# Patient Record
Sex: Female | Born: 1940 | Race: White | Hispanic: No | Smoking: Former smoker
Health system: Southern US, Community
[De-identification: ages and names within clinical notes are randomized; demographics above are authoritative.]

## PROBLEM LIST (undated history)

## (undated) DIAGNOSIS — Z923 Personal history of irradiation: Secondary | ICD-10-CM

## (undated) DIAGNOSIS — I10 Essential (primary) hypertension: Secondary | ICD-10-CM

## (undated) DIAGNOSIS — Z803 Family history of malignant neoplasm of breast: Secondary | ICD-10-CM

## (undated) DIAGNOSIS — C50919 Malignant neoplasm of unspecified site of unspecified female breast: Principal | ICD-10-CM

## (undated) DIAGNOSIS — H3530 Unspecified macular degeneration: Secondary | ICD-10-CM

## (undated) DIAGNOSIS — E7800 Pure hypercholesterolemia, unspecified: Secondary | ICD-10-CM

## (undated) DIAGNOSIS — Z780 Asymptomatic menopausal state: Secondary | ICD-10-CM

## (undated) HISTORY — DX: Essential (primary) hypertension: I10

## (undated) HISTORY — DX: Asymptomatic menopausal state: Z78.0

## (undated) HISTORY — DX: Family history of malignant neoplasm of breast: Z80.3

## (undated) HISTORY — DX: Pure hypercholesterolemia, unspecified: E78.00

## (undated) HISTORY — DX: Unspecified macular degeneration: H35.30

## (undated) HISTORY — DX: Malignant neoplasm of unspecified site of unspecified female breast: C50.919

## (undated) HISTORY — PX: WISDOM TOOTH EXTRACTION: SHX21

---

## 1998-11-11 DIAGNOSIS — E7800 Pure hypercholesterolemia, unspecified: Secondary | ICD-10-CM

## 1998-11-11 HISTORY — DX: Pure hypercholesterolemia, unspecified: E78.00

## 1998-12-15 ENCOUNTER — Encounter: Admission: RE | Admit: 1998-12-15 | Discharge: 1999-03-15 | Payer: Self-pay | Admitting: Geriatric Medicine

## 1999-10-22 ENCOUNTER — Other Ambulatory Visit: Admission: RE | Admit: 1999-10-22 | Discharge: 1999-10-22 | Payer: Self-pay | Admitting: *Deleted

## 2000-01-07 ENCOUNTER — Encounter: Admission: RE | Admit: 2000-01-07 | Discharge: 2000-04-06 | Payer: Self-pay | Admitting: Geriatric Medicine

## 2000-04-15 ENCOUNTER — Other Ambulatory Visit: Admission: RE | Admit: 2000-04-15 | Discharge: 2000-04-15 | Payer: Self-pay | Admitting: *Deleted

## 2000-06-26 ENCOUNTER — Ambulatory Visit (HOSPITAL_COMMUNITY): Admission: RE | Admit: 2000-06-26 | Discharge: 2000-06-26 | Payer: Self-pay | Admitting: Gastroenterology

## 2001-03-25 ENCOUNTER — Other Ambulatory Visit: Admission: RE | Admit: 2001-03-25 | Discharge: 2001-03-25 | Payer: Self-pay | Admitting: *Deleted

## 2002-06-21 ENCOUNTER — Other Ambulatory Visit: Admission: RE | Admit: 2002-06-21 | Discharge: 2002-06-21 | Payer: Self-pay | Admitting: *Deleted

## 2003-10-13 ENCOUNTER — Other Ambulatory Visit: Admission: RE | Admit: 2003-10-13 | Discharge: 2003-10-13 | Payer: Self-pay | Admitting: *Deleted

## 2005-07-16 ENCOUNTER — Other Ambulatory Visit: Admission: RE | Admit: 2005-07-16 | Discharge: 2005-07-16 | Payer: Self-pay | Admitting: *Deleted

## 2008-03-17 ENCOUNTER — Encounter: Admission: RE | Admit: 2008-03-17 | Discharge: 2008-03-17 | Payer: Self-pay | Admitting: Geriatric Medicine

## 2008-03-21 ENCOUNTER — Encounter: Admission: RE | Admit: 2008-03-21 | Discharge: 2008-03-21 | Payer: Self-pay | Admitting: Geriatric Medicine

## 2008-04-05 ENCOUNTER — Encounter: Admission: RE | Admit: 2008-04-05 | Discharge: 2008-04-05 | Payer: Self-pay | Admitting: Geriatric Medicine

## 2008-04-07 ENCOUNTER — Other Ambulatory Visit: Admission: RE | Admit: 2008-04-07 | Discharge: 2008-04-07 | Payer: Self-pay | Admitting: Obstetrics and Gynecology

## 2009-04-11 DIAGNOSIS — C50919 Malignant neoplasm of unspecified site of unspecified female breast: Secondary | ICD-10-CM

## 2009-04-11 HISTORY — PX: BREAST LUMPECTOMY: SHX2

## 2009-04-11 HISTORY — DX: Malignant neoplasm of unspecified site of unspecified female breast: C50.919

## 2009-04-14 ENCOUNTER — Encounter: Admission: RE | Admit: 2009-04-14 | Discharge: 2009-04-14 | Payer: Self-pay | Admitting: Geriatric Medicine

## 2009-04-14 ENCOUNTER — Encounter (INDEPENDENT_AMBULATORY_CARE_PROVIDER_SITE_OTHER): Payer: Self-pay | Admitting: Diagnostic Radiology

## 2009-04-23 ENCOUNTER — Encounter: Admission: RE | Admit: 2009-04-23 | Discharge: 2009-04-23 | Payer: Self-pay | Admitting: Obstetrics & Gynecology

## 2009-05-11 ENCOUNTER — Ambulatory Visit (HOSPITAL_BASED_OUTPATIENT_CLINIC_OR_DEPARTMENT_OTHER): Admission: RE | Admit: 2009-05-11 | Discharge: 2009-05-11 | Payer: Self-pay | Admitting: General Surgery

## 2009-05-11 ENCOUNTER — Encounter: Admission: RE | Admit: 2009-05-11 | Discharge: 2009-05-11 | Payer: Self-pay | Admitting: General Surgery

## 2009-05-11 ENCOUNTER — Encounter (INDEPENDENT_AMBULATORY_CARE_PROVIDER_SITE_OTHER): Payer: Self-pay | Admitting: General Surgery

## 2009-05-17 ENCOUNTER — Ambulatory Visit: Payer: Self-pay | Admitting: Hematology & Oncology

## 2009-06-01 ENCOUNTER — Ambulatory Visit: Payer: Self-pay | Admitting: Hematology & Oncology

## 2009-06-01 LAB — CBC WITH DIFFERENTIAL (CANCER CENTER ONLY)
BASO%: 0.5 % (ref 0.0–2.0)
Eosinophils Absolute: 0.4 10*3/uL (ref 0.0–0.5)
LYMPH#: 1.7 10*3/uL (ref 0.9–3.3)
MCV: 93 fL (ref 81–101)
MONO#: 0.5 10*3/uL (ref 0.1–0.9)
NEUT#: 4.6 10*3/uL (ref 1.5–6.5)
Platelets: 287 10*3/uL (ref 145–400)
RBC: 4.15 10*6/uL (ref 3.70–5.32)
RDW: 11.1 % (ref 10.5–14.6)
WBC: 7.3 10*3/uL (ref 3.9–10.0)

## 2009-06-02 LAB — COMPREHENSIVE METABOLIC PANEL
ALT: 21 U/L (ref 0–35)
AST: 27 U/L (ref 0–37)
Alkaline Phosphatase: 51 U/L (ref 39–117)
Chloride: 102 mEq/L (ref 96–112)
Creatinine, Ser: 0.76 mg/dL (ref 0.40–1.20)
Total Bilirubin: 0.5 mg/dL (ref 0.3–1.2)

## 2009-06-29 LAB — CBC WITH DIFFERENTIAL (CANCER CENTER ONLY)
BASO%: 0.5 % (ref 0.0–2.0)
EOS%: 5.6 % (ref 0.0–7.0)
Eosinophils Absolute: 0.4 10*3/uL (ref 0.0–0.5)
LYMPH%: 21.2 % (ref 14.0–48.0)
MCH: 31.8 pg (ref 26.0–34.0)
MONO%: 7.4 % (ref 0.0–13.0)
NEUT#: 4.4 10*3/uL (ref 1.5–6.5)
Platelets: 256 10*3/uL (ref 145–400)
RBC: 4.18 10*6/uL (ref 3.70–5.32)
RDW: 11.8 % (ref 10.5–14.6)
WBC: 6.8 10*3/uL (ref 3.9–10.0)

## 2009-06-29 LAB — CMP (CANCER CENTER ONLY)
ALT(SGPT): 24 U/L (ref 10–47)
AST: 34 U/L (ref 11–38)
Albumin: 3.3 g/dL (ref 3.3–5.5)
BUN, Bld: 13 mg/dL (ref 7–22)
Calcium: 10.3 mg/dL (ref 8.0–10.3)
Chloride: 106 mEq/L (ref 98–108)
Potassium: 4.2 mEq/L (ref 3.3–4.7)
Total Protein: 6.1 g/dL — ABNORMAL LOW (ref 6.4–8.1)

## 2009-08-09 ENCOUNTER — Ambulatory Visit: Payer: Self-pay | Admitting: Hematology & Oncology

## 2009-08-10 LAB — CBC WITH DIFFERENTIAL (CANCER CENTER ONLY)
BASO#: 0 10*3/uL (ref 0.0–0.2)
EOS%: 4.4 % (ref 0.0–7.0)
LYMPH%: 16.5 % (ref 14.0–48.0)
MCH: 32.5 pg (ref 26.0–34.0)
MCHC: 34.6 g/dL (ref 32.0–36.0)
MCV: 94 fL (ref 81–101)
MONO%: 6.1 % (ref 0.0–13.0)
NEUT#: 4.4 10*3/uL (ref 1.5–6.5)
Platelets: 223 10*3/uL (ref 145–400)

## 2009-08-11 LAB — COMPREHENSIVE METABOLIC PANEL
AST: 29 U/L (ref 0–37)
Alkaline Phosphatase: 59 U/L (ref 39–117)
BUN: 14 mg/dL (ref 6–23)
Calcium: 10.3 mg/dL (ref 8.4–10.5)
Chloride: 102 mEq/L (ref 96–112)
Creatinine, Ser: 0.69 mg/dL (ref 0.40–1.20)

## 2009-09-19 ENCOUNTER — Ambulatory Visit: Payer: Self-pay | Admitting: Hematology & Oncology

## 2009-09-20 LAB — COMPREHENSIVE METABOLIC PANEL
AST: 27 U/L (ref 0–37)
Albumin: 4.5 g/dL (ref 3.5–5.2)
Alkaline Phosphatase: 57 U/L (ref 39–117)
Potassium: 4.4 mEq/L (ref 3.5–5.3)
Sodium: 139 mEq/L (ref 135–145)
Total Protein: 6.7 g/dL (ref 6.0–8.3)

## 2009-09-20 LAB — CBC WITH DIFFERENTIAL (CANCER CENTER ONLY)
BASO#: 0.1 10*3/uL (ref 0.0–0.2)
EOS%: 5.8 % (ref 0.0–7.0)
Eosinophils Absolute: 0.4 10*3/uL (ref 0.0–0.5)
HCT: 39.5 % (ref 34.8–46.6)
HGB: 13.6 g/dL (ref 11.6–15.9)
MCH: 31.7 pg (ref 26.0–34.0)
MCHC: 34.5 g/dL (ref 32.0–36.0)
MCV: 92 fL (ref 81–101)
MONO%: 6.9 % (ref 0.0–13.0)
NEUT%: 69.2 % (ref 39.6–80.0)

## 2009-12-28 ENCOUNTER — Ambulatory Visit: Payer: Self-pay | Admitting: Hematology & Oncology

## 2010-01-08 LAB — CBC WITH DIFFERENTIAL (CANCER CENTER ONLY)
BASO%: 0.9 % (ref 0.0–2.0)
Eosinophils Absolute: 0.6 10*3/uL — ABNORMAL HIGH (ref 0.0–0.5)
LYMPH%: 19.8 % (ref 14.0–48.0)
MCH: 31.5 pg (ref 26.0–34.0)
MCV: 94 fL (ref 81–101)
MONO%: 8 % (ref 0.0–13.0)
Platelets: 278 10*3/uL (ref 145–400)
RDW: 11.4 % (ref 10.5–14.6)

## 2010-03-27 ENCOUNTER — Ambulatory Visit: Payer: Self-pay | Admitting: Hematology & Oncology

## 2010-04-25 IMAGING — MG MM DIAGNOSTIC BILATERAL
7 series · 7 of 7 positions shown · non-contrast
Comparison: 03/17/2008

CLINICAL DATA: Questioned palpable finding right upper outer
quadrant.

DIGITAL DIAGNOSTIC  BILATERAL  MAMMOGRAM  WITH CAD AND RIGHT BREAST
ULTRASOUND:

[R CC]
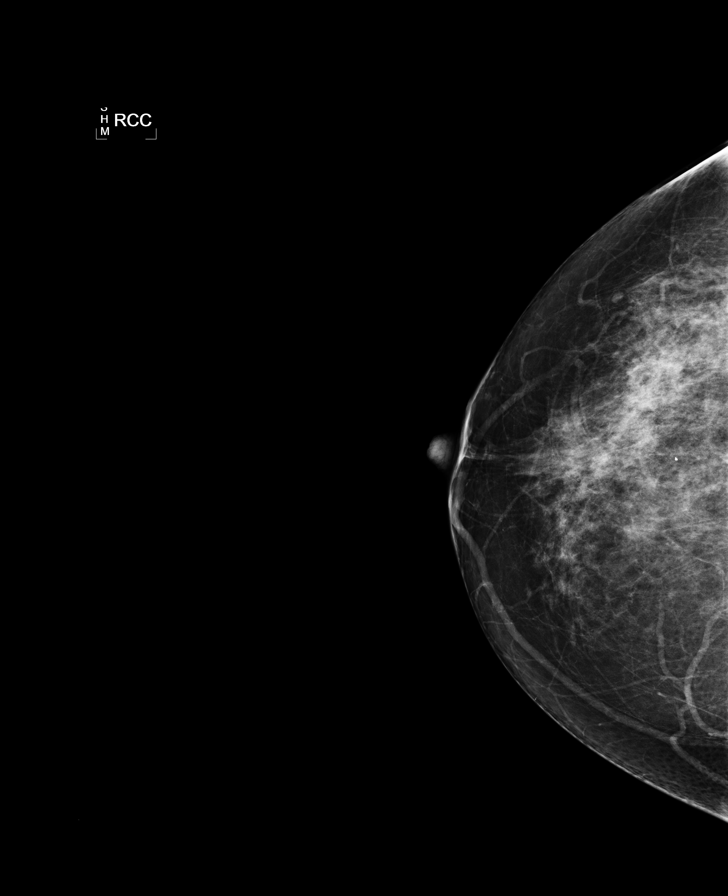

[L CC (1 of 2)]
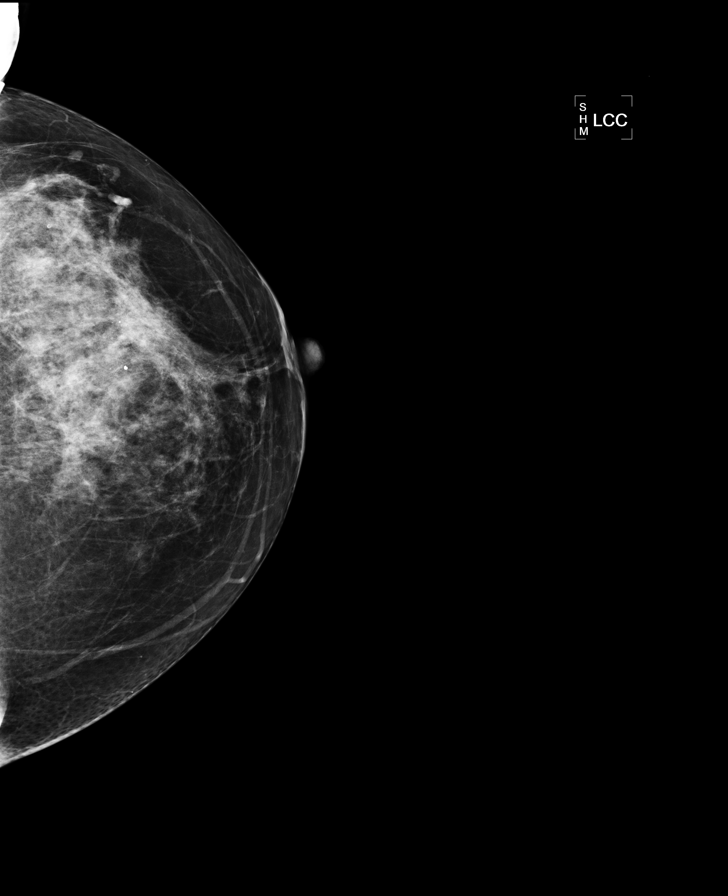

[L MLO]
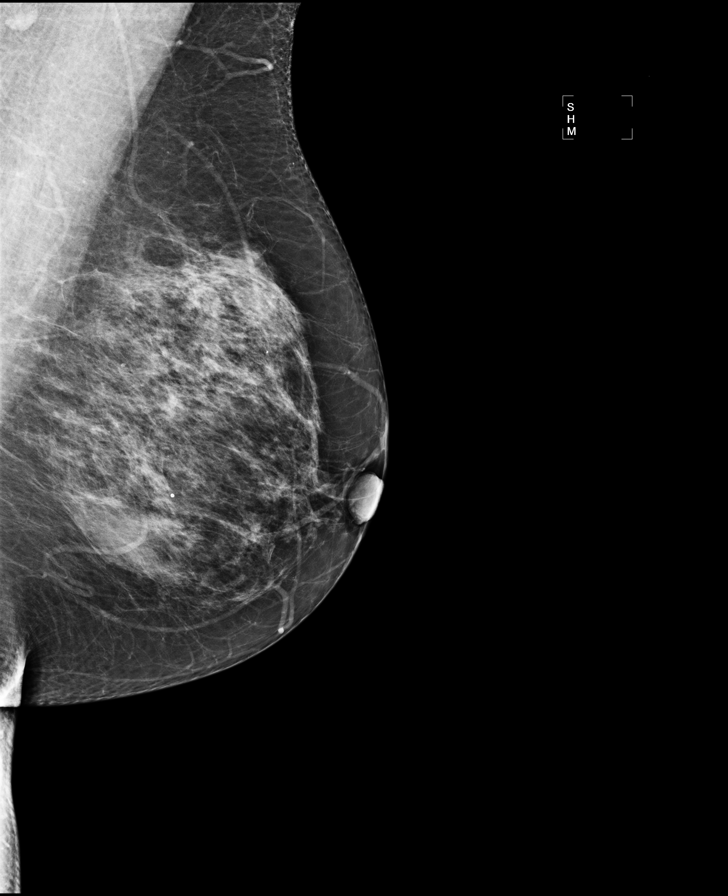

[R MLO]
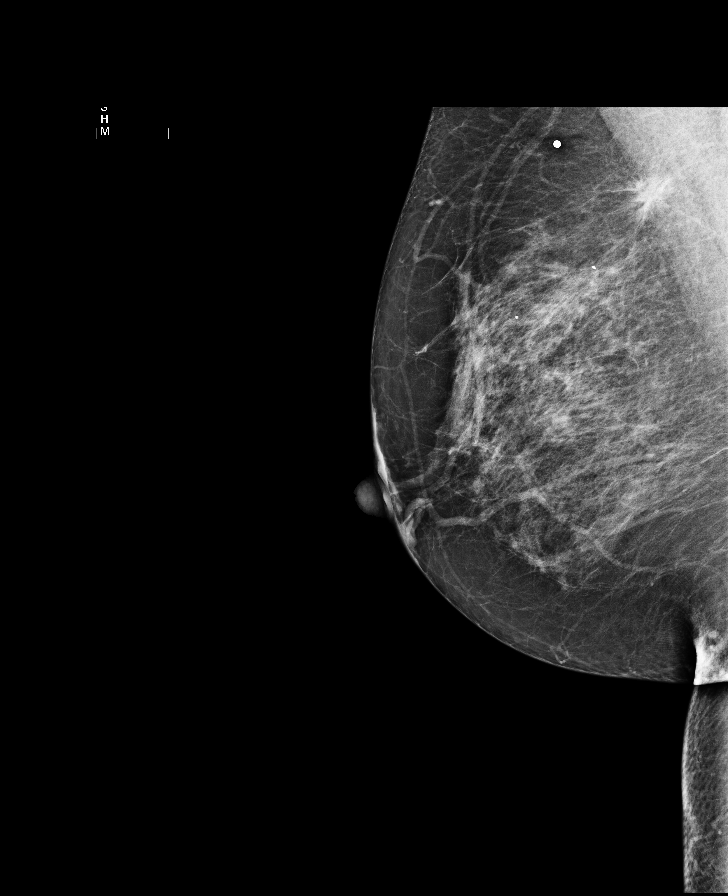

[R TAN]
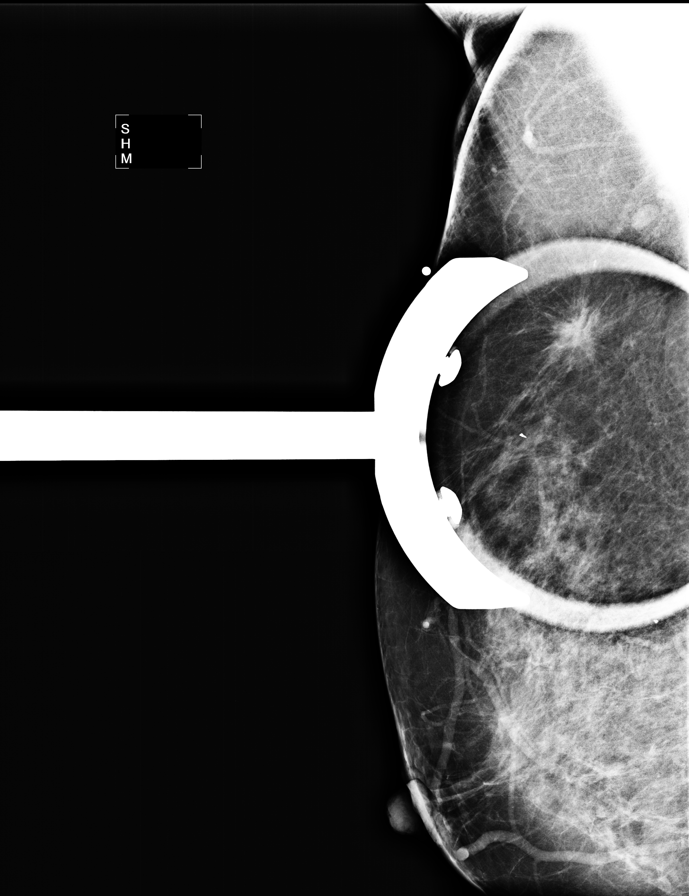

[R XCCL]
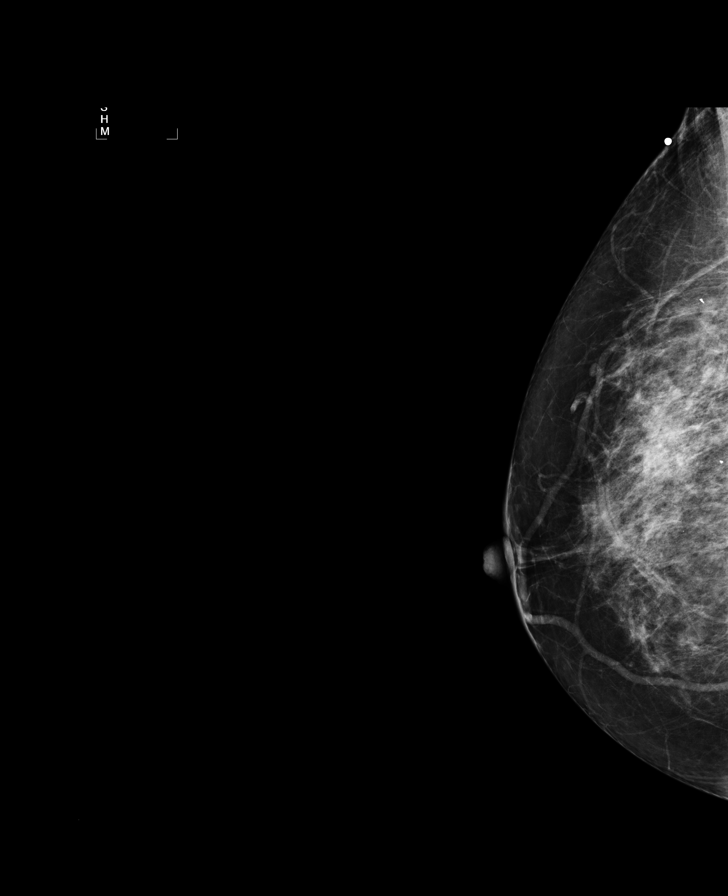

[L CC (2 of 2)]
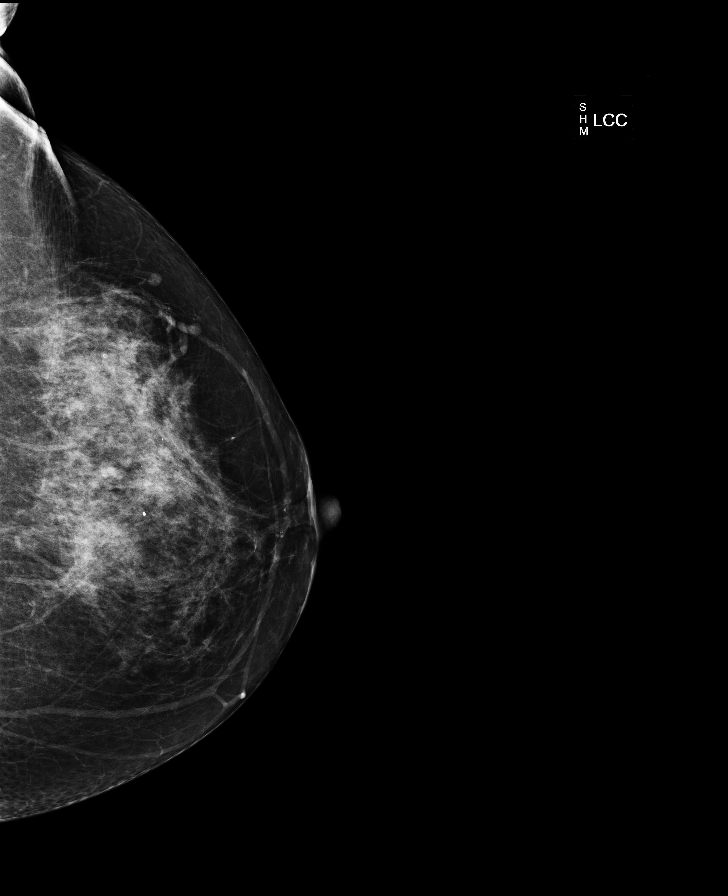

[7 of 7 positions shown; findings below may reference images not displayed]

FINDINGS: The breast parenchyma is heterogeneously dense.  There is
a spiculated mass in the right upper outer quadrant, corresponding
to the questioned palpable finding as indicated by a BB.  No
suspicious mass, calcification, or architectural distortion is seen
in the left breast.

On physical exam, I palpate a mobile mass in the right breast 10
o'clock location 7 cm from the nipple at the site of the patient's
questioned palpable finding.

Ultrasound is performed, showing a spiculated hypoechoic shadowing
mass in this location measuring 1.3 x 1.0 x 1.0 cm.  Ultrasound of
the right axilla demonstrates no lymphadenopathy.
IMPRESSION: Suspicious right breast mass.  Ultrasound-guided core biopsy will
be performed and dictated separately. Findings and recommendations
discussed with the patient and provided in written form at the time
of the exam. A message regarding these findings was left for Dr.
Grimaldi by Dr. Kasumu on 04/14/2009 at [DATE] a.m.

No mammographic evidence for malignancy in the left breast.

BI-RADS CATEGORY 5:  Highly suggestive of malignancy - appropriate
action should be taken.

Recommendation:  Ultrasound guided core biopsy right breast

## 2010-04-27 ENCOUNTER — Encounter: Admission: RE | Admit: 2010-04-27 | Discharge: 2010-04-27 | Payer: Self-pay | Admitting: General Surgery

## 2010-06-15 ENCOUNTER — Ambulatory Visit: Payer: Self-pay | Admitting: Hematology & Oncology

## 2010-06-18 LAB — CBC WITH DIFFERENTIAL (CANCER CENTER ONLY)
EOS%: 7.8 % — ABNORMAL HIGH (ref 0.0–7.0)
MCH: 31.1 pg (ref 26.0–34.0)
MCHC: 33.9 g/dL (ref 32.0–36.0)
MONO%: 6.8 % (ref 0.0–13.0)
NEUT#: 5 10*3/uL (ref 1.5–6.5)
Platelets: 221 10*3/uL (ref 145–400)
RBC: 4.3 10*6/uL (ref 3.70–5.32)

## 2010-06-18 LAB — CMP (CANCER CENTER ONLY)
AST: 29 U/L (ref 11–38)
Albumin: 4.1 g/dL (ref 3.3–5.5)
Alkaline Phosphatase: 51 U/L (ref 26–84)
Potassium: 4.7 mEq/L (ref 3.3–4.7)
Sodium: 136 mEq/L (ref 128–145)
Total Protein: 6.7 g/dL (ref 6.4–8.1)

## 2010-08-06 ENCOUNTER — Ambulatory Visit: Payer: Self-pay | Admitting: Hematology & Oncology

## 2010-12-02 ENCOUNTER — Encounter: Payer: Self-pay | Admitting: Geriatric Medicine

## 2010-12-19 ENCOUNTER — Other Ambulatory Visit: Payer: Self-pay | Admitting: Family

## 2010-12-19 ENCOUNTER — Encounter (HOSPITAL_BASED_OUTPATIENT_CLINIC_OR_DEPARTMENT_OTHER): Payer: 59 | Admitting: Hematology & Oncology

## 2010-12-19 ENCOUNTER — Other Ambulatory Visit: Payer: Self-pay | Admitting: Hematology & Oncology

## 2010-12-19 DIAGNOSIS — Z9889 Other specified postprocedural states: Secondary | ICD-10-CM

## 2010-12-19 DIAGNOSIS — C50419 Malignant neoplasm of upper-outer quadrant of unspecified female breast: Secondary | ICD-10-CM

## 2010-12-19 LAB — BASIC METABOLIC PANEL - CANCER CENTER ONLY
CO2: 28 mEq/L (ref 18–33)
Calcium: 9.6 mg/dL (ref 8.0–10.3)
Creat: 0.7 mg/dl (ref 0.6–1.2)

## 2011-02-17 LAB — GLUCOSE, CAPILLARY
Glucose-Capillary: 319 mg/dL — ABNORMAL HIGH (ref 70–99)
Glucose-Capillary: 437 mg/dL — ABNORMAL HIGH (ref 70–99)

## 2011-02-17 LAB — POCT I-STAT, CHEM 8
BUN: 22 mg/dL (ref 6–23)
Calcium, Ion: 1.17 mmol/L (ref 1.12–1.32)
TCO2: 19 mmol/L (ref 0–100)

## 2011-02-18 LAB — COMPREHENSIVE METABOLIC PANEL
Albumin: 4.2 g/dL (ref 3.5–5.2)
BUN: 6 mg/dL (ref 6–23)
Calcium: 9.7 mg/dL (ref 8.4–10.5)
Chloride: 103 mEq/L (ref 96–112)
Creatinine, Ser: 0.58 mg/dL (ref 0.4–1.2)
Potassium: 4.6 mEq/L (ref 3.5–5.1)
Sodium: 138 mEq/L (ref 135–145)
Total Bilirubin: 0.8 mg/dL (ref 0.3–1.2)
Total Protein: 7 g/dL (ref 6.0–8.3)

## 2011-02-18 LAB — URINALYSIS, ROUTINE W REFLEX MICROSCOPIC
Glucose, UA: NEGATIVE mg/dL
Ketones, ur: NEGATIVE mg/dL
Specific Gravity, Urine: 1.007 (ref 1.005–1.030)
pH: 6 (ref 5.0–8.0)

## 2011-02-18 LAB — CBC
Hemoglobin: 14.3 g/dL (ref 12.0–15.0)
MCHC: 33.8 g/dL (ref 30.0–36.0)
RBC: 4.38 MIL/uL (ref 3.87–5.11)
WBC: 8.4 10*3/uL (ref 4.0–10.5)

## 2011-02-18 LAB — DIFFERENTIAL
Basophils Absolute: 0 10*3/uL (ref 0.0–0.1)
Lymphocytes Relative: 22 % (ref 12–46)
Lymphs Abs: 1.8 10*3/uL (ref 0.7–4.0)
Monocytes Absolute: 0.7 10*3/uL (ref 0.1–1.0)
Monocytes Relative: 9 % (ref 3–12)
Neutro Abs: 5.5 10*3/uL (ref 1.7–7.7)

## 2011-02-18 LAB — CANCER ANTIGEN 27.29: CA 27.29: 25 U/mL (ref 0–39)

## 2011-03-26 NOTE — Op Note (Signed)
NAMEKAMBREY, HAGGER               ACCOUNT NO.:  [id number]   MEDICAL RECORD NO.:  [id number]          PATIENT TYPE:  AMB   LOCATION:  [facility]SC                          FACILITY:  [facility]   PHYSICIAN:  Angelia Mould. Derrell Lolling, M.D.DATE OF BIRTH:  02-Jun-1941   DATE OF PROCEDURE:  05/11/2009  DATE OF DISCHARGE:                               OPERATIVE REPORT   PREOPERATIVE DIAGNOSIS:  Invasive ductal carcinoma, right breast,  clinical stage T1c N0.   POSTOPERATIVE DIAGNOSIS:  Invasive ductal carcinoma, right breast,  clinical stage T1c N0.   OPERATION PERFORMED:  1. Injected blue dye, right breast.  2. Right partial mastectomy with needle localization.  3. Right axillary sentinel node mapping and biopsy.   SURGEON:  Angelia Mould. Derrell Lolling, MD   OPERATIVE INDICATIONS AND TECHNIQUE:  This is a 70 year old Caucasian  female who recently felt a lump in the lateral aspect of her right  breast.  Imaging studies were performed and it had a focal mass in the  right breast laterally.  The axilla looked normal.  Image-guided biopsy  showed an intermediate to low-grade invasive ductal carcinoma with  strongly positive hormone receptors, Her-2 negative.  MRI showed a 1.9-  cm enhancing mass in the axillary tail of right breast corresponding to  the biopsy-proven invasive cancer, but no other abnormalities were  found.  The patient was strongly motivated towards breast conservation  surgery.  She underwent needle localization this morning at the [facility] [place] and the wire position was good just anterior to the  cancer.  She was brought to [facility] where she underwent  injection of radionuclide into the right breast by the nuclear medicine  technician.  He was then brought to the operating room and a general  anesthetic induced.  The patient was identified as the correct patient  and correct procedure and correct site.  Following an alcohol prep, I  injected 5 mL of blue dye in the  right breast subareolar area.  This was  2 mL of methylene blue mixed with 3 mL of saline.  The breast was  massaged for 5 minutes.  We then prepped and draped the right breast,  right axilla, right chest wall, and shoulder.  Marcaine 0.5% with  epinephrine was used as a local infiltration anesthetic.   I reviewed the wire localization films.  I observed the localizing wire  entering the right breast in the far lateral position and directed  medially.  I felt that I can just palpate the cancer at about the 9  o'clock position of the right breast laterally.  I felt that a radial  ellipse incision would be the best incision cosmetically.  I marked a  radial ellipse at about the 9 o'clock position, which encompassed the  wire and the skin overlying the cancer.  The incision was made.  I  dissected widely around the tumor all the way down to the chest wall,  lateral pectoralis fascia, and the serratus anterior muscle.  The  specimen was removed.  It was marked with the six-color margin marker  kit.  Specimen mammogram was done and discussed with Dr. Christiana Pellant.  Dr. Chilton Si stated that the cancer appeared to be in the center of the  specimen with good margins all around.  This was sent for routine  histology.  The right breast incision was irrigated with saline.  Hemostasis was excellent.  I undermined the breast tissue superiorly and  inferiorly and I then reconstructed the breast tissue with 2 layers of  interrupted sutures of 3-0 Vicryl and closed the skin with running  subcuticular suture of 4-0 Monocryl and Steri-Strips.   I used the NeoProbe.  The radioactivity in the right axilla was fairly  feeble.  I made a transverse incision just above the hairline.  Dissection was carried down through the subcutaneous tissue and I  incised the clavipectoral fascia.  I was able to trace out using the  blue lymphatics and found one very blue lymph node.  Once I removed  this, it actually had a  fair amount of radioactivity in it.  There was  no other sentinel node found.  Dr. Dierdre Searles performed imprint cytology on this  and called back and told me that there were no cancer cells.  The right  axillary incision was closed in layers with 3-0 Vicryl and 4-0 Monocryl  on the skin and Steri-Strips.  Clean bandages were placed.  The patient  was taken to recovery room in stable condition.  Estimated blood loss  was about 20 mL or less.  Sponge, needle, and instrument counts were  correct.      Angelia Mould. Derrell Lolling, M.D.  Electronically Signed     HMI/MEDQ  D:  05/11/2009  T:  05/12/2009  Job:  [id number]   cc:   Harrel Lemon, MD  Hal T. Stoneking, M.D.  Normajean Glasgow, MD

## 2011-03-29 NOTE — Procedures (Signed)
[facility]  Patient:    Vickie Burns, Vickie Burns                        MRN: [id number] Proc. Date: 06/26/00 Attending:  Verlin Grills, M.D. CC:         Hal T. Stoneking, M.D.                           Procedure Report  PROCEDURE:  Colonoscopy and hot polyp biopsy.  ENDOSCOPIST:  Verlin Grills, M.D.  REFERRING PHYSICIAN:  Hal T. Pete Glatter, M.D., [facility].  INDICATIONS:  Ms. Vickie Burns is a 70 year old female.  She underwent her health maintenance flexible proctosigmoidoscopy a few weeks ago and a small adenomatous polyp was present in the proximal rectum.  I discussed with Vickie Burns the complications associated with colonoscopy and polypectomy including intestinal bleeding and intestinal perforation.  Vickie Burns has signed the operative permit.  PREMEDICATION:  Fentanyl 75 mcg, Versed 10 mg.  ENDOSCOPE:  Olympus pediatric colonoscope.  DESCRIPTION OF PROCEDURE:  After obtaining informed consent, the patient was placed in the left lateral decubitus position, administered intravenous Demerol and intravenous Versed to achieve conscious sedation for the procedure.  The patients blood pressure, oxygen saturation and cardiac rhythm were monitored throughout the procedure and documented in the medical record.  Anal inspection was normal.  Digital rectal exam was normal.  The Olympus pediatric colonoscope was introduced into the rectum and under direct vision advanced to the cecum as identified by normal-appearing ileocecal valve. Colonic preparation for the exam today was excellent.  Rectum:  From the proximal rectum at approximately 15 cm from the anal verge, three 1 mm sessile polyps were removed with the hot biopsy forceps and cold biopsy forceps.  All specimens were submitted in one bottle for pathological evaluation.  Sigmoid colon and descending colon:  Extensive left colonic diverticulosis.  Splenic flexure:   Normal.  Transverse colon:  Normal.  Hepatic flexure:  Normal.  Ascending colon: Normal.  Cecum and ileocecal valve: Normal.  ASSESSMENT: 1. Left colonic diverticulosis. 2. From the proximal rectum, three 1 mm sessile polyps were removed with the    biopsy forceps and submitted for pathologic interpretation.  RECOMMENDATIONS:  Repeat colonoscopy in three to five years. DD:  06/26/00 TD:  06/27/00 Job: [id number]

## 2011-04-05 ENCOUNTER — Encounter (INDEPENDENT_AMBULATORY_CARE_PROVIDER_SITE_OTHER): Payer: Self-pay | Admitting: General Surgery

## 2011-04-15 ENCOUNTER — Other Ambulatory Visit: Payer: Self-pay | Admitting: Gastroenterology

## 2011-04-29 ENCOUNTER — Ambulatory Visit
Admission: RE | Admit: 2011-04-29 | Discharge: 2011-04-29 | Disposition: A | Discharge status: Home / Self Care | Payer: 59 | Source: Ambulatory Visit | Attending: Hematology & Oncology | Admitting: Hematology & Oncology

## 2011-04-29 DIAGNOSIS — Z9889 Other specified postprocedural states: Secondary | ICD-10-CM

## 2011-06-19 ENCOUNTER — Encounter (HOSPITAL_BASED_OUTPATIENT_CLINIC_OR_DEPARTMENT_OTHER): Payer: 59 | Admitting: Hematology & Oncology

## 2011-06-19 ENCOUNTER — Other Ambulatory Visit: Payer: Self-pay | Admitting: Family

## 2011-06-19 DIAGNOSIS — C50419 Malignant neoplasm of upper-outer quadrant of unspecified female breast: Secondary | ICD-10-CM

## 2011-06-19 DIAGNOSIS — Z23 Encounter for immunization: Secondary | ICD-10-CM

## 2011-06-19 LAB — BASIC METABOLIC PANEL - CANCER CENTER ONLY
BUN, Bld: 12 mg/dL (ref 7–22)
Calcium: 10.1 mg/dL (ref 8.0–10.3)
Creat: 0.8 mg/dl (ref 0.6–1.2)

## 2011-06-27 ENCOUNTER — Encounter (INDEPENDENT_AMBULATORY_CARE_PROVIDER_SITE_OTHER): Payer: Self-pay | Admitting: General Surgery

## 2011-06-27 ENCOUNTER — Ambulatory Visit (INDEPENDENT_AMBULATORY_CARE_PROVIDER_SITE_OTHER): Payer: 59 | Admitting: General Surgery

## 2011-06-27 VITALS — BP 128/80 | Temp 97.5°F | Wt 234.4 lb

## 2011-06-27 DIAGNOSIS — Z853 Personal history of malignant neoplasm of breast: Secondary | ICD-10-CM

## 2011-06-27 NOTE — Patient Instructions (Signed)
Your physical exam today is normal. your mammograms on April 29, 2011 are normal. There is no evidence of any breast cancer,, and I believe that your cancer free. Continue the medications that Dr. Myna Hidalgo has you on. See him regularly. I will see you back in one year. Please schedule bilateral mammograms in June of 2013.

## 2011-06-27 NOTE — Progress Notes (Signed)
Chief Complaint  Patient presents with  . Other    Breast cancer - 6 month follow up    HPI Vickie Burns is a 70 y.o. female.  She was diagnosed with receptor positive, HER-2-negative cancer of the right breast, upper outer quadrant in June of 2010. She underwent right partial mastectomy, right SLN biopsy. Her final pathology showed a T1 C., N0 tumor. She has no known recurrence to date.  She sees Dr. Myna Hidalgo very 6 months. She is taking Femara and gets a zometa injection every 6 months.  Last mammograms were April 29, 2011. These showed benign lumpectomy changes, BI-RADS category 2.  The patient is asymptomatic. She has no complaints about her breast. Her general health has been stable.HPI  Past Medical History  Diagnosis Date  . Family history of breast cancer     aunt    History reviewed. No pertinent past surgical history.  Family History  Problem Relation Age of Onset  . Heart disease Mother     Confestive Heart Failure  . Diabetes Father   . Heart disease Father     Congestive Heart Failure    Social History History  Substance Use Topics  . Smoking status: Never Smoker   . Smokeless tobacco: Not on file  . Alcohol Use: No    No Known Allergies  Current Outpatient Prescriptions  Medication Sig Dispense Refill  . aspirin 81 MG tablet Take 81 mg by mouth daily.        . Cholecalciferol (VITAMIN D-3 PO) Take 2,000 Units by mouth. 1 am daily       . Cyanocobalamin (B-12 PO) Take 1,000 mg by mouth. 1 am daily       . ezetimibe (ZETIA) 10 MG tablet Take 10 mg by mouth daily.        Marland Kitchen gabapentin (NEURONTIN) 300 MG capsule Take 300 mg by mouth 2 (two) times daily. Patient takes 600 total in morning and again in afternoon.       . Insulin Aspart Prot & Aspart (NOVOLOG MIX 70/30 FLEXPEN [place]) Inject 35 Units into the skin 2 (two) times daily. Takes in morning and again in afternoon       . lisinopril (PRINIVIL,ZESTRIL) 2.5 MG tablet Take 2.5 mg by mouth daily. Am daily        . METFORMIN HCL PO Take 500 mg by mouth. 2 am , 2 pm daily       . Multiple Vitamins-Minerals (CENTRUM SILVER PO) Take by mouth 2 (two) times daily. 1/2 in am , 1/2 in pm       . Omega-3 Fatty Acids (FISH OIL PO) Take 1,200 mg by mouth. 3 am  3 pm daily       . rosuvastatin (CRESTOR) 40 MG tablet Take 40 mg by mouth daily.        Marland Kitchen letrozole (FEMARA) 2.5 MG tablet Take 2.5 mg by mouth daily.          Review of Systems ROS  Blood pressure 128/80, temperature 97.5 F (36.4 C), temperature source Temporal, weight 234 lb 6.4 oz (106.323 kg).  Physical Exam Physical Exam  Patient looks well. She is in no distress.  Neck no adenopathy. No mass. No jugular venous distention.  Lungs are clear auscultation. No chest wall tenderness or chest nodules.  Breast - right breast reveals well healed radially oriented scar laterally and well-healed axillary scar. She also has an old breast biopsy scar superiorly. There are no skin changes. There  is no palpable mass. No axillary adenopathy. Left breast reveals no mass, no skin changes, no axillary adenopathy. Data Reviewed I reviewed Dr. Gustavo Lah office notes and her recent mammogram. Assessment    Invasive ductal carcinoma right breast, upper outer quadrant, pathologic stage TI C., N0, receptor positive, HER-2-negative. No evidence of recurrence 2 years following right partial mastectomy and right axillary sentinel lymph biopsy.  Status post adjuvant radiation therapy.  Undergoing adjuvant hormonal therapy.    Plan    She will followup with Dr. Myna Hidalgo every 6 months.  She will get bilateral mammograms in June of 2013.  Return to see me in one year.       Meeghan Skipper M 06/27/2011, 10:30 AM

## 2011-12-19 ENCOUNTER — Other Ambulatory Visit: Payer: Self-pay | Admitting: Obstetrics & Gynecology

## 2011-12-19 DIAGNOSIS — N63 Unspecified lump in unspecified breast: Secondary | ICD-10-CM

## 2011-12-20 ENCOUNTER — Other Ambulatory Visit (HOSPITAL_BASED_OUTPATIENT_CLINIC_OR_DEPARTMENT_OTHER): Payer: 59 | Admitting: Lab

## 2011-12-20 ENCOUNTER — Ambulatory Visit: Payer: 59 | Admitting: Family

## 2011-12-20 ENCOUNTER — Encounter: Payer: Self-pay | Admitting: Hematology & Oncology

## 2011-12-20 ENCOUNTER — Other Ambulatory Visit: Payer: 59 | Admitting: Lab

## 2011-12-20 ENCOUNTER — Ambulatory Visit (HOSPITAL_BASED_OUTPATIENT_CLINIC_OR_DEPARTMENT_OTHER): Payer: 59 | Admitting: Hematology & Oncology

## 2011-12-20 VITALS — BP 147/75 | HR 72 | Temp 97.7°F | Ht 69.75 in | Wt 241.0 lb

## 2011-12-20 DIAGNOSIS — C50419 Malignant neoplasm of upper-outer quadrant of unspecified female breast: Secondary | ICD-10-CM

## 2011-12-20 DIAGNOSIS — C50919 Malignant neoplasm of unspecified site of unspecified female breast: Secondary | ICD-10-CM | POA: Insufficient documentation

## 2011-12-20 DIAGNOSIS — Z170 Estrogen receptor positive status [ER+]: Secondary | ICD-10-CM

## 2011-12-20 NOTE — Progress Notes (Signed)
This office note has been dictated.

## 2011-12-20 NOTE — Progress Notes (Signed)
CC:   Angelia Mould. Derrell Lolling, M.D. Hal T. Stoneking, M.D. Lance Bosch, MD  DIAGNOSIS:  Stage I (T1c N0 M0) ductal carcinoma of the right breast.  CURRENT THERAPY: 1. Femara 2.5 mg p.o. daily. 2. Zometa 4 mg IV q. year.  INTERVAL HISTORY:  Ms. Suares comes in for followup.  She is going to go undergo mammogram and ultrasound in a week or so.  She says she found a lump in the superior aspect of the right breast.  She went to go see her gynecologist.  She says her gynecologist noted a nodule in the lateral aspect of the right breast.  Her last mammogram was done back in June.  Everything looked okay with this, with no suspicious findings.  Ms. Caraway feels well.  She is still working.  She does have some hot flashes.  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  Her son and daughter-in-law are going to have their 1st child next week. She is looking forward to going down to [place] to be with them.  She has had no fever.  There has been no bony pain.  She has had no cough.  There has been no change in bowel or bladder habits.  There has been no leg swelling.  She has had no rashes.  PHYSICAL EXAM:  General: This is a well-developed and well-nourished white female in no obvious distress.  Vital signs: 97.7, pulse 72, respiratory rate 114, blood pressure 147/75, and weight is 241.  Head and neck exam shows a normocephalic, atraumatic skull.  There are no ocular or oral lesions.  There are no palpable cervical or supraclavicular lymph nodes.  Lungs are clear bilaterally.  Cardiac exam: Regular rate and rhythm with a normal S1, S2.  There are no murmurs, rubs, or bruits. Breast exam: Shows left breast with no masses, edema, or erythema. There is no left axillary adenopathy.  Right breast shows a well-healed lumpectomy at the 10 o'clock position.  There is some slight contraction of the right breast.  I really cannot detect an obvious nodule in the right breast.  There is no nipple  discharge. There is no right axillary adenopathy.  Abdominal exam: Soft with good bowel sounds.  There is no palpable abdominal mass.  There is no fluid wave.  There is no palpable hepatosplenomegaly.  Back exam:  No tenderness over the spine, ribs, or hips.  Extremities: Shows no clubbing, cyanosis, or edema.  LABORATORY STUDIES:  Show electrolytes to be all within normal limits.  IMPRESSION:  Ms. Gick is a 71 year old white female with stage I infiltrating ductal carcinoma of the right breast.  Her tumor is ER positive.  She is on letrozole.  She was diagnosed back in June 2010. She was treated with radiation.  She did not need any chemotherapy.  We will see what the mammogram shows.  Hopefully, this will not show anything suspicious.  I could not feel anything myself when I examined her today.  We will plan to see her back for her visit in 6 months.  We will do Zometa on her when we see her back.   ______________________________ Josph Macho, M.D. PRE/MEDQ  D:  12/20/2011  T:  12/20/2011  Job:  [id number]

## 2011-12-21 LAB — VITAMIN D 25 HYDROXY (VIT D DEFICIENCY, FRACTURES): Vit D, 25-Hydroxy: 65 ng/mL (ref 30–89)

## 2011-12-26 ENCOUNTER — Ambulatory Visit
Admission: RE | Admit: 2011-12-26 | Discharge: 2011-12-26 | Disposition: A | Discharge status: Home / Self Care | Payer: 59 | Source: Ambulatory Visit | Attending: Obstetrics & Gynecology | Admitting: Obstetrics & Gynecology

## 2011-12-26 DIAGNOSIS — N63 Unspecified lump in unspecified breast: Secondary | ICD-10-CM

## 2011-12-31 ENCOUNTER — Encounter: Payer: Self-pay | Admitting: *Deleted

## 2011-12-31 NOTE — Progress Notes (Signed)
Per Dr. Myna Hidalgo, pt called and told her Vit D is good, to keep taking what she has been taking.  Pt voiced understanding.

## 2012-01-09 ENCOUNTER — Telehealth: Payer: Self-pay | Admitting: Hematology & Oncology

## 2012-01-09 NOTE — Telephone Encounter (Signed)
Faxed records to [facility] in response to request.

## 2012-01-27 DIAGNOSIS — E119 Type 2 diabetes mellitus without complications: Secondary | ICD-10-CM

## 2012-01-27 HISTORY — DX: Type 2 diabetes mellitus without complications: E11.9

## 2012-03-18 ENCOUNTER — Other Ambulatory Visit: Payer: Self-pay | Admitting: Hematology & Oncology

## 2012-03-18 DIAGNOSIS — Z9889 Other specified postprocedural states: Secondary | ICD-10-CM

## 2012-03-18 DIAGNOSIS — Z853 Personal history of malignant neoplasm of breast: Secondary | ICD-10-CM

## 2012-04-22 ENCOUNTER — Encounter (INDEPENDENT_AMBULATORY_CARE_PROVIDER_SITE_OTHER): Payer: Self-pay | Admitting: General Surgery

## 2012-04-30 ENCOUNTER — Ambulatory Visit
Admission: RE | Admit: 2012-04-30 | Discharge: 2012-04-30 | Disposition: A | Discharge status: Home / Self Care | Payer: 59 | Source: Ambulatory Visit | Attending: Hematology & Oncology | Admitting: Hematology & Oncology

## 2012-04-30 DIAGNOSIS — Z853 Personal history of malignant neoplasm of breast: Secondary | ICD-10-CM

## 2012-04-30 DIAGNOSIS — Z9889 Other specified postprocedural states: Secondary | ICD-10-CM

## 2012-06-29 ENCOUNTER — Ambulatory Visit (HOSPITAL_BASED_OUTPATIENT_CLINIC_OR_DEPARTMENT_OTHER): Payer: 59 | Admitting: Hematology & Oncology

## 2012-06-29 ENCOUNTER — Other Ambulatory Visit (HOSPITAL_BASED_OUTPATIENT_CLINIC_OR_DEPARTMENT_OTHER): Payer: 59 | Admitting: Lab

## 2012-06-29 ENCOUNTER — Ambulatory Visit (HOSPITAL_BASED_OUTPATIENT_CLINIC_OR_DEPARTMENT_OTHER): Payer: Medicare Other

## 2012-06-29 VITALS — BP 116/58 | HR 69 | Temp 97.2°F | Resp 18 | Ht 69.0 in | Wt 225.0 lb

## 2012-06-29 DIAGNOSIS — M2550 Pain in unspecified joint: Secondary | ICD-10-CM

## 2012-06-29 DIAGNOSIS — C50919 Malignant neoplasm of unspecified site of unspecified female breast: Secondary | ICD-10-CM

## 2012-06-29 DIAGNOSIS — Z170 Estrogen receptor positive status [ER+]: Secondary | ICD-10-CM

## 2012-06-29 DIAGNOSIS — C50419 Malignant neoplasm of upper-outer quadrant of unspecified female breast: Secondary | ICD-10-CM

## 2012-06-29 DIAGNOSIS — E119 Type 2 diabetes mellitus without complications: Secondary | ICD-10-CM

## 2012-06-29 LAB — CBC WITH DIFFERENTIAL (CANCER CENTER ONLY)
BASO#: 0 10*3/uL (ref 0.0–0.2)
EOS%: 6.2 % (ref 0.0–7.0)
HCT: 38.1 % (ref 34.8–46.6)
HGB: 13.2 g/dL (ref 11.6–15.9)
LYMPH#: 1.4 10*3/uL (ref 0.9–3.3)
MCHC: 34.6 g/dL (ref 32.0–36.0)
MONO#: 0.6 10*3/uL (ref 0.1–0.9)
NEUT#: 4.3 10*3/uL (ref 1.5–6.5)
NEUT%: 63.5 % (ref 39.6–80.0)
RBC: 4.19 10*6/uL (ref 3.70–5.32)
WBC: 6.8 10*3/uL (ref 3.9–10.0)

## 2012-06-29 LAB — CMP (CANCER CENTER ONLY)
AST: 25 U/L (ref 11–38)
Albumin: 3.5 g/dL (ref 3.3–5.5)
Alkaline Phosphatase: 52 U/L (ref 26–84)
BUN, Bld: 15 mg/dL (ref 7–22)
Calcium: 9.8 mg/dL (ref 8.0–10.3)
Chloride: 102 mEq/L (ref 98–108)
Glucose, Bld: 105 mg/dL (ref 73–118)
Potassium: 4.1 mEq/L (ref 3.3–4.7)
Sodium: 139 mEq/L (ref 128–145)
Total Protein: 6.8 g/dL (ref 6.4–8.1)

## 2012-06-29 MED ORDER — ZOLEDRONIC ACID 4 MG/100ML IV SOLN
4.0000 mg | Freq: Once | INTRAVENOUS | Status: AC
  Administered 2012-06-29: 4 mg via INTRAVENOUS
  Filled 2012-06-29: qty 100

## 2012-06-29 MED ORDER — SODIUM CHLORIDE 0.9 % IV SOLN
Freq: Once | INTRAVENOUS | Status: AC
  Administered 2012-06-29: 12:00:00 via INTRAVENOUS

## 2012-06-29 NOTE — Progress Notes (Signed)
This office note has been dictated.

## 2012-06-29 NOTE — Patient Instructions (Signed)
Zoledronic Acid injection (Hypercalcemia, Oncology) What is this medicine? ZOLEDRONIC ACID (ZOE le dron ik AS id) lowers the amount of calcium loss from bone. It is used to treat too much calcium in your blood from cancer. It is also used to prevent complications of cancer that has spread to the bone. This medicine may be used for other purposes; ask your health care provider or pharmacist if you have questions. What should I tell my health care provider before I take this medicine? They need to know if you have any of these conditions: -aspirin-sensitive asthma -dental disease -kidney disease -an unusual or allergic reaction to zoledronic acid, other medicines, foods, dyes, or preservatives -pregnant or trying to get pregnant -breast-feeding How should I use this medicine? This medicine is for infusion into a vein. It is given by a health care professional in a hospital or clinic setting. Talk to your pediatrician regarding the use of this medicine in children. Special care may be needed. Overdosage: If you think you have taken too much of this medicine contact a poison control center or emergency room at once. NOTE: This medicine is only for you. Do not share this medicine with others. What if I miss a dose? It is important not to miss your dose. Call your doctor or health care professional if you are unable to keep an appointment. What may interact with this medicine? -certain antibiotics given by injection -NSAIDs, medicines for pain and inflammation, like ibuprofen or naproxen -some diuretics like bumetanide, furosemide -teriparatide -thalidomide This list may not describe all possible interactions. Give your health care provider a list of all the medicines, herbs, non-prescription drugs, or dietary supplements you use. Also tell them if you smoke, drink alcohol, or use illegal drugs. Some items may interact with your medicine. What should I watch for while using this medicine? Visit  your doctor or health care professional for regular checkups. It may be some time before you see the benefit from this medicine. Do not stop taking your medicine unless your doctor tells you to. Your doctor may order blood tests or other tests to see how you are doing. Women should inform their doctor if they wish to become pregnant or think they might be pregnant. There is a potential for serious side effects to an unborn child. Talk to your health care professional or pharmacist for more information. You should make sure that you get enough calcium and vitamin D while you are taking this medicine. Discuss the foods you eat and the vitamins you take with your health care professional. Some people who take this medicine have severe bone, joint, and/or muscle pain. This medicine may also increase your risk for a broken thigh bone. Tell your doctor right away if you have pain in your upper leg or groin. Tell your doctor if you have any pain that does not go away or that gets worse. What side effects may I notice from receiving this medicine? Side effects that you should report to your doctor or health care professional as soon as possible: -allergic reactions like skin rash, itching or hives, swelling of the face, lips, or tongue -anxiety, confusion, or depression -breathing problems -changes in vision -feeling faint or lightheaded, falls -jaw burning, cramping, pain -muscle cramps, stiffness, or weakness -trouble passing urine or change in the amount of urine Side effects that usually do not require medical attention (report to your doctor or health care professional if they continue or are bothersome): -bone, joint, or muscle pain -  fever -hair loss -irritation at site where injected -loss of appetite -nausea, vomiting -stomach upset -tired This list may not describe all possible side effects. Call your doctor for medical advice about side effects. You may report side effects to FDA at  1-800-FDA-1088. Where should I keep my medicine? This drug is given in a hospital or clinic and will not be stored at home. NOTE: This sheet is a summary. It may not cover all possible information. If you have questions about this medicine, talk to your doctor, pharmacist, or health care provider.  2012, [employer]vier/Gold Standard. (04/26/2011 9:06:58 AM) 

## 2012-06-30 NOTE — Progress Notes (Signed)
CC:   Angelia Mould. Derrell Lolling, M.D. Hal T. Stoneking, M.D.  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  CURRENT THERAPY: 1. Femara 2.5 mg p.o. daily (patient to stop for 1 month secondary to     arthralgias). 2. Zometa 4 mg IV every year.  INTERIM HISTORY:  Ms. Lienemann comes in for followup.  She is having leg pains.  It is hard to say if this is from Femara.  She has been on Femara for 3 years.  She also is on, I think, Crestor.  I told her that we could probably just have her stop the Femara for 1 month.  If the leg pains get better, then we could certainly switch her over to Aromasin.  She is on vitamin D already.  She takes 2000 units a day.  She does have diabetes.  It is possible that some of this pain may be from diabetes.  Her last mammogram was in June.  The mammogram was unremarkable. Followup mammogram was recommended in 1 year.  She has had a good appetite.  There is no change in bowel or bladder habits.  PHYSICAL EXAMINATION:  General:  This is a well-developed, well- nourished white female in no obvious distress.  Vital Signs:  Show a temperature of 97.2, pulse 69, respiratory rate 18, blood pressure 116/58, weight is 225.  Head and Neck Exam:  Shows a normocephalic, atraumatic skull.  There are no ocular or oral lesions.  There are no palpable cervical or supraclavicular lymph nodes.  Lungs:  Clear to percussion and auscultation bilaterally.  Cardiac Exam:  Regular rate and rhythm with a normal S1 and S2.  There are no murmurs, rubs, or bruits.  Breasts:  Shows left breast with no masses, edema, or erythema. There is no left axillary adenopathy.  Right breast shows a well-healed lumpectomy at the 10 o'clock position.  There may be some slight contraction at the lumpectomy site.  No distinct mass is noted in the right breast.  There is no right axillary adenopathy.  Abdominal Exam: Soft with good bowel sounds.  There is no palpable abdominal  mass. There is no palpable hepatosplenomegaly.  Back Exam:  No tenderness over the spine, ribs, or hips.  Extremities:  Show no clubbing, cyanosis, or edema.  There is no swelling in her knees.  There is some tenderness over her thighs to palpation.  She has good strength in the upper and lower extremities.  LABORATORY STUDIES:  White cell count is 6.8, hemoglobin 13.2, hematocrit 38.1, platelet count 235.  Electrolytes are all within normal limits.  Calcium 9.8 with an albumin of 3.5.  IMPRESSION:  Ms. Vanloan is a 71 year old white female with stage I ductal carcinoma of the right breast.  She underwent lumpectomy back in June 2010.  She got radiation therapy.  Her tumor was ER positive.  I did not see that she needed systemic chemotherapy.  Again, I do not see a problem with her being off Femara for 1 month.  We will see if this makes a difference in her arthralgias.  If not, then she will get back on Femara.  Again, it may be the Crestor.  She will be moving down to [place] permanently.  She has retired.  She worked for the [employer].  She probably will be moving in 1 year.  We will see her back in 1 month so that we can see how she is doing with  this arthralgia and myalgia issue.    ______________________________ Josph Macho, M.D. PRE/MEDQ  D:  06/29/2012  T:  06/30/2012  Job:  [id number]

## 2012-07-07 ENCOUNTER — Encounter (INDEPENDENT_AMBULATORY_CARE_PROVIDER_SITE_OTHER): Payer: Self-pay | Admitting: General Surgery

## 2012-07-07 ENCOUNTER — Ambulatory Visit (INDEPENDENT_AMBULATORY_CARE_PROVIDER_SITE_OTHER): Payer: Medicare Other | Admitting: General Surgery

## 2012-07-07 VITALS — BP 112/60 | HR 72 | Temp 97.6°F | Resp 16 | Ht 71.0 in | Wt 225.2 lb

## 2012-07-07 DIAGNOSIS — C50919 Malignant neoplasm of unspecified site of unspecified female breast: Secondary | ICD-10-CM

## 2012-07-07 NOTE — Progress Notes (Signed)
Patient ID: Vickie Burns, female   DOB: 1941/04/09, 71 y.o.   MRN: [id number]  Chief Complaint  Patient presents with  . Breast Cancer Long Term Follow Up    yrly br ck    HPI Vickie Burns is a 71 y.o. female.  She returns for long-term followup regarding her right breast cancer.  In June of 2010 this patient underwent right partial mastectomy and sentinel lymph node biopsy for invasive breast cancer. Pathologic stage T1c., N0, ER-positive, HER-2 negative. She has done well.  She is followed by Arlan Organ. She was recently taken off of Femara to see if that was causing joint pain. Mammograms performed on 04/30/2012 are normal, no focal abnormality, only postop changes noted.  The patient notes no change in her breast and no new concerns.  She has retired from the [employer] since I last saw her. HPI  Past Medical History  Diagnosis Date  . Family history of breast cancer     aunt  . Breast CA 12/20/2011  . Diabetes mellitus     Past Surgical History  Procedure Date  . Breast lumpectomy 2010    rt breast    Family History  Problem Relation Age of Onset  . Heart disease Mother     Confestive Heart Failure  . Diabetes Father   . Heart disease Father     Congestive Heart Failure    Social History History  Substance Use Topics  . Smoking status: Never Smoker   . Smokeless tobacco: Never Used  . Alcohol Use: No    No Known Allergies  Current Outpatient Prescriptions  Medication Sig Dispense Refill  . aspirin 81 MG tablet Take 81 mg by mouth daily.        . calcium & magnesium carbonates (MYLANTA) 311-232 MG per tablet Take 1 tablet by mouth daily.      . Cholecalciferol (VITAMIN D-3 PO) Take 2,000 Units by mouth. 1 am daily       . Cyanocobalamin (B-12 PO) Take 1,000 mg by mouth. 1 am daily       . ezetimibe (ZETIA) 10 MG tablet Take 10 mg by mouth daily.        Marland Kitchen gabapentin (NEURONTIN) 300 MG capsule Take 300 mg by mouth 3 (three) times  daily. 2 tabs 3 times a day.      . Insulin Aspart Prot & Aspart (NOVOLOG MIX 70/30 FLEXPEN [place]) Inject 35 Units into the skin 2 (two) times daily. Takes in morning and again in afternoon       . letrozole (FEMARA) 2.5 MG tablet Take 2.5 mg by mouth daily.        Marland Kitchen lisinopril (PRINIVIL,ZESTRIL) 2.5 MG tablet Take 2.5 mg by mouth daily. Am daily       . METFORMIN HCL PO Take 1,000 mg by mouth 2 (two) times daily. 2 am , 2 pm daily      . Multiple Vitamins-Minerals (CENTRUM SILVER PO) Take by mouth 2 (two) times daily. 1/2 in am , 1/2 in pm       . NOVOFINE 32G X 6 MM MISC       . Omega-3 Fatty Acids (FISH OIL PO) Take 1,200 mg by mouth. 3 am  3 pm daily = 6      . rosuvastatin (CRESTOR) 40 MG tablet Take 40 mg by mouth daily.          Review of Systems Review of Systems  Constitutional: Negative for  fever, chills and unexpected weight change.  HENT: Negative for hearing loss, congestion, sore throat, trouble swallowing and voice change.   Eyes: Negative for visual disturbance.  Respiratory: Negative for cough and wheezing.   Cardiovascular: Negative for chest pain, palpitations and leg swelling.  Gastrointestinal: Negative for nausea, vomiting, abdominal pain, diarrhea, constipation, blood in stool, abdominal distention and anal bleeding.  Genitourinary: Negative for hematuria, vaginal bleeding and difficulty urinating.  Musculoskeletal: Positive for arthralgias.  Skin: Negative for rash and wound.  Neurological: Negative for seizures, syncope and headaches.  Hematological: Negative for adenopathy. Does not bruise/bleed easily.  Psychiatric/Behavioral: Negative for confusion.    Blood pressure 112/60, pulse 72, temperature 97.6 F (36.4 C), temperature source Temporal, resp. rate 16, height 5\' 11"  (1.803 m), weight 225 lb 3.2 oz (102.15 kg).  Physical Exam Physical Exam  Constitutional: She is oriented to person, place, and time. She appears well-developed and well-nourished. No  distress.  HENT:  Head: Normocephalic and atraumatic.  Nose: Nose normal.  Mouth/Throat: No oropharyngeal exudate.  Neck: Neck supple. No JVD present. No tracheal deviation present. No thyromegaly present.  Cardiovascular: Normal rate, regular rhythm, normal heart sounds and intact distal pulses.   No murmur heard. Pulmonary/Chest: Effort normal and breath sounds normal. No respiratory distress. She has no wheezes. She has no rales. She exhibits no tenderness.       Well healed scar right breast, upper outer quadrant and right axilla. No palpable mass in either breast. No other skin changes. No axillary or cervical adenopathy.  Musculoskeletal: She exhibits no edema and no tenderness.  Lymphadenopathy:    She has no cervical adenopathy.  Neurological: She is alert and oriented to person, place, and time. She exhibits normal muscle tone. Coordination normal.  Skin: Skin is warm. No rash noted. She is not diaphoretic. No erythema. No pallor.  Psychiatric: She has a normal mood and affect. Her behavior is normal. Judgment and thought content normal.    Data Reviewed Recent mammograms. Old office notes.  Assessment    Invasive mammary carcinoma right breast, upper outer quadrant, pathologic stage T1c., N0, ER-positive, HER-2-negative.  No evidence of recurrence 3 years following right partial mastectomy and sentinel node biopsy    Plan    Continue regular follow-up with Dr. Arlan Organ.  Repeat bilateral mammograms one year  Return to see me in one year.       Angelia Mould. Derrell Lolling, M.D., [facility] General and Minimally invasive Surgery Breast and Colorectal Surgery Office:   [phone] Pager:   [phone]  07/07/2012, 2:52 PM

## 2012-07-07 NOTE — Patient Instructions (Signed)
Examination of your breast and lymph node areas is normal. There is no clinical evidence for cancer. Your mammograms that were performed in June are also normal.  Keep her regular appointment with Dr. Myna Hidalgo.  Repeat mammograms next June.  Return to see Dr. Derrell Lolling in one year.

## 2012-08-03 ENCOUNTER — Ambulatory Visit (HOSPITAL_BASED_OUTPATIENT_CLINIC_OR_DEPARTMENT_OTHER): Payer: Medicare Other | Admitting: Hematology & Oncology

## 2012-08-03 VITALS — BP 131/58 | HR 68 | Temp 97.5°F | Resp 18 | Ht 70.0 in | Wt 224.0 lb

## 2012-08-03 DIAGNOSIS — C50419 Malignant neoplasm of upper-outer quadrant of unspecified female breast: Secondary | ICD-10-CM

## 2012-08-03 DIAGNOSIS — C50919 Malignant neoplasm of unspecified site of unspecified female breast: Secondary | ICD-10-CM

## 2012-08-03 DIAGNOSIS — Z170 Estrogen receptor positive status [ER+]: Secondary | ICD-10-CM

## 2012-08-03 NOTE — Progress Notes (Signed)
This office note has been dictated.

## 2012-08-03 NOTE — Progress Notes (Signed)
CC:   Hal T. Stoneking, M.D. Angelia Mould. Derrell Lolling, M.D.  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  CURRENT THERAPY:  Femara 2.5 mg p.o. daily, the patient has held this for 1 month.  INTERIM HISTORY:  Ms. Sons comes in for a quick followup.  The last time I saw her she was having problems with arthralgias and myalgias. She had been on Femara for 3 years.  She said when she switched over to a generic version she began having issues.  We stopped the Femara.  She says that right away the leg pains got better.  She also felt that some of the leg pains were because of her exercising.  She is on Crestor.  I told her the Crestor could certainly also be a problem.  I told her to stop the Crestor for a month and see how she does.  She wants go get back on the Femara.  I told her that it was important that she stay on the Femara for 5 years total.  She has had no other issues.  There is no cough.  There is no problem with bowels or bladder.  She has not yet made it to [place].  This is still in the works.  PHYSICAL EXAMINATION:  General:  This is a well-developed, well- nourished white female in no obvious distress.  Vital signs: Temperature of 97.5, pulse 68, respiratory rate 18, blood pressure 131/58.  Weight is 224.  Head and neck:  Shows a normocephalic, atraumatic skull.  There are no ocular or oral lesions.  There are no palpable cervical or supraclavicular lymph nodes.  Lungs:  Clear bilaterally.  Cardiac:  Regular rate and rhythm with a normal S1, S2. There are no murmurs, rubs or bruits.  Abdomen:  Soft with good bowel sounds.  There is no palpable abdominal mass.  No palpable hepatosplenomegaly.  Extremities:  Show no clubbing, cyanosis or edema. There is still some slight tenderness to palpation over her thighs. Neurologic:  No focal neurological deficits.  IMPRESSION:  Ms. Loa is a 71 year old white female with stage I ductal carcinoma of the  right breast.  She underwent lumpectomy and radiation. She is on Femara.  Again, will get her back on the Femara.  She will be off the Crestor for 1 month.  I do not see how this is going to be a problem for her.  We will get her back in 1 more month.  If she has recurrence of the arthralgias then we can switch her over to Aromasin.    ______________________________ Josph Macho, M.D. PRE/MEDQ  D:  08/03/2012  T:  08/03/2012  Job:  [id number]

## 2012-08-17 DIAGNOSIS — E785 Hyperlipidemia, unspecified: Secondary | ICD-10-CM | POA: Insufficient documentation

## 2012-08-17 HISTORY — DX: Hyperlipidemia, unspecified: E78.5

## 2012-09-01 ENCOUNTER — Telehealth: Payer: Self-pay | Admitting: Hematology & Oncology

## 2012-09-01 NOTE — Telephone Encounter (Signed)
Patient called and cx 09/07/12 appt and resch for 09/22/12.

## 2012-09-07 ENCOUNTER — Ambulatory Visit: Payer: Medicare Other | Admitting: Hematology & Oncology

## 2012-09-21 ENCOUNTER — Telehealth: Payer: Self-pay | Admitting: Hematology & Oncology

## 2012-09-21 NOTE — Telephone Encounter (Signed)
Pt moved 11-12 to 11-18. She is sick

## 2012-09-22 ENCOUNTER — Ambulatory Visit: Payer: Medicare Other | Admitting: Hematology & Oncology

## 2012-09-28 ENCOUNTER — Ambulatory Visit (HOSPITAL_BASED_OUTPATIENT_CLINIC_OR_DEPARTMENT_OTHER): Payer: Medicare Other | Admitting: Hematology & Oncology

## 2012-09-28 VITALS — BP 124/52 | HR 81 | Temp 97.9°F | Resp 18 | Ht 70.0 in | Wt 227.0 lb

## 2012-09-28 DIAGNOSIS — M25569 Pain in unspecified knee: Secondary | ICD-10-CM

## 2012-09-28 DIAGNOSIS — C50419 Malignant neoplasm of upper-outer quadrant of unspecified female breast: Secondary | ICD-10-CM

## 2012-09-28 DIAGNOSIS — C50919 Malignant neoplasm of unspecified site of unspecified female breast: Secondary | ICD-10-CM

## 2012-09-28 DIAGNOSIS — M810 Age-related osteoporosis without current pathological fracture: Secondary | ICD-10-CM

## 2012-09-28 NOTE — Progress Notes (Signed)
This office note has been dictated.

## 2012-09-29 NOTE — Progress Notes (Signed)
CC:   Vickie Burns, M.D. Vickie Burns. Vickie Burns, M.D.  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  CURRENT THERAPY:  Femara 2.5 mg p.o. daily.  INTERIM HISTORY:  [name] come in for a quick followup.  She is doing better.  She is exercising more.  Her arthralgias are improving.  She still has a little bit of knee pain, but she feels this is just from her weight and just not exercising.  She is back on Crestor.  She does not have a lot of myalgias from the Crestor.  She is getting ready for the holidays.  She is going down to [place] for the holidays.  She has had no rashes.  There has been no change in bowel or bladder habits.  There has been no cough.  PHYSICAL EXAMINATION:  General:  This is a well-developed, well- nourished white female in no obvious distress.  Vital signs:  97.9, pulse 81, respiratory rate 18, blood pressure 124/52.  Weight is 227. Head and neck:  No ocular or oral lesions.  There are no palpable cervical or supraclavicular lymph nodes.  Lungs:  Clear bilaterally. Cardiac:  Regular rate and rhythm with a normal S1 and S2.  There are no murmurs, rubs, or bruits.  Abdomen:  Soft with good bowel sounds.  There is no palpable abdominal mass.  There is no palpable hepatosplenomegaly. Extremities:  No clubbing, cyanosis, or edema.  She does not have any swelling in the joints.  She has good range of motion of her joints. Skin:  No rashes, ecchymosis, or petechia.  IMPRESSION:  [name] is a 71 year old female with stage I ductal carcinoma of the right breast.  She was diagnosed back in June 2010. She is on Femara.  We will continue Femara for a total of 5 years.  We will try to get her back in 6 months' time now.  I do not see that we need to do any blood work in between visits.  I am just glad to see that she is feeling better and more functional.    ______________________________ Josph Macho, M.D. PRE/MEDQ  D:   09/28/2012  T:  09/29/2012  Job:  [id number]

## 2012-11-13 ENCOUNTER — Other Ambulatory Visit: Payer: Self-pay | Admitting: *Deleted

## 2012-11-13 DIAGNOSIS — C50919 Malignant neoplasm of unspecified site of unspecified female breast: Secondary | ICD-10-CM

## 2012-11-13 MED ORDER — LETROZOLE 2.5 MG PO TABS: 2.5000 mg | ORAL_TABLET | Freq: Every day | ORAL | Status: DC

## 2012-11-13 NOTE — Telephone Encounter (Signed)
Pt called requesting a 90 day supply of letrozole be sent to [facility]. This is a chronic med for her. Sent via e-rx.

## 2013-02-02 ENCOUNTER — Other Ambulatory Visit: Payer: Self-pay | Admitting: Gastroenterology

## 2013-03-23 ENCOUNTER — Encounter: Payer: Self-pay | Admitting: *Deleted

## 2013-03-23 ENCOUNTER — Encounter: Payer: Self-pay | Admitting: Nurse Practitioner

## 2013-03-23 ENCOUNTER — Ambulatory Visit (INDEPENDENT_AMBULATORY_CARE_PROVIDER_SITE_OTHER): Payer: Medicare Other | Admitting: Nurse Practitioner

## 2013-03-23 VITALS — BP 128/70 | HR 74 | Resp 14 | Ht 69.0 in | Wt 224.4 lb

## 2013-03-23 DIAGNOSIS — C50911 Malignant neoplasm of unspecified site of right female breast: Secondary | ICD-10-CM

## 2013-03-23 DIAGNOSIS — Z01419 Encounter for gynecological examination (general) (routine) without abnormal findings: Secondary | ICD-10-CM

## 2013-03-23 DIAGNOSIS — C50919 Malignant neoplasm of unspecified site of unspecified female breast: Secondary | ICD-10-CM

## 2013-03-23 NOTE — Progress Notes (Signed)
72 y.o. G2P2 Widowed Caucasian Fe here for annual exam.  No new health problems except  for bilateral knee pain.  Has appointment in June. She is doing well since breast cancer and remains of Femara. Sees oncologist. Endocrinologist monitors HGB AIC and hypertension - both are doing well.  Sons live in [place] and she visits with them every weekend and helps with grandchildren.  They wasn't her to move down there and she is considering selling home in [facility] and moving within the next several years.  No LMP recorded. Patient is postmenopausal.          Sexually active: no  The current method of family planning is post menopausal status.    Exercising: no  The patient does not participate in regular exercise at present. Smoker:  no  Health Maintenance: Pap:  04/07/2008 MMG:  04/2012 normal Colonoscopy:  01/2013 polyps recheck in 1 year BMD:   03/2008 normal ? Endocrinologist is giving Reclast TDaP:  Up to date, per pt.  Labs: Cardiologist and endocrinologist, PCP does lab (blood) work. Reclast for osteopenia due again 03/29/13    reports that she has never smoked. She has never used smokeless tobacco. She reports that she does not drink alcohol or use illicit drugs.  Past Medical History  Diagnosis Date  . Family history of breast cancer     aunt  . Breast CA 12/20/2011  . Diabetes mellitus     Past Surgical History  Procedure Laterality Date  . Breast lumpectomy  2010    rt breast    Current Outpatient Prescriptions  Medication Sig Dispense Refill  . aspirin 81 MG tablet Take 81 mg by mouth daily.        . calcium & magnesium carbonates (MYLANTA) 311-232 MG per tablet Take 1 tablet by mouth daily.      . Cholecalciferol (VITAMIN D-3 PO) Take 2,000 Units by mouth. 1 am daily       . Cyanocobalamin (B-12 PO) Take 1,000 mg by mouth. 1 am daily       . ezetimibe (ZETIA) 10 MG tablet Take 10 mg by mouth daily.        Marland Kitchen gabapentin (NEURONTIN) 300 MG capsule Take 300 mg by mouth 3  (three) times daily. 2 tabs 3 times a day.      . Insulin Aspart Prot & Aspart (NOVOLOG MIX 70/30 FLEXPEN [place]) Inject 35 Units into the skin 2 (two) times daily. Takes in morning and again in afternoon       . letrozole (FEMARA) 2.5 MG tablet Take 1 tablet (2.5 mg total) by mouth daily.  90 tablet  3  . lisinopril (PRINIVIL,ZESTRIL) 2.5 MG tablet Take 2.5 mg by mouth daily. Am daily       . METFORMIN HCL PO Take 1,000 mg by mouth 2 (two) times daily. 2 am , 2 pm daily      . Multiple Vitamins-Minerals (CENTRUM SILVER PO) Take by mouth 2 (two) times daily. 1/2 in am , 1/2 in pm       . Omega-3 Fatty Acids (FISH OIL PO) Take 1,200 mg by mouth 2 (two) times daily. 3 am  3 pm daily = 6      . rosuvastatin (CRESTOR) 40 MG tablet Take 40 mg by mouth daily.         No current facility-administered medications for this visit.    Family History  Problem Relation Age of Onset  . Heart disease Mother  Confestive Heart Failure  . Diabetes Father   . Heart disease Father     Congestive Heart Failure    ROS:  Pertinent items are noted in HPI.  Otherwise, a comprehensive ROS was negative.  Exam:   BP 128/70  Pulse 74  Resp 14  Ht 5\' 9"  (1.753 m)  Wt 224 lb 6.4 oz (101.787 kg)  BMI 33.12 kg/m2 Height: 5\' 9"  (175.3 cm)  Ht Readings from Last 3 Encounters:  03/23/13 5\' 9"  (1.753 m)  09/28/12 5\' 10"  (1.778 m)  08/03/12 5\' 10"  (1.778 m)    General appearance: alert, cooperative and appears stated age. While here had a drop in glucose and she was better after a snack. Head: Normocephalic, without obvious abnormality, atraumatic Neck: no adenopathy, supple, symmetrical, trachea midline and thyroid normal to inspection and palpation Lungs: clear to auscultation bilaterally Breasts: left normal appearance, no masses or tenderness, positive findings: surgical changes on the right without mass Heart: regular rate and rhythm Abdomen: soft, non-tender; no masses,  no organomegaly Extremities:  extremities normal, atraumatic, no cyanosis or edema Skin: Skin color, texture, turgor normal. No rashes or lesions Lymph nodes: Cervical, supraclavicular, and axillary nodes normal. No abnormal inguinal nodes palpated Neurologic: Grossly normal   Pelvic: External genitalia:  no lesions              Urethra:  normal appearing urethra with no masses, tenderness or lesions              Bartholin's and Skene's: normal                 Vagina: normal appearing vagina with normal color and discharge, no lesions              Cervix: anteverted              Pap taken: yes per patient request Bimanual Exam:  Uterus:  normal size, contour, position, consistency, mobility, non-tender              Adnexa: no mass, fullness, tenderness               Rectovaginal: Confirms               Anus:  normal sphincter tone, no lesions  A:  Well Woman with normal exam  Postmenopausal  S/P Right Breast Cancer 04/2009 with lumpectomy and radiation treatment, On Femara  Not Sexually active  P:   Pap smear as per guidelines   Mammogram due 6/14  counseled on adequate intake of calcium and vitamin D, diet and exercise  return annually or prn  Patient felt better and skin warm and dry at time of discharge. An After Visit Summary was printed and given to the patient.

## 2013-03-23 NOTE — Patient Instructions (Signed)

## 2013-03-24 LAB — IPS PAP SMEAR ONLY

## 2013-03-24 NOTE — Progress Notes (Signed)
Encounter reviewed by Dr. Brook Silva.  

## 2013-03-29 ENCOUNTER — Other Ambulatory Visit (HOSPITAL_BASED_OUTPATIENT_CLINIC_OR_DEPARTMENT_OTHER): Payer: Medicare Other | Admitting: Lab

## 2013-03-29 ENCOUNTER — Other Ambulatory Visit: Payer: Self-pay | Admitting: Medical

## 2013-03-29 ENCOUNTER — Ambulatory Visit (HOSPITAL_BASED_OUTPATIENT_CLINIC_OR_DEPARTMENT_OTHER): Payer: Medicare Other | Admitting: Medical

## 2013-03-29 VITALS — BP 134/62 | HR 75 | Temp 98.1°F | Resp 16 | Ht 69.0 in | Wt 226.0 lb

## 2013-03-29 DIAGNOSIS — C50419 Malignant neoplasm of upper-outer quadrant of unspecified female breast: Secondary | ICD-10-CM

## 2013-03-29 DIAGNOSIS — C50919 Malignant neoplasm of unspecified site of unspecified female breast: Secondary | ICD-10-CM

## 2013-03-29 DIAGNOSIS — M810 Age-related osteoporosis without current pathological fracture: Secondary | ICD-10-CM

## 2013-03-29 LAB — CBC WITH DIFFERENTIAL (CANCER CENTER ONLY)
BASO#: 0 10*3/uL (ref 0.0–0.2)
BASO%: 0.4 % (ref 0.0–2.0)
EOS%: 4 % (ref 0.0–7.0)
HCT: 38.4 % (ref 34.8–46.6)
HGB: 12.8 g/dL (ref 11.6–15.9)
LYMPH#: 1.2 10*3/uL (ref 0.9–3.3)
MCHC: 33.3 g/dL (ref 32.0–36.0)
MONO#: 0.7 10*3/uL (ref 0.1–0.9)
NEUT#: 5 10*3/uL (ref 1.5–6.5)
NEUT%: 69 % (ref 39.6–80.0)
WBC: 7.3 10*3/uL (ref 3.9–10.0)

## 2013-03-29 NOTE — Progress Notes (Signed)
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  CURRENT THERAPY:  Femara 2.5 mg p.o. daily.  INTERIM HISTORY: Vickie Burns presents today for an office followup visit.  Overall she reports that she's doing remarkably well.  She still continues to have some leg and knee pain, but reports is a chronic issue.  She does have a followup visit with her primary care physician in June.  She's not reporting any new problems or complaints.  She seems to be tolerating the Femara quite well without any toxicity.  She is due for her mammogram in June.  She's not reporting any new bony pain.  She reports good appetite.  She denies any nocturia, vomiting, diarrhea, constipation, fevers, chills, night sweats, chest pain, shortness of breath, or cough.  She denies any abdominal pain, any lower leg swelling, obvious or abnormal bleeding.  She denies any visual changes, rashes, or headaches.  Review of Systems: Constitutional:Negative for malaise/fatigue, fever, chills, weight loss, diaphoresis, activity change, appetite change, and unexpected weight change.  HEENT: Negative for double vision, blurred vision, visual loss, ear pain, tinnitus, congestion, rhinorrhea, epistaxis sore throat or sinus disease, oral pain/lesion, tongue soreness Respiratory: Negative for cough, chest tightness, shortness of breath, wheezing and stridor.  Cardiovascular: Negative for chest pain, palpitations, leg swelling, orthopnea, PND, DOE or claudication Gastrointestinal: Negative for nausea, vomiting, abdominal pain, diarrhea, constipation, blood in stool, melena, hematochezia, abdominal distention, anal bleeding, rectal pain, anorexia and hematemesis.  Genitourinary: Negative for dysuria, frequency, hematuria,  Musculoskeletal: Negative for myalgias, back pain, joint swelling, arthralgias and gait problem.  Skin: Negative for rash, color change, pallor and wound.  Neurological:. Negative for  dizziness/light-headedness, tremors, seizures, syncope, facial asymmetry, speech difficulty, weakness, numbness, headaches and paresthesias.  Hematological: Negative for adenopathy. Does not bruise/bleed easily.  Psychiatric/Behavioral:  Negative for depression, no loss of interest in normal activity or change in sleep pattern.   Physical Exam: Vitals: Temperature 98.1 degrees pulse 75 respirations 16 blood pressure 134/62 weight 226 pounds HEENT reveals a normocephalic, atraumatic skull, no scleral icterus, no oral lesions  Neck is supple without any cervical or supraclavicular adenopathy.  Lungs are clear to auscultation bilaterally. There are no wheezes, rales or rhonci Cardiac is regular rate and rhythm with a normal S1 and S2. There are no murmurs, rubs, or bruits.  Abdomen is soft with good bowel sounds, there is no palpable mass. There is no palpable hepatosplenomegaly. There is no palpable fluid wave.  Musculoskeletal no tenderness of the spine, ribs, or hips.  Extremities there are no clubbing, cyanosis, or edema.  Skin no petechia, purpura or ecchymosis Neurologic is nonfocal. Breasts: Right breast has a well healed lumpectomy at the 10:00 position.  There is some slight contraction at the lumpectomy site.  Next the mass is noted in the right breast.  There is no right axillary adenopathy.  Left breast is with no masses, edema, or erythema, there is no left axillary adenopathy.  Laboratory Data: White count 7.3 hemoglobin 12.8 hematocrit 38.4 platelets 232,000  Current Outpatient Prescriptions on File Prior to Visit  Medication Sig Dispense Refill  . aspirin 81 MG tablet Take 81 mg by mouth daily.        . calcium & magnesium carbonates (MYLANTA) 311-232 MG per tablet Take 1 tablet by mouth daily.      . Cholecalciferol (VITAMIN D-3 PO) Take 2,000 Units by mouth. 1 am daily       . Cyanocobalamin (B-12 PO) Take  1,000 mg by mouth. 1 am daily       . ezetimibe (ZETIA) 10 MG tablet  Take 10 mg by mouth daily.        Marland Kitchen gabapentin (NEURONTIN) 300 MG capsule Take 300 mg by mouth 3 (three) times daily. 2 tabs 3 times a day.      . Insulin Aspart Prot & Aspart (NOVOLOG MIX 70/30 FLEXPEN [place]) Inject 35 Units into the skin 2 (two) times daily. Takes in morning and again in afternoon       . letrozole (FEMARA) 2.5 MG tablet Take 1 tablet (2.5 mg total) by mouth daily.  90 tablet  3  . lisinopril (PRINIVIL,ZESTRIL) 2.5 MG tablet Take 2.5 mg by mouth daily. Am daily       . METFORMIN HCL PO Take 1,000 mg by mouth 2 (two) times daily. 2 am , 2 pm daily      . Multiple Vitamins-Minerals (CENTRUM SILVER PO) Take by mouth 2 (two) times daily. 1/2 in am , 1/2 in pm       . Omega-3 Fatty Acids (FISH OIL PO) Take 1,200 mg by mouth 2 (two) times daily. 3 am  3 pm daily = 6      . rosuvastatin (CRESTOR) 40 MG tablet Take 40 mg by mouth daily.         No current facility-administered medications on file prior to visit.   Assessment/Plan: This is a pleasant 72 year old white female with the following issues:  #1.  Stage I ductal carcinoma of the right breast.  She was diagnosed back in June 2010.  She remains on Femara without any problems.  She will continue Femara for total 5 years.  #2.  Followup.  We will follow back up with Vickie Burns in 6 months but before then should there be questions or concerns.

## 2013-03-30 ENCOUNTER — Other Ambulatory Visit: Payer: Self-pay | Admitting: Hematology & Oncology

## 2013-03-30 DIAGNOSIS — Z853 Personal history of malignant neoplasm of breast: Secondary | ICD-10-CM

## 2013-03-30 LAB — COMPREHENSIVE METABOLIC PANEL
ALT: 32 U/L (ref 0–35)
AST: 34 U/L (ref 0–37)
Albumin: 4.2 g/dL (ref 3.5–5.2)
BUN: 16 mg/dL (ref 6–23)
CO2: 30 mEq/L (ref 19–32)
Calcium: 10 mg/dL (ref 8.4–10.5)
Chloride: 99 mEq/L (ref 96–112)
Potassium: 4.8 mEq/L (ref 3.5–5.3)

## 2013-03-30 LAB — VITAMIN D 25 HYDROXY (VIT D DEFICIENCY, FRACTURES): Vit D, 25-Hydroxy: 54 ng/mL (ref 30–89)

## 2013-05-12 ENCOUNTER — Ambulatory Visit
Admission: RE | Admit: 2013-05-12 | Discharge: 2013-05-12 | Disposition: A | Discharge status: Home / Self Care | Payer: Medicare Other | Source: Ambulatory Visit | Attending: Hematology & Oncology | Admitting: Hematology & Oncology

## 2013-05-12 DIAGNOSIS — Z853 Personal history of malignant neoplasm of breast: Secondary | ICD-10-CM

## 2013-06-30 ENCOUNTER — Ambulatory Visit (HOSPITAL_BASED_OUTPATIENT_CLINIC_OR_DEPARTMENT_OTHER): Payer: Medicare Other

## 2013-06-30 VITALS — BP 133/76 | HR 62

## 2013-06-30 DIAGNOSIS — C50419 Malignant neoplasm of upper-outer quadrant of unspecified female breast: Secondary | ICD-10-CM

## 2013-06-30 DIAGNOSIS — C50911 Malignant neoplasm of unspecified site of right female breast: Secondary | ICD-10-CM

## 2013-06-30 MED ORDER — ZOLEDRONIC ACID 4 MG/100ML IV SOLN
4.0000 mg | Freq: Once | INTRAVENOUS | Status: AC
  Administered 2013-06-30: 4 mg via INTRAVENOUS
  Filled 2013-06-30: qty 100

## 2013-06-30 NOTE — Patient Instructions (Addendum)
Zoledronic Acid injection (Hypercalcemia, Oncology) What is this medicine? ZOLEDRONIC ACID (ZOE le dron ik AS id) lowers the amount of calcium loss from bone. It is used to treat too much calcium in your blood from cancer. It is also used to prevent complications of cancer that has spread to the bone. This medicine may be used for other purposes; ask your health care provider or pharmacist if you have questions. What should I tell my health care provider before I take this medicine? They need to know if you have any of these conditions: -aspirin-sensitive asthma -dental disease -kidney disease -an unusual or allergic reaction to zoledronic acid, other medicines, foods, dyes, or preservatives -pregnant or trying to get pregnant -breast-feeding How should I use this medicine? This medicine is for infusion into a vein. It is given by a health care professional in a hospital or clinic setting. Talk to your pediatrician regarding the use of this medicine in children. Special care may be needed. Overdosage: If you think you have taken too much of this medicine contact a poison control center or emergency room at once. NOTE: This medicine is only for you. Do not share this medicine with others. What if I miss a dose? It is important not to miss your dose. Call your doctor or health care professional if you are unable to keep an appointment. What may interact with this medicine? -certain antibiotics given by injection -NSAIDs, medicines for pain and inflammation, like ibuprofen or naproxen -some diuretics like bumetanide, furosemide -teriparatide -thalidomide This list may not describe all possible interactions. Give your health care provider a list of all the medicines, herbs, non-prescription drugs, or dietary supplements you use. Also tell them if you smoke, drink alcohol, or use illegal drugs. Some items may interact with your medicine. What should I watch for while using this medicine? Visit  your doctor or health care professional for regular checkups. It may be some time before you see the benefit from this medicine. Do not stop taking your medicine unless your doctor tells you to. Your doctor may order blood tests or other tests to see how you are doing. Women should inform their doctor if they wish to become pregnant or think they might be pregnant. There is a potential for serious side effects to an unborn child. Talk to your health care professional or pharmacist for more information. You should make sure that you get enough calcium and vitamin D while you are taking this medicine. Discuss the foods you eat and the vitamins you take with your health care professional. Some people who take this medicine have severe bone, joint, and/or muscle pain. This medicine may also increase your risk for a broken thigh bone. Tell your doctor right away if you have pain in your upper leg or groin. Tell your doctor if you have any pain that does not go away or that gets worse. What side effects may I notice from receiving this medicine? Side effects that you should report to your doctor or health care professional as soon as possible: -allergic reactions like skin rash, itching or hives, swelling of the face, lips, or tongue -anxiety, confusion, or depression -breathing problems -changes in vision -feeling faint or lightheaded, falls -jaw burning, cramping, pain -muscle cramps, stiffness, or weakness -trouble passing urine or change in the amount of urine Side effects that usually do not require medical attention (report to your doctor or health care professional if they continue or are bothersome): -bone, joint, or muscle pain -  fever -hair loss -irritation at site where injected -loss of appetite -nausea, vomiting -stomach upset -tired This list may not describe all possible side effects. Call your doctor for medical advice about side effects. You may report side effects to FDA at  1-800-FDA-1088. Where should I keep my medicine? This drug is given in a hospital or clinic and will not be stored at home. NOTE: This sheet is a summary. It may not cover all possible information. If you have questions about this medicine, talk to your doctor, pharmacist, or health care provider.  2012, Elsevier/Gold Standard. (04/26/2011 9:06:58 AM) 

## 2013-07-08 ENCOUNTER — Encounter (INDEPENDENT_AMBULATORY_CARE_PROVIDER_SITE_OTHER): Payer: Self-pay | Admitting: General Surgery

## 2013-07-08 ENCOUNTER — Ambulatory Visit (INDEPENDENT_AMBULATORY_CARE_PROVIDER_SITE_OTHER): Payer: Medicare Other | Admitting: General Surgery

## 2013-07-08 VITALS — BP 118/68 | HR 68 | Temp 98.2°F | Resp 14 | Ht 70.0 in | Wt 220.4 lb

## 2013-07-08 DIAGNOSIS — C50911 Malignant neoplasm of unspecified site of right female breast: Secondary | ICD-10-CM

## 2013-07-08 DIAGNOSIS — C50919 Malignant neoplasm of unspecified site of unspecified female breast: Secondary | ICD-10-CM

## 2013-07-08 NOTE — Progress Notes (Signed)
Patient ID: Vickie Burns, female   DOB: 1941-03-02, 72 y.o.   MRN: [id number]  History: . Vickie Burns is a 72 y.o. female. She returns for long-term followup regarding her right breast cancer.  In June of 2010 this patient underwent right partial mastectomy and sentinel lymph node biopsy for invasive breast cancer. Pathologic stage T1c., N0, ER-positive, HER-2 negative. She has done well.  She is followed by Arlan Organ. She is on generic Femara  And is tolerating that. Mammograms performed on 05/12/2013 are normal, no focal abnormality, only postop changes and increased density noted.  The patient notes no change in her breast and no new concerns.  She has retired from the [employer].  Exam: Patient looks well. No distress. Friendly. Neck: No adenopathy or mass Lungs: Clear to auscultation bilaterally Heart: Regular rate and rhythm. No murmur. No ectopy Breast multiple scars right breast, transverse 12:00 position, transverse lateral, right axilla. No palpable mass in either breast. No other skin changes. No adenopathy.   Assessment:  Invasive mammary carcinoma right breast, upper outer quadrant, pathologic stage T1c, N0, ER-positive, HER-2-negative No evidence of recurrence 4 years following right partial mastectomy and sentinel node biopsy  Plan:   continue Femara and regular followup with Dr. Myna Hidalgo Repeat bilateral mammograms in 1 year Return to see me in one year.    Angelia Mould. Derrell Lolling, M.D., [facility] General and Minimally invasive Surgery Breast and Colorectal Surgery Office:   [phone] Pager:   [phone]

## 2013-07-08 NOTE — Patient Instructions (Signed)
Your breast exam today is normal, and your mammograms show no focal abnormality. There is  increased density.  There is no evidence of cancer.  Continue to take the Femara and keep all of your routine followup appointments with Dr. Myna Hidalgo  Be sure to get mammograms next July  Return to see Dr. Derrell Lolling in one year for a checkup.

## 2013-09-27 ENCOUNTER — Ambulatory Visit (HOSPITAL_BASED_OUTPATIENT_CLINIC_OR_DEPARTMENT_OTHER): Payer: Medicare Other | Admitting: Hematology & Oncology

## 2013-09-27 ENCOUNTER — Other Ambulatory Visit (HOSPITAL_BASED_OUTPATIENT_CLINIC_OR_DEPARTMENT_OTHER): Payer: Medicare Other | Admitting: Lab

## 2013-09-27 VITALS — BP 118/60 | HR 81 | Temp 97.4°F | Resp 14 | Ht 70.0 in | Wt 219.0 lb

## 2013-09-27 DIAGNOSIS — C50911 Malignant neoplasm of unspecified site of right female breast: Secondary | ICD-10-CM

## 2013-09-27 DIAGNOSIS — C50919 Malignant neoplasm of unspecified site of unspecified female breast: Secondary | ICD-10-CM

## 2013-09-27 LAB — COMPREHENSIVE METABOLIC PANEL
Albumin: 4.1 g/dL (ref 3.5–5.2)
Alkaline Phosphatase: 43 U/L (ref 39–117)
BUN: 13 mg/dL (ref 6–23)
CO2: 25 mEq/L (ref 19–32)
Calcium: 9.9 mg/dL (ref 8.4–10.5)
Chloride: 103 mEq/L (ref 96–112)
Glucose, Bld: 88 mg/dL (ref 70–99)
Potassium: 4.4 mEq/L (ref 3.5–5.3)
Sodium: 134 mEq/L — ABNORMAL LOW (ref 135–145)
Total Protein: 6.2 g/dL (ref 6.0–8.3)

## 2013-09-27 LAB — CBC WITH DIFFERENTIAL (CANCER CENTER ONLY)
BASO#: 0 10*3/uL (ref 0.0–0.2)
Eosinophils Absolute: 0.4 10*3/uL (ref 0.0–0.5)
HGB: 12.1 g/dL (ref 11.6–15.9)
LYMPH#: 1.4 10*3/uL (ref 0.9–3.3)
MCH: 29.8 pg (ref 26.0–34.0)
MONO#: 0.6 10*3/uL (ref 0.1–0.9)
MONO%: 10.6 % (ref 0.0–13.0)
NEUT#: 3.3 10*3/uL (ref 1.5–6.5)
Platelets: 230 10*3/uL (ref 145–400)
RBC: 4.06 10*6/uL (ref 3.70–5.32)
WBC: 5.7 10*3/uL (ref 3.9–10.0)

## 2013-09-27 NOTE — Progress Notes (Signed)
This office note has been dictated.

## 2013-10-18 ENCOUNTER — Other Ambulatory Visit: Payer: Self-pay | Admitting: Hematology & Oncology

## 2013-10-19 NOTE — Progress Notes (Signed)
CC:   Hal T. Stoneking, M.D. 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  CURRENT THERAPY:  Femara 2.5 mg p.o. q. day, to finish up in July of 2015.  INTERIM HISTORY:  [name] comes in for followup.  She is doing quite well.  We last saw her back in May.  She had no problems over the summertime.  She is getting ready for the holidays.  She is looking forward to this.  She is still not able to do [place].  She is not sure when she will do this.  She says that her knees are feeling better.  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  She is worried about circulation.  I told her that from my point of view, she is doing well with the circulation.  I told the main issue with her is going to be her diabetes.  She needs to get this under good control to minimize peripheral arterial issues.  She has had no problems with nausea or vomiting.  She has had no fever, sweats, or chills.  She has had no headache.  She has had no rashes. She has had no weight loss or weight gain.  PHYSICAL EXAMINATION:  General:  This is a well-developed, well- nourished, white female, in no obvious distress.  Vital Signs: Temperature 97.4, pulse 81, respiratory rate 14, blood pressure 118/60. Weight is 219 pounds.  Head and Neck:  Normocephalic and atraumatic skull.  There are no ocular or oral lesions.  There are no palpable cervical or supraclavicular lymph nodes.  Lungs:  Clear bilaterally. Cardiac:  Regular rate and rhythm with a normal S1 and S2.  There are no murmurs, rubs, or bruits.  Breasts Exam:  Left breast with no masses, edema, or erythema.  There is no left axillary adenopathy.  Right breast shows a well-healed lumpectomy at the 10 o'clock position.  No masses noted in the right breast.  There is no right axillary adenopathy. Abdomen:  Soft.  She has good bowel sounds.  There is no fluid wave. There is no  palpable hepatosplenomegaly.  Back:  No kyphosis or osteoporotic changes.  She has no tenderness over the spine, ribs, or hips.  Extremities:  No clubbing, cyanosis, or edema.  She has good pulses in distal extremities.  LABORATORY STUDIES:  White cell count is 5.7, hemoglobin 12.1, hematocrit 37.5, platelet count 230,000.  IMPRESSION:  Vickie Burns is a very charming 72 year old white female with stage I infiltrating ductal carcinoma of the right breast.  She has done very well.  There is no evidence of recurrent disease as far as I can tell.  For now, we will plan to get her back in another 6 months.  So anticipate that she will complete 5 years of Femara, in I think July of 2015.    ______________________________ Josph Macho, M.D. PRE/MEDQ  D:  09/27/2013  T:  10/02/2013  Job:  [id number]

## 2014-03-01 ENCOUNTER — Other Ambulatory Visit: Payer: Self-pay | Admitting: Gastroenterology

## 2014-03-14 ENCOUNTER — Encounter (HOSPITAL_COMMUNITY): Payer: Self-pay | Admitting: Pharmacy Technician

## 2014-03-25 ENCOUNTER — Encounter (HOSPITAL_COMMUNITY): Payer: Self-pay | Admitting: *Deleted

## 2014-03-28 ENCOUNTER — Ambulatory Visit (INDEPENDENT_AMBULATORY_CARE_PROVIDER_SITE_OTHER): Payer: Medicare Other | Admitting: Nurse Practitioner

## 2014-03-28 ENCOUNTER — Encounter: Payer: Self-pay | Admitting: Nurse Practitioner

## 2014-03-28 VITALS — BP 142/78 | HR 64 | Ht 69.25 in | Wt 223.0 lb

## 2014-03-28 DIAGNOSIS — Z01419 Encounter for gynecological examination (general) (routine) without abnormal findings: Secondary | ICD-10-CM

## 2014-03-28 NOTE — Progress Notes (Signed)
Patient ID: Vickie Burns, female   DOB: 09-20-1941, 73 y.o.   MRN: [id number] 73 y.o. G2P2 Widowed Caucasian Fe here for annual exam.  Still on Femara.for now 5 years, may be taking longer.  To see oncologist this week.  Last HGB AIC 8, and last lipid was normal.   Patient's last menstrual period was 12/12/1997.          Sexually active: no  The current method of family planning is none and post menopausal status.    Exercising: yes  walking Smoker:  no  Health Maintenance: Pap:  04/07/2008 MMG:  05/12/13, Bi-Rads 3: probable benign, 1 year follow up Colonoscopy:  01/2013 polyps recheck in 1 year, pt has apt 04/05/14 BMD:   03/2008 normal ? Endocrinologist is giving Reclast, will have repeat this year TDaP:  Up to date, per pt.   Shingles: about 5+ years ago. Labs: Cardiologist and endocrinologist, PCP does lab (blood) work.    reports that she quit smoking about 22 years ago. Her smoking use included Cigarettes. She has a 2.5 pack-year smoking history. She has never used smokeless tobacco. She reports that she does not drink alcohol or use illicit drugs.  Past Medical History  Diagnosis Date  . Family history of breast cancer     aunt  . Diabetes mellitus 2000  . Hypercholesteremia 2000  . Hypertension   . Breast CA 04/2009    right - radiation and lumpectomy  . Postmenopausal     took HRT 1999 - 2004    Past Surgical History  Procedure Laterality Date  . Breast lumpectomy  04/2009    rt breast  estrogen +, Her 2 Nu negative    Current Outpatient Prescriptions  Medication Sig Dispense Refill  . aspirin 81 MG tablet Take 81 mg by mouth daily.        . Cholecalciferol (VITAMIN D-3 PO) Take 2,000 Units by mouth.       . Cyanocobalamin (B-12 PO) Take 1,000 mg by mouth daily.       Marland Kitchen ezetimibe (ZETIA) 10 MG tablet Take 10 mg by mouth every evening.       . gabapentin (NEURONTIN) 600 MG tablet Take 600 mg by mouth 3 (three) times daily.      . Insulin Aspart Prot & Aspart (NOVOLOG  MIX 70/30 FLEXPEN [place]) Inject 35 Units into the skin 2 (two) times daily. Takes in morning and again in afternoo      . letrozole (FEMARA) 2.5 MG tablet Take 2.5 mg by mouth daily.      Marland Kitchen lisinopril (PRINIVIL,ZESTRIL) 2.5 MG tablet Take 2.5 mg by mouth every morning.       . metFORMIN (GLUCOPHAGE) 1000 MG tablet Take 1,000 mg by mouth 2 (two) times daily with a meal.      . Multiple Vitamin (MULTIVITAMIN WITH MINERALS) TABS tablet Take 0.5 tablets by mouth 2 (two) times daily.      . Omega-3 Fatty Acids (FISH OIL PO) Take 1,200 mg by mouth 2 (two) times daily.       . rosuvastatin (CRESTOR) 40 MG tablet Take 40 mg by mouth daily.         No current facility-administered medications for this visit.    Family History  Problem Relation Age of Onset  . Heart disease Mother     Confestive Heart Failure  . Heart failure Mother   . Diabetes Father   . Heart disease Father     Congestive Heart  Failure  . Heart failure Father   . Obesity Brother     ROS:  Pertinent items are noted in HPI.  Otherwise, a comprehensive ROS was negative.  Exam:   BP 142/78  Pulse 64  Ht 5' 9.25" (1.759 m)  Wt 223 lb (101.152 kg)  BMI 32.69 kg/m2  LMP 12/12/1997 Height: 5' 9.25" (175.9 cm)  Ht Readings from Last 3 Encounters:  03/28/14 5' 9.25" (1.759 m)  09/27/13 5\' 10"  (1.778 m)  07/08/13 5\' 10"  (1.778 m)    General appearance: alert, cooperative and appears stated age Head: Normocephalic, without obvious abnormality, atraumatic Neck: no adenopathy, supple, symmetrical, trachea midline and thyroid normal to inspection and palpation Lungs: clear to auscultation bilaterally Breasts: normal appearance, no masses or tenderness, positive findings: riaght breast surgical changes Heart: regular rate and rhythm Abdomen: soft, non-tender; no masses,  no organomegaly Extremities: extremities normal, atraumatic, no cyanosis or edema Skin: Skin color, texture, turgor normal. No rashes or lesions Lymph nodes:  Cervical, supraclavicular, and axillary nodes normal. No abnormal inguinal nodes palpated Neurologic: Grossly normal   Pelvic: External genitalia:  no lesions              Urethra:  normal appearing urethra with no masses, tenderness or lesions              Bartholin's and Skene's: normal                 Vagina: normal appearing vagina with normal color and discharge, no lesions              Cervix: anteverted              Pap taken: no Bimanual Exam:  Uterus:  normal size, contour, position, consistency, mobility, non-tender              Adnexa: no mass, fullness, tenderness               Rectovaginal: Confirms               Anus:  normal sphincter tone, no lesions  A:  Well Woman with normal exam  Postmenopausal - HRT 1999- 2004  S/P right breast cancer with lumpectomy 2010, Her 2 Nu negative  P:   Reviewed health and wellness pertinent to exam  Pap smear not taken today  Mammogram is due 7/15  Counseled on breast self exam, mammography screening, adequate intake of calcium and vitamin D, diet and exercise return annually or prn  An After Visit Summary was printed and given to the patient.

## 2014-03-28 NOTE — Patient Instructions (Addendum)

## 2014-03-29 ENCOUNTER — Ambulatory Visit: Payer: Medicare Other | Admitting: Hematology & Oncology

## 2014-03-29 ENCOUNTER — Other Ambulatory Visit: Payer: Medicare Other | Admitting: Lab

## 2014-03-30 ENCOUNTER — Other Ambulatory Visit (HOSPITAL_BASED_OUTPATIENT_CLINIC_OR_DEPARTMENT_OTHER): Payer: Medicare Other | Admitting: Lab

## 2014-03-30 ENCOUNTER — Ambulatory Visit (HOSPITAL_BASED_OUTPATIENT_CLINIC_OR_DEPARTMENT_OTHER): Payer: Medicare Other | Admitting: Hematology & Oncology

## 2014-03-30 ENCOUNTER — Encounter: Payer: Self-pay | Admitting: Hematology & Oncology

## 2014-03-30 ENCOUNTER — Other Ambulatory Visit: Payer: Self-pay | Admitting: Hematology & Oncology

## 2014-03-30 VITALS — BP 126/61 | HR 73 | Temp 98.1°F | Resp 14 | Ht 69.0 in | Wt 224.0 lb

## 2014-03-30 DIAGNOSIS — M818 Other osteoporosis without current pathological fracture: Secondary | ICD-10-CM

## 2014-03-30 DIAGNOSIS — C50419 Malignant neoplasm of upper-outer quadrant of unspecified female breast: Secondary | ICD-10-CM

## 2014-03-30 DIAGNOSIS — C50911 Malignant neoplasm of unspecified site of right female breast: Secondary | ICD-10-CM

## 2014-03-30 DIAGNOSIS — Z1231 Encounter for screening mammogram for malignant neoplasm of breast: Secondary | ICD-10-CM

## 2014-03-30 DIAGNOSIS — E119 Type 2 diabetes mellitus without complications: Secondary | ICD-10-CM

## 2014-03-30 DIAGNOSIS — T386X5A Adverse effect of antigonadotrophins, antiestrogens, antiandrogens, not elsewhere classified, initial encounter: Principal | ICD-10-CM

## 2014-03-30 LAB — CBC WITH DIFFERENTIAL (CANCER CENTER ONLY)
BASO#: 0 10*3/uL (ref 0.0–0.2)
BASO%: 0.4 % (ref 0.0–2.0)
EOS%: 3.8 % (ref 0.0–7.0)
Eosinophils Absolute: 0.3 10*3/uL (ref 0.0–0.5)
HEMATOCRIT: 39.5 % (ref 34.8–46.6)
HGB: 13.2 g/dL (ref 11.6–15.9)
LYMPH#: 1.4 10*3/uL (ref 0.9–3.3)
LYMPH%: 19.9 % (ref 14.0–48.0)
MCH: 32 pg (ref 26.0–34.0)
MCHC: 33.4 g/dL (ref 32.0–36.0)
MCV: 96 fL (ref 81–101)
MONO#: 0.9 10*3/uL (ref 0.1–0.9)
MONO%: 12.8 % (ref 0.0–13.0)
NEUT#: 4.4 10*3/uL (ref 1.5–6.5)
NEUT%: 63.1 % (ref 39.6–80.0)
Platelets: 226 10*3/uL (ref 145–400)
RBC: 4.12 10*6/uL (ref 3.70–5.32)
RDW: 13.3 % (ref 11.1–15.7)
WBC: 6.9 10*3/uL (ref 3.9–10.0)

## 2014-03-30 LAB — CMP (CANCER CENTER ONLY)
ALT: 25 U/L (ref 10–47)
AST: 28 U/L (ref 11–38)
Albumin: 3.4 g/dL (ref 3.3–5.5)
Alkaline Phosphatase: 43 U/L (ref 26–84)
BILIRUBIN TOTAL: 0.7 mg/dL (ref 0.20–1.60)
BUN: 12 mg/dL (ref 7–22)
CO2: 30 meq/L (ref 18–33)
CREATININE: 0.6 mg/dL (ref 0.6–1.2)
Calcium: 9.9 mg/dL (ref 8.0–10.3)
Chloride: 102 mEq/L (ref 98–108)
Glucose, Bld: 55 mg/dL — ABNORMAL LOW (ref 73–118)
Potassium: 4.2 mEq/L (ref 3.3–4.7)
Sodium: 144 mEq/L (ref 128–145)
Total Protein: 6.7 g/dL (ref 6.4–8.1)

## 2014-03-30 NOTE — Progress Notes (Signed)
  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  CURRENT THERAPY:  Femara 2.5 mg p.o. q. day, to finish up in July of 2015.  INTERIM HISTORY:  Vickie Burns comes in for followup.  She is doing quite well.  We last saw her back in December.  She had no problems over the Wintertime  She is getting ready for the summer..  She is looking forward to this.  She is still not able to move to [place]. A coworker has had back surgery. As such, Vickie Burns has to stay in town. She is not sure when she will do this.  She says that her knees are feeling better.  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  She is worried about circulation.  I told her that from my point of view, she is doing well with the circulation.  I told the main issue with her is going to be her diabetes.  She needs to get this under good control to minimize peripheral arterial issues.  She has had no problems with nausea or vomiting.  She has had no fever, sweats, or chills.  She has had no headache.  She has had no rashes. She has had no weight loss or weight gain.  PHYSICAL EXAMINATION:  General:  This is a well-developed, well- nourished, white female, in no obvious distress.  Vital Signs: Temperature 98.1 pulse 73, respiratory rate 14, blood pressure 126/61. Weight is 224 pounds.  Head and Neck:  Normocephalic and atraumatic skull.  There are no ocular or oral lesions.  There are no palpable cervical or supraclavicular lymph nodes.  Lungs:  Clear bilaterally. Cardiac:  Regular rate and rhythm with a normal S1 and S2.  There are no murmurs, rubs, or bruits.  Breasts Exam:  Left breast with no masses, edema, or erythema.  There is no left axillary adenopathy.  Right breast shows a well-healed lumpectomy at the 10 o'clock position.  No masses noted in the right breast.  There is no right axillary adenopathy. Abdomen:  Soft.  She has good bowel sounds.  There is no fluid  wave. There is no palpable hepatosplenomegaly.  Back:  No kyphosis or osteoporotic changes.  She has no tenderness over the spine, ribs, or hips.  Extremities:  No clubbing, cyanosis, or edema.  She has good pulses in distal extremities.  LABORATORY STUDIES:  White cell count is 6.9, hemoglobin 13.2 hematocrit 39.5, platelet count 226.  IMPRESSION:  Vickie Burns is a very charming 73 year old white female with stage I infiltrating ductal carcinoma of the right breast.  She has done very well.  There is no evidence of recurrent disease as far as I can tell.  She will finish up her Femara in July. I told her that 5 years all that she needs. Afterwards, I don't think there would be any additional benefit given that she only had stage I disease.  For now, we will plan to get her back in another 6 months.  We will give her Zometa we see her back.

## 2014-04-04 NOTE — Progress Notes (Signed)
Encounter reviewed by Dr. Brook Silva.  

## 2014-04-05 ENCOUNTER — Ambulatory Visit (HOSPITAL_COMMUNITY)
Admission: RE | Admit: 2014-04-05 | Discharge: 2014-04-05 | Disposition: A | Discharge status: Home / Self Care | Payer: Medicare Other | Source: Ambulatory Visit | Attending: Gastroenterology | Admitting: Gastroenterology

## 2014-04-05 ENCOUNTER — Encounter (HOSPITAL_COMMUNITY): Payer: Medicare Other | Admitting: Anesthesiology

## 2014-04-05 ENCOUNTER — Encounter (HOSPITAL_COMMUNITY): Payer: Self-pay | Admitting: Anesthesiology

## 2014-04-05 ENCOUNTER — Ambulatory Visit (HOSPITAL_COMMUNITY): Payer: Medicare Other | Admitting: Anesthesiology

## 2014-04-05 ENCOUNTER — Encounter (HOSPITAL_COMMUNITY)
Admission: RE | Disposition: A | Discharge status: Home / Self Care | Payer: Self-pay | Source: Ambulatory Visit | Attending: Gastroenterology

## 2014-04-05 DIAGNOSIS — Z860100 Personal history of colon polyps, unspecified: Secondary | ICD-10-CM | POA: Insufficient documentation

## 2014-04-05 DIAGNOSIS — Z853 Personal history of malignant neoplasm of breast: Secondary | ICD-10-CM | POA: Insufficient documentation

## 2014-04-05 DIAGNOSIS — E1149 Type 2 diabetes mellitus with other diabetic neurological complication: Secondary | ICD-10-CM | POA: Insufficient documentation

## 2014-04-05 DIAGNOSIS — E1142 Type 2 diabetes mellitus with diabetic polyneuropathy: Secondary | ICD-10-CM | POA: Insufficient documentation

## 2014-04-05 DIAGNOSIS — D126 Benign neoplasm of colon, unspecified: Secondary | ICD-10-CM | POA: Insufficient documentation

## 2014-04-05 DIAGNOSIS — Z9010 Acquired absence of unspecified breast and nipple: Secondary | ICD-10-CM | POA: Insufficient documentation

## 2014-04-05 DIAGNOSIS — K5730 Diverticulosis of large intestine without perforation or abscess without bleeding: Secondary | ICD-10-CM | POA: Insufficient documentation

## 2014-04-05 DIAGNOSIS — I10 Essential (primary) hypertension: Secondary | ICD-10-CM | POA: Insufficient documentation

## 2014-04-05 DIAGNOSIS — E7800 Pure hypercholesterolemia, unspecified: Secondary | ICD-10-CM | POA: Insufficient documentation

## 2014-04-05 HISTORY — PX: COLONOSCOPY WITH PROPOFOL: SHX5780

## 2014-04-05 LAB — GLUCOSE, CAPILLARY
GLUCOSE-CAPILLARY: 126 mg/dL — AB (ref 70–99)
Glucose-Capillary: 87 mg/dL (ref 70–99)

## 2014-04-05 SURGERY — COLONOSCOPY WITH PROPOFOL
Anesthesia: Monitor Anesthesia Care

## 2014-04-05 MED ORDER — ONDANSETRON HCL 4 MG/2ML IJ SOLN
INTRAMUSCULAR | Status: DC | PRN
  Administered 2014-04-05: 4 mg via INTRAVENOUS

## 2014-04-05 MED ORDER — SODIUM CHLORIDE 0.9 % IV SOLN: INTRAVENOUS | Status: DC

## 2014-04-05 MED ORDER — MIDAZOLAM HCL 2 MG/2ML IJ SOLN
INTRAMUSCULAR | Status: AC
  Filled 2014-04-05: qty 2

## 2014-04-05 MED ORDER — PHENYLEPHRINE HCL 10 MG/ML IJ SOLN
INTRAMUSCULAR | Status: DC | PRN
  Administered 2014-04-05 (×2): 40 ug via INTRAVENOUS

## 2014-04-05 MED ORDER — METOCLOPRAMIDE HCL 5 MG/ML IJ SOLN
INTRAMUSCULAR | Status: DC | PRN
  Administered 2014-04-05: 10 mg via INTRAVENOUS

## 2014-04-05 MED ORDER — ESMOLOL HCL 10 MG/ML IV SOLN
INTRAVENOUS | Status: DC | PRN
  Administered 2014-04-05 (×2): 5 mg via INTRAVENOUS

## 2014-04-05 MED ORDER — LIDOCAINE HCL (CARDIAC) 20 MG/ML IV SOLN
INTRAVENOUS | Status: DC | PRN
  Administered 2014-04-05: 50 mg via INTRAVENOUS

## 2014-04-05 MED ORDER — GLYCOPYRROLATE 0.2 MG/ML IJ SOLN
INTRAMUSCULAR | Status: DC | PRN
  Administered 2014-04-05 (×2): 0.1 mg via INTRAVENOUS

## 2014-04-05 MED ORDER — METOCLOPRAMIDE HCL 5 MG/ML IJ SOLN
INTRAMUSCULAR | Status: AC
  Filled 2014-04-05: qty 2

## 2014-04-05 MED ORDER — ONDANSETRON HCL 4 MG/2ML IJ SOLN
INTRAMUSCULAR | Status: AC
  Filled 2014-04-05: qty 2

## 2014-04-05 MED ORDER — LIDOCAINE HCL (CARDIAC) 20 MG/ML IV SOLN
INTRAVENOUS | Status: AC
  Filled 2014-04-05: qty 5

## 2014-04-05 MED ORDER — KETAMINE HCL 10 MG/ML IJ SOLN
INTRAMUSCULAR | Status: DC | PRN
  Administered 2014-04-05 (×4): 1 mg via INTRAVENOUS
  Administered 2014-04-05: 5 mg via INTRAVENOUS
  Administered 2014-04-05 (×11): 1 mg via INTRAVENOUS

## 2014-04-05 MED ORDER — PROPOFOL INFUSION 10 MG/ML OPTIME
INTRAVENOUS | Status: DC | PRN
  Administered 2014-04-05: 75 ug/kg/min via INTRAVENOUS

## 2014-04-05 MED ORDER — PROPOFOL 10 MG/ML IV BOLUS
INTRAVENOUS | Status: AC
  Filled 2014-04-05: qty 40

## 2014-04-05 MED ORDER — KETAMINE HCL 10 MG/ML IJ SOLN
INTRAMUSCULAR | Status: AC
  Filled 2014-04-05: qty 1

## 2014-04-05 MED ORDER — GLYCOPYRROLATE 0.2 MG/ML IJ SOLN
INTRAMUSCULAR | Status: AC
  Filled 2014-04-05: qty 1

## 2014-04-05 MED ORDER — LACTATED RINGERS IV SOLN
INTRAVENOUS | Status: DC
  Administered 2014-04-05: 10:00:00 via INTRAVENOUS

## 2014-04-05 MED ORDER — PROPOFOL 10 MG/ML IV BOLUS
INTRAVENOUS | Status: DC | PRN
  Administered 2014-04-05: 50 mg via INTRAVENOUS

## 2014-04-05 SURGICAL SUPPLY — 22 items

## 2014-04-05 NOTE — Transfer of Care (Signed)
Immediate Anesthesia Transfer of Care Note  Patient: Vickie Burns  Procedure(s) Performed: Procedure(s): COLONOSCOPY WITH PROPOFOL (N/A)  Patient Location: PACU, Endo Recovery  Anesthesia Type:MAC  Level of Consciousness: Patient easily awoken, sedated, comfortable, cooperative, following commands, responds to stimulation.   Airway & Oxygen Therapy: Patient spontaneously breathing, ventilating well, oxygen via simple oxygen mask.  Post-op Assessment: Report given to PACU RN, vital signs reviewed and stable, moving all extremities.   Post vital signs: Reviewed and stable.  Complications: No apparent anesthesia complications

## 2014-04-05 NOTE — H&P (Signed)
  Procedure: Surveillance colonoscopy. 02/02/2013 colonoscopy performed with removal of sessile serrated adenomatous colon polyps  History: The patient is a 73 year old female born 04-15-1941. On 02/02/2013 the patient underwent a surveillance colonoscopy with removal of a 12 mm sessile serrated adenomatous polyp from the proximal ascending colon and an 8 mm sessile serrated adenomatous polyp from the mid transverse colon.  The patient is scheduled to undergo a surveillance colonoscopy today  Past medical history: Right breast cancer. Type 2 diabetes mellitus. Hypertension. Hypercholesterolemia. Diabetic neuropathy. Partial right mastectomy with sentinel node biopsy in July 2010.  Medication allergies: None  Exam: The patient is alert and lying comfortably on the endoscopy stretcher. Abdomen is soft and nontender to palpation. Lungs are clear to auscultation. Cardiac exam reveals a regular rhythm  Plan: Proceed with surveillance colonoscopy

## 2014-04-05 NOTE — Op Note (Signed)
Procedure: Surveillance colonoscopy  Endoscopist: Earle Gell  Premedication: Propofol administered by anesthesia  Procedure: The patient was placed in the left lateral decubitus position. Anal inspection and digital rectal exam were normal. The Pentax pediatric colonoscope was introduced into the rectum and with difficulty due to colonic loop formation and eventually advanced to the cecum. A normal-appearing ileocecal valve and appendiceal orifice were identified. Colonic preparation for the exam today was good.  Rectum. Normal. Retroflex view of the distal rectum normal  Sigmoid colon and descending colon. Extensive left colonic diverticulosis  Splenic flexure. Normal  Transverse colon. Normal  Hepatic flexure. Normal  Ascending colon. Normal  Cecum and ileocecal valve. From the proximal cecum, a 7 mm sessile serrated appearing polyp was removed in piecemeal fashion with the cold snare and submitted for pathological interpretation  Assessment:  #1. Extensive left colonic diverticulosis  #2. From the proximal cecum, a 7 mm sessile serrated appearing polyp was removed with the cold snare  #3. A technically very difficult colonoscopy to perform

## 2014-04-05 NOTE — Anesthesia Postprocedure Evaluation (Signed)
  Anesthesia Post-op Note  Patient: Vickie Burns  Procedure(s) Performed: Procedure(s) (LRB): COLONOSCOPY WITH PROPOFOL (N/A)  Patient Location: PACU  Anesthesia Type: MAC  Level of Consciousness: awake and alert   Airway and Oxygen Therapy: Patient Spontanous Breathing  Post-op Pain: mild  Post-op Assessment: Post-op Vital signs reviewed, Patient's Cardiovascular Status Stable, Respiratory Function Stable, Patent Airway and No signs of Nausea or vomiting  Last Vitals:  Filed Vitals:   04/05/14 1111  BP: 119/55  Pulse: 62  Temp: 36.2 C  Resp: 14    Post-op Vital Signs: stable   Complications: No apparent anesthesia complications

## 2014-04-05 NOTE — Discharge Instructions (Signed)
Colonoscopy °Care After °These instructions give you information on caring for yourself after your procedure. Your doctor may also give you more specific instructions. Call your doctor if you have any problems or questions after your procedure. °HOME CARE °· Take it easy for the next 24 hours. °· Rest. °· Walk or use warm packs on your belly (abdomen) if you have belly cramping or gas. °· Do not drive for 24 hours. °· You may shower. °· Do not sign important papers or use machinery for 24 hours. °· Drink enough fluids to keep your pee (urine) clear or pale yellow. °· Resume your normal diet. Avoid heavy or fried foods. °· Avoid alcohol. °· Continue taking your normal medicines. °· Only take medicine as told by your doctor. Do not take aspirin. °If you had growths (polyps) removed: °· Do not take aspirin. °· Do not drink alcohol for 7 days or as told by your doctor. °· Eat a soft diet for 24 hours. °GET HELP RIGHT AWAY IF: °· You have a fever. °· You pass clumps of tissue (blood clots) or fill the toilet with blood. °· You have belly pain that gets worse and medicine does not help. °· Your belly is puffy (swollen). °· You feel sick to your stomach (nauseous) or throw up (vomit). °MAKE SURE YOU: °· Understand these instructions. °· Will watch your condition. °· Will get help right away if you are not doing well or get worse. °Document Released: 11/30/2010 Document Revised: 01/20/2012 Document Reviewed: 07/05/2013 °ExitCare® Patient Information ©2014 [employer]. ° °

## 2014-04-05 NOTE — Anesthesia Preprocedure Evaluation (Signed)
Anesthesia Evaluation  Patient identified by MRN, date of birth, ID band Patient awake    Reviewed: Allergy & Precautions, H&P , NPO status , Patient's Chart, lab work & pertinent test results  Airway Mallampati: II TM Distance: >3 FB Neck ROM: Full    Dental no notable dental hx.    Pulmonary former smoker,  breath sounds clear to auscultation  Pulmonary exam normal       Cardiovascular hypertension, Rhythm:Regular Rate:Normal     Neuro/Psych negative neurological ROS  negative psych ROS   GI/Hepatic negative GI ROS, Neg liver ROS,   Endo/Other  diabetes, Type 2, Oral Hypoglycemic Agents  Renal/GU negative Renal ROS  negative genitourinary   Musculoskeletal negative musculoskeletal ROS (+)   Abdominal (+) + obese,   Peds negative pediatric ROS (+)  Hematology negative hematology ROS (+)   Anesthesia Other Findings   Reproductive/Obstetrics negative OB ROS                           Anesthesia Physical Anesthesia Plan  ASA: II  Anesthesia Plan: MAC   Post-op Pain Management:    Induction: Intravenous  Airway Management Planned:   Additional Equipment:   Intra-op Plan:   Post-operative Plan:   Informed Consent: I have reviewed the patients History and Physical, chart, labs and discussed the procedure including the risks, benefits and alternatives for the proposed anesthesia with the patient or authorized representative who has indicated his/her understanding and acceptance.   Dental advisory given  Plan Discussed with: CRNA  Anesthesia Plan Comments:         Anesthesia Quick Evaluation

## 2014-04-06 ENCOUNTER — Encounter (HOSPITAL_COMMUNITY): Payer: Self-pay | Admitting: Gastroenterology

## 2014-04-20 HISTORY — PX: ORIF FOOT FRACTURE: SHX2123

## 2014-04-28 ENCOUNTER — Other Ambulatory Visit: Payer: Medicare Other

## 2014-06-27 ENCOUNTER — Other Ambulatory Visit: Payer: Self-pay | Admitting: Hematology & Oncology

## 2014-07-19 ENCOUNTER — Other Ambulatory Visit: Payer: Self-pay

## 2014-07-19 ENCOUNTER — Other Ambulatory Visit: Payer: Self-pay | Admitting: Hematology & Oncology

## 2014-07-19 DIAGNOSIS — Z1231 Encounter for screening mammogram for malignant neoplasm of breast: Secondary | ICD-10-CM

## 2014-09-01 ENCOUNTER — Ambulatory Visit (INDEPENDENT_AMBULATORY_CARE_PROVIDER_SITE_OTHER): Payer: Medicare Other | Admitting: General Surgery

## 2014-09-12 ENCOUNTER — Encounter (HOSPITAL_COMMUNITY): Payer: Self-pay | Admitting: Gastroenterology

## 2014-09-20 ENCOUNTER — Ambulatory Visit
Admission: RE | Admit: 2014-09-20 | Discharge: 2014-09-20 | Disposition: A | Discharge status: Home / Self Care | Payer: Medicare Other | Source: Ambulatory Visit | Attending: Hematology & Oncology | Admitting: Hematology & Oncology

## 2014-09-20 ENCOUNTER — Other Ambulatory Visit: Payer: Self-pay | Admitting: Hematology & Oncology

## 2014-09-20 ENCOUNTER — Encounter (INDEPENDENT_AMBULATORY_CARE_PROVIDER_SITE_OTHER): Payer: Self-pay

## 2014-09-20 DIAGNOSIS — M818 Other osteoporosis without current pathological fracture: Secondary | ICD-10-CM

## 2014-09-20 DIAGNOSIS — Z853 Personal history of malignant neoplasm of breast: Secondary | ICD-10-CM

## 2014-09-20 DIAGNOSIS — Z9889 Other specified postprocedural states: Secondary | ICD-10-CM

## 2014-09-20 DIAGNOSIS — T386X5A Adverse effect of antigonadotrophins, antiestrogens, antiandrogens, not elsewhere classified, initial encounter: Principal | ICD-10-CM

## 2014-09-22 ENCOUNTER — Telehealth: Payer: Self-pay | Admitting: *Deleted

## 2014-09-22 NOTE — Telephone Encounter (Signed)
-----   Message from Volanda Napoleon, MD sent at 09/21/2014  5:42 PM EST ----- Please call and let her know that the bone density is normal. Keep taking vitamin D and calcium. Laurey Arrow

## 2014-09-27 ENCOUNTER — Encounter: Payer: Self-pay | Admitting: Hematology & Oncology

## 2014-09-28 ENCOUNTER — Ambulatory Visit (HOSPITAL_BASED_OUTPATIENT_CLINIC_OR_DEPARTMENT_OTHER): Payer: Medicare Other | Admitting: Hematology & Oncology

## 2014-09-28 ENCOUNTER — Encounter: Payer: Self-pay | Admitting: Hematology & Oncology

## 2014-09-28 ENCOUNTER — Ambulatory Visit (HOSPITAL_BASED_OUTPATIENT_CLINIC_OR_DEPARTMENT_OTHER): Payer: Medicare Other

## 2014-09-28 ENCOUNTER — Other Ambulatory Visit (HOSPITAL_BASED_OUTPATIENT_CLINIC_OR_DEPARTMENT_OTHER): Payer: Medicare Other | Admitting: Lab

## 2014-09-28 VITALS — BP 110/58 | HR 65 | Temp 97.4°F | Resp 14 | Ht 69.0 in | Wt 203.0 lb

## 2014-09-28 DIAGNOSIS — T386X5A Adverse effect of antigonadotrophins, antiestrogens, antiandrogens, not elsewhere classified, initial encounter: Principal | ICD-10-CM

## 2014-09-28 DIAGNOSIS — Z853 Personal history of malignant neoplasm of breast: Secondary | ICD-10-CM

## 2014-09-28 DIAGNOSIS — C50912 Malignant neoplasm of unspecified site of left female breast: Secondary | ICD-10-CM

## 2014-09-28 DIAGNOSIS — M818 Other osteoporosis without current pathological fracture: Secondary | ICD-10-CM

## 2014-09-28 LAB — CMP (CANCER CENTER ONLY)
ALT: 26 U/L (ref 10–47)
AST: 17 U/L (ref 11–38)
Albumin: 3.6 g/dL (ref 3.3–5.5)
Alkaline Phosphatase: 46 U/L (ref 26–84)
BILIRUBIN TOTAL: 0.6 mg/dL (ref 0.20–1.60)
BUN, Bld: 14 mg/dL (ref 7–22)
CALCIUM: 9.7 mg/dL (ref 8.0–10.3)
CHLORIDE: 98 meq/L (ref 98–108)
CO2: 27 meq/L (ref 18–33)
CREATININE: 1 mg/dL (ref 0.6–1.2)
Glucose, Bld: 99 mg/dL (ref 73–118)
Potassium: 4 mEq/L (ref 3.3–4.7)
SODIUM: 144 meq/L (ref 128–145)
TOTAL PROTEIN: 6.8 g/dL (ref 6.4–8.1)

## 2014-09-28 LAB — CBC WITH DIFFERENTIAL (CANCER CENTER ONLY)
BASO#: 0 10*3/uL (ref 0.0–0.2)
BASO%: 0.4 % (ref 0.0–2.0)
EOS%: 5.5 % (ref 0.0–7.0)
Eosinophils Absolute: 0.4 10*3/uL (ref 0.0–0.5)
HCT: 39 % (ref 34.8–46.6)
HGB: 12.8 g/dL (ref 11.6–15.9)
LYMPH#: 1.4 10*3/uL (ref 0.9–3.3)
LYMPH%: 19.9 % (ref 14.0–48.0)
MCH: 30.2 pg (ref 26.0–34.0)
MCHC: 32.8 g/dL (ref 32.0–36.0)
MCV: 92 fL (ref 81–101)
MONO#: 0.6 10*3/uL (ref 0.1–0.9)
MONO%: 8.4 % (ref 0.0–13.0)
NEUT#: 4.5 10*3/uL (ref 1.5–6.5)
NEUT%: 65.8 % (ref 39.6–80.0)
PLATELETS: 252 10*3/uL (ref 145–400)
RBC: 4.24 10*6/uL (ref 3.70–5.32)
RDW: 14.2 % (ref 11.1–15.7)
WBC: 6.8 10*3/uL (ref 3.9–10.0)

## 2014-09-28 MED ORDER — SODIUM CHLORIDE 0.9 % IV SOLN
Freq: Once | INTRAVENOUS | Status: AC
  Administered 2014-09-28: 12:00:00 via INTRAVENOUS

## 2014-09-28 MED ORDER — ZOLEDRONIC ACID 4 MG/100ML IV SOLN
4.0000 mg | Freq: Once | INTRAVENOUS | Status: AC
  Administered 2014-09-28: 4 mg via INTRAVENOUS
  Filled 2014-09-28: qty 100

## 2014-09-28 NOTE — Progress Notes (Signed)
Hematology and Oncology Follow Up Visit  Vickie Burns [id number] December 16, 1940 73 y.o. 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  Current Therapy:    Patient completed 5 years of Femara in October 2015  Zometa 4 mg IV every year     Interim History:  Ms.  Boulos is back for follow-up. Last saw her back in May., Unfortunately in July, she had a episode of hypo-glycemia. She passed out and broke her right foot. She was in a rehabilitation center for this. She is in for about one month. She is doing quite well right now.  She, otherwise, has been doing pretty well. She has had no other problems. She is glad to have stop the Femara.  She's had no problem with bowels or bladder. She's had no cough. She's had no rashes. She's had no headache.    Overall, her performance status is ECOG 1  Medications: Current outpatient prescriptions: aspirin 81 MG tablet, Take 81 mg by mouth daily.  , Disp: , Rfl: ;  Cholecalciferol (VITAMIN D-3 PO), Take 2,000 Units by mouth. , Disp: , Rfl: ;  Cyanocobalamin (B-12 PO), Take 1,000 mg by mouth daily. , Disp: , Rfl: ;  ezetimibe (ZETIA) 10 MG tablet, Take 10 mg by mouth every evening. , Disp: , Rfl: ;  gabapentin (NEURONTIN) 600 MG tablet, Take 600 mg by mouth 3 (three) times daily., Disp: , Rfl:  Insulin Aspart Prot & Aspart (NOVOLOG MIX 70/30 FLEXPEN [place]), Inject into the skin 2 (two) times daily. 30 IN AM & 30 IN PM, Disp: , Rfl: ;  letrozole (FEMARA) 2.5 MG tablet, Take 1 tablet by mouth  daily, Disp: 90 tablet, Rfl: 3;  lisinopril (PRINIVIL,ZESTRIL) 2.5 MG tablet, Take 2.5 mg by mouth every morning. , Disp: , Rfl: ;  metFORMIN (GLUCOPHAGE) 1000 MG tablet, Take 1,000 mg by mouth 2 (two) times daily with a meal., Disp: , Rfl:  Multiple Vitamin (MULTIVITAMIN WITH MINERALS) TABS tablet, Take 0.5 tablets by mouth 2 (two) times daily., Disp: , Rfl: ;  Omega-3 Fatty Acids (FISH OIL PO), Take 1,200 mg  by mouth 2 (two) times daily. , Disp: , Rfl: ;  rosuvastatin (CRESTOR) 40 MG tablet, Take 40 mg by mouth daily.  , Disp: , Rfl:   Allergies: No Known Allergies  Past Medical History, Surgical history, Social history, and Family History were reviewed and updated.  Review of Systems: As above  Physical Exam:  height is 5\' 9"  (1.753 m) and weight is 203 lb (92.08 kg). Her oral temperature is 97.4 F (36.3 C). Her blood pressure is 110/58 and her pulse is 65. Her respiration is 14.   Well-developed and well-nourished white female in no obvious Distress. Head and neck exam shows no ocular or oral lesions. She has a palpable cervical or supraclavicular lymph nodes. Lungs are clear. Cardiac exam regular rate and rhythm with no murmurs, rubs or bruits. Abdomen is soft. Has good bowel sounds. There is no fluid wave. There is some scattered ecchymoses from the insulin injections. There is no palpable liver or spleen tip. Breast exam shows left breast with no masses, edema or erythema. There is no left axillary adenopathy. Right breast shows a well-healed lumpectomy at the 10:00 position. No masses, edema or erythema is noted in the right breast. There is no right axillary adenopathy. Extremities shows no clubbing, cyanosis or edema. Her right foot is still a little tender to  palpation. Neurological exam is nonfocal.  Lab Results  Component Value Date   WBC 6.8 09/28/2014   HGB 12.8 09/28/2014   HCT 39.0 09/28/2014   MCV 92 09/28/2014   PLT 252 09/28/2014     Chemistry      Component Value Date/Time   NA 144 09/28/2014 1011   NA 134* 09/27/2013 1001   K 4.0 09/28/2014 1011   K 4.4 09/27/2013 1001   CL 98 09/28/2014 1011   CL 103 09/27/2013 1001   CO2 27 09/28/2014 1011   CO2 25 09/27/2013 1001   BUN 14 09/28/2014 1011   BUN 13 09/27/2013 1001   CREATININE 1.0 09/28/2014 1011   CREATININE 0.80 09/27/2013 1001      Component Value Date/Time   CALCIUM 9.7 09/28/2014 1011   CALCIUM 9.9  09/27/2013 1001   ALKPHOS 46 09/28/2014 1011   ALKPHOS 43 09/27/2013 1001   AST 17 09/28/2014 1011   AST 23 09/27/2013 1001   ALT 26 09/28/2014 1011   ALT 19 09/27/2013 1001   BILITOT 0.60 09/28/2014 1011   BILITOT 0.5 09/27/2013 1001         Impression and Plan: Ms. Sorenson is 73 year old white female. 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 She has done very well. She completed 5 years of Femara in October.  I this point, I think that we can probably do the Zometa once a year.  I think we get her back once a year.   Volanda Napoleon, MD 11/18/201511:37 AM

## 2014-09-28 NOTE — Patient Instructions (Signed)

## 2014-09-29 ENCOUNTER — Encounter: Payer: Self-pay | Admitting: Hematology & Oncology

## 2014-11-11 DIAGNOSIS — H3530 Unspecified macular degeneration: Secondary | ICD-10-CM

## 2014-11-11 HISTORY — DX: Unspecified macular degeneration: H35.30

## 2015-04-06 ENCOUNTER — Ambulatory Visit: Payer: Self-pay | Admitting: Nurse Practitioner

## 2015-05-24 ENCOUNTER — Ambulatory Visit (INDEPENDENT_AMBULATORY_CARE_PROVIDER_SITE_OTHER): Payer: Medicare Other | Admitting: Nurse Practitioner

## 2015-05-24 ENCOUNTER — Encounter: Payer: Self-pay | Admitting: Nurse Practitioner

## 2015-05-24 VITALS — BP 126/64 | HR 64 | Ht 69.0 in | Wt 203.0 lb

## 2015-05-24 DIAGNOSIS — Z01419 Encounter for gynecological examination (general) (routine) without abnormal findings: Secondary | ICD-10-CM

## 2015-05-24 DIAGNOSIS — I10 Essential (primary) hypertension: Secondary | ICD-10-CM | POA: Diagnosis not present

## 2015-05-24 DIAGNOSIS — C50911 Malignant neoplasm of unspecified site of right female breast: Secondary | ICD-10-CM | POA: Diagnosis not present

## 2015-05-24 DIAGNOSIS — E119 Type 2 diabetes mellitus without complications: Secondary | ICD-10-CM | POA: Diagnosis not present

## 2015-05-24 NOTE — Progress Notes (Signed)
Patient ID: Vickie Burns, female   DOB: [birth year], 74 y.o.   MRN: [id number] 74 y.o. G2P2 Widowed  Caucasian Fe here for annual exam.  Since here last year had a fracture of her right foot.  Last HGB AIC 6.9 last summer. To see oncologist and surgeon this fall.  She is now off Femara, has completed her 5 years of therapy.  Patient's last menstrual period was 12/12/1997.          Sexually active: No.  The current method of family planning is none.    Exercising: No.  The patient does not participate in regular exercise at present. Smoker:  no  Health Maintenance: Pap:  03/23/13, negative  MMG:  09/20/14, 3D, Bi-Rads 2:  Benign Colonoscopy:  04/05/14, adenomatous polyp  Repeat in 5 years BMD:   09/20/14, 1.4 S/0.3 L TDaP:  UTD Shingles age 66? Prevnar 13 08/20/2014 Labs: Dr. Felipa Eth   reports that she quit smoking about 23 years ago. Her smoking use included Cigarettes. She started smoking about 53 years ago. She has a 7.5 pack-year smoking history. She has never used smokeless tobacco. She reports that she does not drink alcohol or use illicit drugs.  Past Medical History  Diagnosis Date  . Family history of breast cancer     aunt  . Diabetes mellitus 2000  . Hypercholesteremia 2000  . Hypertension   . Breast CA 04/2009    right - radiation and lumpectomy  . Postmenopausal     took HRT 1999 - 2004  . 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 Breast lumpectomy  04/2009    rt breast  estrogen +, Her 2 Nu negative  . Colonoscopy with propofol N/A 04/05/2014    Procedure: COLONOSCOPY WITH PROPOFOL;  Surgeon: Garlan Fair, MD;  Location: [facility];  Service: Endoscopy;  Laterality: N/A;  . Orif foot fracture Right 04/20/2014    done in [place]    Current Outpatient Prescriptions  Medication Sig Dispense Refill  . aspirin 81 MG tablet Take 81 mg by mouth daily.      . Cholecalciferol (VITAMIN D-3 PO) Take 2,000  Units by mouth.     . Cyanocobalamin (B-12 PO) Take 1,000 mg by mouth daily.     Marland Kitchen ezetimibe (ZETIA) 10 MG tablet Take 10 mg by mouth every evening.     . gabapentin (NEURONTIN) 600 MG tablet Take 600 mg by mouth 3 (three) times daily.    Marland Kitchen HUMALOG MIX 75/25 KWIKPEN (75-25) 100 UNIT/ML Kwikpen Inject 35 Units into the skin 2 (two) times daily.    Marland Kitchen lisinopril (PRINIVIL,ZESTRIL) 2.5 MG tablet Take 2.5 mg by mouth every morning.     . metFORMIN (GLUCOPHAGE) 1000 MG tablet Take 1,000 mg by mouth 2 (two) times daily with a meal.    . Multiple Vitamin (MULTIVITAMIN WITH MINERALS) TABS tablet Take 0.5 tablets by mouth 2 (two) times daily.    . Omega-3 Fatty Acids (FISH OIL PO) Take 1,200 mg by mouth 2 (two) times daily.     . rosuvastatin (CRESTOR) 40 MG tablet Take 40 mg by mouth daily.       No current facility-administered medications for this visit.    Family History  Problem Relation Age of Onset  . Heart disease Mother     Confestive Heart Failure  . Heart failure Mother   . Diabetes Father   . Heart disease Father  Congestive Heart Failure  . Heart failure Father   . Obesity Brother     ROS:  Pertinent items are noted in HPI.  Otherwise, a comprehensive ROS was negative.  Exam:   BP 126/64 mmHg  Pulse 64  Ht 5\' 9"  (1.753 m)  Wt 203 lb (92.08 kg)  BMI 29.96 kg/m2  LMP 12/12/1997 Height: 5\' 9"  (175.3 cm) Ht Readings from Last 3 Encounters:  05/24/15 5\' 9"  (1.753 m)  09/28/14 5\' 9"  (1.753 m)  03/30/14 5\' 9"  (1.753 m)    General appearance: alert, cooperative and appears stated age Head: Normocephalic, without obvious abnormality, atraumatic Neck: no adenopathy, supple, symmetrical, trachea midline and thyroid normal to inspection and palpation Lungs: clear to auscultation bilaterally Breasts: normal appearance, no masses or tenderness, on the left.  Right breast with surgical and radiation changes. Heart: regular rate and rhythm Abdomen: soft, non-tender; no masses,   no organomegaly Extremities: extremities normal, atraumatic, no cyanosis or edema Skin: Skin color, texture, turgor normal. No rashes or lesions Lymph nodes: Cervical, supraclavicular, and axillary nodes normal. No abnormal inguinal nodes palpated Neurologic: Grossly normal   Pelvic: External genitalia:  no lesions              Urethra:  normal appearing urethra with no masses, tenderness or lesions              Bartholin's and Skene's: normal                 Vagina: normal appearing vagina with normal color and discharge, no lesions              Cervix: anteverted              Pap taken: No. Bimanual Exam:  Uterus:  normal size, contour, position, consistency, mobility, non-tender              Adnexa: no mass, fullness, tenderness               Rectovaginal: Confirms               Anus:  normal sphincter tone, no lesions  Chaperone present:  no  A:  Well Woman with normal exam  Postmenopausal - HRT 1999- 2004 S/P right breast cancer with lumpectomy 2010, Her 2 Nu negative, completed Femara for 5 years.  Fracture right foot 05/2014.    P:   Reviewed health and wellness pertinent to exam  Pap smear as above  Mammogram is due 09/2015  Counseled on breast self exam, mammography screening, adequate intake of calcium and vitamin D, diet and exercise return annually or prn  An After Visit Summary was printed and given to the patient.

## 2015-05-24 NOTE — Patient Instructions (Addendum)

## 2015-05-26 NOTE — Progress Notes (Signed)
Encounter reviewed by Dr. Evelena Masci Amundson C. Silva.  

## 2015-08-28 ENCOUNTER — Emergency Department (HOSPITAL_BASED_OUTPATIENT_CLINIC_OR_DEPARTMENT_OTHER)
Admission: EM | Admit: 2015-08-28 | Discharge: 2015-08-28 | Disposition: A | Discharge status: Home / Self Care | Payer: Worker's Compensation | Attending: Emergency Medicine | Admitting: Emergency Medicine

## 2015-08-28 ENCOUNTER — Encounter (HOSPITAL_BASED_OUTPATIENT_CLINIC_OR_DEPARTMENT_OTHER): Payer: Self-pay

## 2015-08-28 DIAGNOSIS — I10 Essential (primary) hypertension: Secondary | ICD-10-CM | POA: Diagnosis not present

## 2015-08-28 DIAGNOSIS — Z853 Personal history of malignant neoplasm of breast: Secondary | ICD-10-CM | POA: Diagnosis not present

## 2015-08-28 DIAGNOSIS — Z23 Encounter for immunization: Secondary | ICD-10-CM | POA: Diagnosis not present

## 2015-08-28 DIAGNOSIS — Z79899 Other long term (current) drug therapy: Secondary | ICD-10-CM | POA: Insufficient documentation

## 2015-08-28 DIAGNOSIS — Z7982 Long term (current) use of aspirin: Secondary | ICD-10-CM | POA: Insufficient documentation

## 2015-08-28 DIAGNOSIS — Y998 Other external cause status: Secondary | ICD-10-CM | POA: Diagnosis not present

## 2015-08-28 DIAGNOSIS — Z87891 Personal history of nicotine dependence: Secondary | ICD-10-CM | POA: Insufficient documentation

## 2015-08-28 DIAGNOSIS — E7800 Pure hypercholesterolemia, unspecified: Secondary | ICD-10-CM | POA: Diagnosis not present

## 2015-08-28 DIAGNOSIS — Z8669 Personal history of other diseases of the nervous system and sense organs: Secondary | ICD-10-CM | POA: Diagnosis not present

## 2015-08-28 DIAGNOSIS — S61219A Laceration without foreign body of unspecified finger without damage to nail, initial encounter: Secondary | ICD-10-CM

## 2015-08-28 DIAGNOSIS — Y9289 Other specified places as the place of occurrence of the external cause: Secondary | ICD-10-CM | POA: Insufficient documentation

## 2015-08-28 DIAGNOSIS — Y9389 Activity, other specified: Secondary | ICD-10-CM | POA: Insufficient documentation

## 2015-08-28 DIAGNOSIS — W208XXA Other cause of strike by thrown, projected or falling object, initial encounter: Secondary | ICD-10-CM | POA: Diagnosis not present

## 2015-08-28 DIAGNOSIS — E119 Type 2 diabetes mellitus without complications: Secondary | ICD-10-CM | POA: Insufficient documentation

## 2015-08-28 DIAGNOSIS — S61217A Laceration without foreign body of left little finger without damage to nail, initial encounter: Secondary | ICD-10-CM | POA: Insufficient documentation

## 2015-08-28 MED ORDER — TETANUS-DIPHTH-ACELL PERTUSSIS 5-2.5-18.5 LF-MCG/0.5 IM SUSP
0.5000 mL | Freq: Once | INTRAMUSCULAR | Status: AC
  Administered 2015-08-28: 0.5 mL via INTRAMUSCULAR
  Filled 2015-08-28: qty 0.5

## 2015-08-28 MED ORDER — LIDOCAINE HCL 2 % IJ SOLN
20.0000 mL | Freq: Once | INTRAMUSCULAR | Status: AC
  Administered 2015-08-28: 400 mg
  Filled 2015-08-28: qty 20

## 2015-08-28 NOTE — Discharge Instructions (Signed)
Laceration Care, Adult  A laceration is a cut that goes through all layers of the skin. The cut also goes into the tissue that is right under the skin. Some cuts heal on their own. Others need to be closed with stitches (sutures), staples, skin adhesive strips, or wound glue. Taking care of your cut lowers your risk of infection and helps your cut to heal better.  HOW TO TAKE CARE OF YOUR CUT  For stitches or staples:  · Keep the wound clean and dry.  · If you were given a bandage (dressing), you should change it at least one time per day or as told by your doctor. You should also change it if it gets wet or dirty.  · Keep the wound completely dry for the first 24 hours or as told by your doctor. After that time, you may take a shower or a bath. However, make sure that the wound is not soaked in water until after the stitches or staples have been removed.  · Clean the wound one time each day or as told by your doctor:    Wash the wound with soap and water.    Rinse the wound with water until all of the soap comes off.    Pat the wound dry with a clean towel. Do not rub the wound.  · After you clean the wound, put a thin layer of antibiotic ointment on it as told by your doctor. This ointment:    Helps to prevent infection.    Keeps the bandage from sticking to the wound.  · Have your stitches or staples removed as told by your doctor.  If your doctor used skin adhesive strips:   · Keep the wound clean and dry.  · If you were given a bandage, you should change it at least one time per day or as told by your doctor. You should also change it if it gets dirty or wet.  · Do not get the skin adhesive strips wet. You can take a shower or a bath, but be careful to keep the wound dry.  · If the wound gets wet, pat it dry with a clean towel. Do not rub the wound.  · Skin adhesive strips fall off on their own. You can trim the strips as the wound heals. Do not remove any strips that are still stuck to the wound. They will  fall off after a while.  If your doctor used wound glue:  · Try to keep your wound dry, but you may briefly wet it in the shower or bath. Do not soak the wound in water, such as by swimming.  · After you take a shower or a bath, gently pat the wound dry with a clean towel. Do not rub the wound.  · Do not do any activities that will make you really sweaty until the skin glue has fallen off on its own.  · Do not apply liquid, cream, or ointment medicine to your wound while the skin glue is still on.  · If you were given a bandage, you should change it at least one time per day or as told by your doctor. You should also change it if it gets dirty or wet.  · If a bandage is placed over the wound, do not let the tape for the bandage touch the skin glue.  · Do not pick at the glue. The skin glue usually stays on for 5-10 days. Then, it   falls off of the skin.  General Instructions   · To help prevent scarring, make sure to cover your wound with sunscreen whenever you are outside after stitches are removed, after adhesive strips are removed, or when wound glue stays in place and the wound is healed. Make sure to wear a sunscreen of at least 30 SPF.  · Take over-the-counter and prescription medicines only as told by your doctor.  · If you were given antibiotic medicine or ointment, take or apply it as told by your doctor. Do not stop using the antibiotic even if your wound is getting better.  · Do not scratch or pick at the wound.  · Keep all follow-up visits as told by your doctor. This is important.  · Check your wound every day for signs of infection. Watch for:    Redness, swelling, or pain.    Fluid, blood, or pus.  · Raise (elevate) the injured area above the level of your heart while you are sitting or lying down, if possible.  GET HELP IF:  · You got a tetanus shot and you have any of these problems at the injection site:    Swelling.    Very bad pain.    Redness.    Bleeding.  · You have a fever.  · A wound that was  closed breaks open.  · You notice a bad smell coming from your wound or your bandage.  · You notice something coming out of the wound, such as wood or glass.  · Medicine does not help your pain.  · You have more redness, swelling, or pain at the site of your wound.  · You have fluid, blood, or pus coming from your wound.  · You notice a change in the color of your skin near your wound.  · You need to change the bandage often because fluid, blood, or pus is coming from the wound.  · You start to have a new rash.  · You start to have numbness around the wound.  GET HELP RIGHT AWAY IF:  · You have very bad swelling around the wound.  · Your pain suddenly gets worse and is very bad.  · You notice painful lumps near the wound or on skin that is anywhere on your body.  · You have a red streak going away from your wound.  · The wound is on your hand or foot and you cannot move a finger or toe like you usually can.  · The wound is on your hand or foot and you notice that your fingers or toes look pale or bluish.     This information is not intended to replace advice given to you by your health care provider. Make sure you discuss any questions you have with your health care provider.     Document Released: 04/15/2008 Document Revised: 03/14/2015 Document Reviewed: 10/24/2014  Elsevier Interactive Patient Education ©2016 [employer]

## 2015-08-28 NOTE — ED Notes (Signed)
Box tape roll fell out of cabinet and cut left pinky finger approx 1215p

## 2015-08-28 NOTE — ED Provider Notes (Signed)
CSN: [id number]     Arrival date & time 08/28/15  1240 History   First MD Initiated Contact with Patient 08/28/15 1406     Chief Complaint  Patient presents with  . Finger Injury     (Consider location/radiation/quality/duration/timing/severity/associated sxs/prior Treatment) Patient is a 74 y.o. female presenting with skin laceration. The history is provided by the patient.  Laceration Location:  Finger Finger laceration location:  L little finger Length (cm):  2 cm Depth:  Through dermis Quality: stellate   Bleeding: controlled   Injury mechanism: She was reaching into a Bit and the box tape roller fell on her finger cutting it. Pain details:    Quality:  Aching   Severity:  Mild   Timing:  Constant   Progression:  Unchanged Foreign body present:  No foreign bodies Relieved by:  Nothing Worsened by:  Nothing tried Tetanus status:  Out of date   Past Medical History  Diagnosis Date  . Family history of breast cancer     aunt  . Diabetes mellitus 2000  . Hypercholesteremia 2000  . Hypertension   . Breast CA ([place]) 04/2009    right - radiation and lumpectomy  . Postmenopausal     took HRT 1999 - 2004  . 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 Breast lumpectomy  04/2009    rt breast  estrogen +, Her 2 Nu negative  . Colonoscopy with propofol N/A 04/05/2014    Procedure: COLONOSCOPY WITH PROPOFOL;  Surgeon: Garlan Fair, MD;  Location: [facility];  Service: Endoscopy;  Laterality: N/A;  . Orif foot fracture Right 04/20/2014    done in [place]   Family History  Problem Relation Age of Onset  . Heart disease Mother     Confestive Heart Failure  . Heart failure Mother   . Diabetes Father   . Heart disease Father     Congestive Heart Failure  . Heart failure Father   . Obesity Brother    Social History  Substance Use Topics  . Smoking status: Former Smoker -- 0.25 packs/day for 30 years    Types:  Cigarettes    Start date: 03/30/1962    Quit date: 11/12/1991  . Smokeless tobacco: Never Used     Comment: quit 23 years ago  . Alcohol Use: No   OB History    Gravida Para Term Preterm AB TAB SAB Ectopic Multiple Living   1 1 1  0 0 0 0 0 0 1      Obstetric Comments   Pt has adopted son, 02/1970     Review of Systems  All other systems reviewed and are negative.     Allergies  Review of patient's allergies indicates no known allergies.  Home Medications   Prior to Admission medications   Medication Sig Start Date End Date Taking? Authorizing Provider  aspirin 81 MG tablet Take 81 mg by mouth daily.      Historical Provider, MD  Cholecalciferol (VITAMIN D-3 PO) Take 2,000 Units by mouth.     Historical Provider, MD  Cyanocobalamin (B-12 PO) Take 1,000 mg by mouth daily.     Historical Provider, MD  ezetimibe (ZETIA) 10 MG tablet Take 10 mg by mouth every evening.     Historical Provider, MD  gabapentin (NEURONTIN) 600 MG tablet Take 600 mg by mouth 3 (three) times daily.    Historical Provider, MD  [place] (  75-25) 100 UNIT/ML Kwikpen Inject 35 Units into the skin 2 (two) times daily. 04/24/15   Historical Provider, MD  lisinopril (PRINIVIL,ZESTRIL) 2.5 MG tablet Take 2.5 mg by mouth every morning.     Historical Provider, MD  metFORMIN (GLUCOPHAGE) 1000 MG tablet Take 1,000 mg by mouth 2 (two) times daily with a meal.    Historical Provider, MD  Multiple Vitamin (MULTIVITAMIN WITH MINERALS) TABS tablet Take 0.5 tablets by mouth 2 (two) times daily.    Historical Provider, MD  Omega-3 Fatty Acids (FISH OIL PO) Take 1,200 mg by mouth 2 (two) times daily.     Historical Provider, MD  rosuvastatin (CRESTOR) 40 MG tablet Take 40 mg by mouth daily.      Historical Provider, MD   BP 139/57 mmHg  Pulse 62  Temp(Src) 98.1 F (36.7 C) (Oral)  Resp 18  Ht 5\' 11"  (1.803 m)  Wt 208 lb (94.348 kg)  BMI 29.02 kg/m2  SpO2 99%  LMP 12/12/1997 Physical Exam    Constitutional: She is oriented to person, place, and time. She appears well-developed and well-nourished. No distress.  HENT:  Head: Normocephalic and atraumatic.  Eyes: EOM are normal. Pupils are equal, round, and reactive to light.  Cardiovascular: Normal rate.   Pulmonary/Chest: Effort normal.  Musculoskeletal:       Hands: Stellate laceration to the palmar side of the left fifth digit. Full range of motion at the DIP and PIP joints. No concern for tendon injury. Normal sensation. Wound explored and no evidence of foreign body.  Neurological: She is alert and oriented to person, place, and time.  Skin: Skin is warm and dry.  Psychiatric: She has a normal mood and affect. Her behavior is normal.  Nursing note and vitals reviewed.   ED Course  Procedures (including critical care time) Labs Review Labs Reviewed - No data to display  Imaging Review No results found. I have personally reviewed and evaluated these images and lab results as part of my medical decision-making.   EKG Interpretation None     LACERATION REPAIR Performed by: Blanchie Dessert Authorized byBlanchie Dessert Consent: Verbal consent obtained. Risks and benefits: risks, benefits and alternatives were discussed Consent given by: patient Patient identity confirmed: provided demographic data Prepped and Draped in normal sterile fashion Wound explored  Laceration Location: left fifth digit  Laceration Length: 2 cm  No Foreign Bodies seen or palpated  Anesthesia: Digital block   Local anesthetic: lidocaine 2% without epinephrine  Anesthetic total: 3 ml  Irrigation method: syringe Amount of cleaning: standard  Skin closure: 4. 0 Vicryl   Number of sutures: 6  Technique: Simple interrupted   Patient tolerance: Patient tolerated the procedure well with no immediate complications.  MDM   Final diagnoses:  Finger laceration, initial encounter    Patient with a simple laceration to the  left fifth digit. No tendon involvement. Tetanus shot updated today. Wound repaired as above.    Blanchie Dessert, MD 08/28/15 1454

## 2015-09-08 ENCOUNTER — Emergency Department (HOSPITAL_BASED_OUTPATIENT_CLINIC_OR_DEPARTMENT_OTHER)
Admission: EM | Admit: 2015-09-08 | Discharge: 2015-09-08 | Disposition: A | Discharge status: Home / Self Care | Payer: Worker's Compensation | Attending: Emergency Medicine | Admitting: Emergency Medicine

## 2015-09-08 ENCOUNTER — Encounter (HOSPITAL_BASED_OUTPATIENT_CLINIC_OR_DEPARTMENT_OTHER): Payer: Self-pay

## 2015-09-08 DIAGNOSIS — E119 Type 2 diabetes mellitus without complications: Secondary | ICD-10-CM | POA: Insufficient documentation

## 2015-09-08 DIAGNOSIS — Z4802 Encounter for removal of sutures: Secondary | ICD-10-CM | POA: Insufficient documentation

## 2015-09-08 DIAGNOSIS — Z87891 Personal history of nicotine dependence: Secondary | ICD-10-CM | POA: Insufficient documentation

## 2015-09-08 DIAGNOSIS — Z853 Personal history of malignant neoplasm of breast: Secondary | ICD-10-CM | POA: Insufficient documentation

## 2015-09-08 DIAGNOSIS — Z79899 Other long term (current) drug therapy: Secondary | ICD-10-CM | POA: Diagnosis not present

## 2015-09-08 DIAGNOSIS — E7800 Pure hypercholesterolemia, unspecified: Secondary | ICD-10-CM | POA: Insufficient documentation

## 2015-09-08 DIAGNOSIS — Z8669 Personal history of other diseases of the nervous system and sense organs: Secondary | ICD-10-CM | POA: Diagnosis not present

## 2015-09-08 DIAGNOSIS — Z7982 Long term (current) use of aspirin: Secondary | ICD-10-CM | POA: Diagnosis not present

## 2015-09-08 DIAGNOSIS — I10 Essential (primary) hypertension: Secondary | ICD-10-CM | POA: Diagnosis not present

## 2015-09-08 NOTE — ED Provider Notes (Signed)
Medical screening examination/treatment/procedure(s) were conducted as a shared visit with non-physician practitioner(s) and myself.  I personally evaluated the patient during the encounter.   EKG Interpretation None       Patient seen by me. Patient status post laceration to the finger left little finger 11 days ago. Wounds healed well here for suture removal. No signs of any infection or complicating factors good movement of the finger. Sutures removed and patient is stable for discharge home.  Fredia Sorrow, MD 09/08/15 1435

## 2015-09-08 NOTE — ED Notes (Signed)
Pa  at bedside. 

## 2015-09-08 NOTE — ED Provider Notes (Signed)
CSN: [id number]     Arrival date & time 09/08/15  1252 History   First MD Initiated Contact with Patient 09/08/15 1356     Chief Complaint  Patient presents with  . Suture / Staple Removal     (Consider location/radiation/quality/duration/timing/severity/associated sxs/prior Treatment) HPI  Vickie Burns is a 74 y.o. female with PMH significant for DM, HTN, hypercholesteremia who presents for suture removal.  Patient sustained a laceration 08/28/15, 2 cm in length, left little finger, closed with 6 vicryl sutures.  Patient denies pain at suture site.  Patient denies fevers, chills, N/V, CP, SOB, or abdominal pain.   Past Medical History  Diagnosis Date  . Family history of breast cancer     aunt  . Diabetes mellitus 2000  . Hypercholesteremia 2000  . Hypertension   . Breast CA ([place]) 04/2009    right - radiation and lumpectomy  . Postmenopausal     took HRT 1999 - 2004  . 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 Breast lumpectomy  04/2009    rt breast  estrogen +, Her 2 Nu negative  . Colonoscopy with propofol N/A 04/05/2014    Procedure: COLONOSCOPY WITH PROPOFOL;  Surgeon: Garlan Fair, MD;  Location: [facility];  Service: Endoscopy;  Laterality: N/A;  . Orif foot fracture Right 04/20/2014    done in [place]   Family History  Problem Relation Age of Onset  . Heart disease Mother     Confestive Heart Failure  . Heart failure Mother   . Diabetes Father   . Heart disease Father     Congestive Heart Failure  . Heart failure Father   . Obesity Brother    Social History  Substance Use Topics  . Smoking status: Former Smoker -- 0.25 packs/day for 30 years    Types: Cigarettes    Start date: 03/30/1962    Quit date: 11/12/1991  . Smokeless tobacco: Never Used     Comment: quit 23 years ago  . Alcohol Use: No   OB History    Gravida Para Term Preterm AB TAB SAB Ectopic Multiple Living   1 1 1  0 0 0 0 0 0  1      Obstetric Comments   Pt has adopted son, 02/1970     Review of Systems  All other systems negative unless otherwise stated in HPI   Allergies  Review of patient's allergies indicates no known allergies.  Home Medications   Prior to Admission medications   Medication Sig Start Date End Date Taking? Authorizing Provider  aspirin 81 MG tablet Take 81 mg by mouth daily.      Historical Provider, MD  Cholecalciferol (VITAMIN D-3 PO) Take 2,000 Units by mouth.     Historical Provider, MD  Cyanocobalamin (B-12 PO) Take 1,000 mg by mouth daily.     Historical Provider, MD  ezetimibe (ZETIA) 10 MG tablet Take 10 mg by mouth every evening.     Historical Provider, MD  gabapentin (NEURONTIN) 600 MG tablet Take 600 mg by mouth 3 (three) times daily.    Historical Provider, MD  HUMALOG MIX 75/25 KWIKPEN (75-25) 100 UNIT/ML Kwikpen Inject 35 Units into the skin 2 (two) times daily. 04/24/15   Historical Provider, MD  lisinopril (PRINIVIL,ZESTRIL) 2.5 MG tablet Take 2.5 mg by mouth every morning.     Historical Provider, MD  metFORMIN (GLUCOPHAGE) 1000 MG tablet Take 1,000 mg by  mouth 2 (two) times daily with a meal.    Historical Provider, MD  Multiple Vitamin (MULTIVITAMIN WITH MINERALS) TABS tablet Take 0.5 tablets by mouth 2 (two) times daily.    Historical Provider, MD  Omega-3 Fatty Acids (FISH OIL PO) Take 1,200 mg by mouth 2 (two) times daily.     Historical Provider, MD  rosuvastatin (CRESTOR) 40 MG tablet Take 40 mg by mouth daily.      Historical Provider, MD   BP 128/75 mmHg  Pulse 67  Temp(Src) 98.7 F (37.1 C) (Oral)  Resp 18  Ht 5\' 11"  (1.803 m)  Wt 208 lb (94.348 kg)  BMI 29.02 kg/m2  SpO2 100%  LMP 12/12/1997 Physical Exam  Constitutional: She is oriented to person, place, and time. She appears well-developed and well-nourished.  HENT:  Head: Atraumatic.  Eyes: Conjunctivae are normal. No scleral icterus.  Neck: No tracheal deviation present.  Cardiovascular:   Capillary refill less than 3 seconds distal to injury.   Pulmonary/Chest: Effort normal. No respiratory distress.  Neurological: She is alert and oriented to person, place, and time.  Sensation intact distal to previous injury.   Skin: Skin is warm and dry.  Well healing scar on left little finger.  No surrounding erythema, induration, or warmth.  No drainage.  Psychiatric: She has a normal mood and affect. Her behavior is normal.    ED Course  Procedures (including critical care time) Labs Review Labs Reviewed - No data to display  Imaging Review No results found. I have personally reviewed and evaluated these images and lab results as part of my medical decision-making.   EKG Interpretation None      MDM   Final diagnoses:  Visit for suture removal    Patient presents for suture removal.  VSS, NAD.  Patient denies fevers.  No other complaints.  Wound is well healing, no signs of infection. Discussed return precautions.  Patient agrees and acknowledges the above plan for discharge.      Gloriann Loan, PA-C 09/08/15 1449  Fredia Sorrow, MD 09/13/15 1553

## 2015-09-08 NOTE — Discharge Instructions (Signed)

## 2015-09-08 NOTE — ED Notes (Signed)
Suture removal to left 5th finger-placed 10/17

## 2015-09-27 ENCOUNTER — Ambulatory Visit: Payer: Medicare Other | Admitting: Hematology & Oncology

## 2015-09-27 ENCOUNTER — Other Ambulatory Visit: Payer: Medicare Other

## 2015-09-27 ENCOUNTER — Ambulatory Visit: Payer: Medicare Other

## 2015-09-29 ENCOUNTER — Encounter: Payer: Self-pay | Admitting: Hematology & Oncology

## 2015-09-29 ENCOUNTER — Ambulatory Visit (HOSPITAL_BASED_OUTPATIENT_CLINIC_OR_DEPARTMENT_OTHER): Payer: Medicare Other

## 2015-09-29 ENCOUNTER — Ambulatory Visit (HOSPITAL_BASED_OUTPATIENT_CLINIC_OR_DEPARTMENT_OTHER): Payer: Medicare Other | Admitting: Hematology & Oncology

## 2015-09-29 ENCOUNTER — Other Ambulatory Visit (HOSPITAL_BASED_OUTPATIENT_CLINIC_OR_DEPARTMENT_OTHER): Payer: Medicare Other

## 2015-09-29 VITALS — BP 132/64 | HR 52 | Temp 97.5°F | Resp 16 | Ht 71.0 in | Wt 211.0 lb

## 2015-09-29 DIAGNOSIS — C50912 Malignant neoplasm of unspecified site of left female breast: Secondary | ICD-10-CM

## 2015-09-29 DIAGNOSIS — C50011 Malignant neoplasm of nipple and areola, right female breast: Secondary | ICD-10-CM | POA: Diagnosis not present

## 2015-09-29 DIAGNOSIS — C50012 Malignant neoplasm of nipple and areola, left female breast: Secondary | ICD-10-CM

## 2015-09-29 LAB — CBC WITH DIFFERENTIAL (CANCER CENTER ONLY)
BASO#: 0 10*3/uL (ref 0.0–0.2)
BASO%: 0.5 % (ref 0.0–2.0)
EOS%: 3.6 % (ref 0.0–7.0)
Eosinophils Absolute: 0.3 10*3/uL (ref 0.0–0.5)
HEMATOCRIT: 37.8 % (ref 34.8–46.6)
HEMOGLOBIN: 12.3 g/dL (ref 11.6–15.9)
LYMPH#: 1.5 10*3/uL (ref 0.9–3.3)
LYMPH%: 19 % (ref 14.0–48.0)
MCH: 29.4 pg (ref 26.0–34.0)
MCHC: 32.5 g/dL (ref 32.0–36.0)
MCV: 90 fL (ref 81–101)
MONO#: 0.6 10*3/uL (ref 0.1–0.9)
MONO%: 7.7 % (ref 0.0–13.0)
NEUT%: 69.2 % (ref 39.6–80.0)
NEUTROS ABS: 5.3 10*3/uL (ref 1.5–6.5)
Platelets: 219 10*3/uL (ref 145–400)
RBC: 4.18 10*6/uL (ref 3.70–5.32)
RDW: 14.8 % (ref 11.1–15.7)
WBC: 7.7 10*3/uL (ref 3.9–10.0)

## 2015-09-29 LAB — COMPREHENSIVE METABOLIC PANEL (CC13)
ALBUMIN: 3.5 g/dL (ref 3.5–5.0)
ALK PHOS: 51 U/L (ref 40–150)
ALT: 15 U/L (ref 0–55)
ANION GAP: 6 meq/L (ref 3–11)
AST: 18 U/L (ref 5–34)
BILIRUBIN TOTAL: 0.37 mg/dL (ref 0.20–1.20)
BUN: 17.4 mg/dL (ref 7.0–26.0)
CALCIUM: 9.5 mg/dL (ref 8.4–10.4)
CHLORIDE: 104 meq/L (ref 98–109)
CO2: 27 mEq/L (ref 22–29)
CREATININE: 1 mg/dL (ref 0.6–1.1)
EGFR: 57 mL/min/{1.73_m2} — ABNORMAL LOW (ref >90)
Glucose: 237 mg/dl — ABNORMAL HIGH (ref 70–140)
Potassium: 4.5 mEq/L (ref 3.5–5.1)
Sodium: 138 mEq/L (ref 136–145)
TOTAL PROTEIN: 6 g/dL — AB (ref 6.4–8.3)

## 2015-09-29 MED ORDER — SODIUM CHLORIDE 0.9 % IV SOLN
Freq: Once | INTRAVENOUS | Status: AC
  Administered 2015-09-29: 15:00:00 via INTRAVENOUS

## 2015-09-29 MED ORDER — ZOLEDRONIC ACID 4 MG/100ML IV SOLN
4.0000 mg | Freq: Once | INTRAVENOUS | Status: AC
  Administered 2015-09-29: 4 mg via INTRAVENOUS
  Filled 2015-09-29: qty 100

## 2015-09-29 NOTE — Patient Instructions (Signed)

## 2015-09-29 NOTE — Progress Notes (Signed)
Hematology and Oncology Follow Up Visit  Vickie Burns [id number] Jun 11, 1941 74 y.o. 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  Current Therapy:    Patient completed 5 years of Femara in October 2015  Zometa 4 mg IV every year     Interim History:  Ms.  Burns is back for follow-up. She is doing okay. 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 She's had surgery in the right ankle. This ankle is doing okay. She is going to have a special boot made for her left leg to try to help her ambulate.  She is still working. She works for the [employer] She is very busy this past elections cycle.  She's had no problems with nausea or vomiting. She's had no change in bowel or bladder habits. She's had no cough or shortness of breath. She's had no rashes.  Her last mammogram was done a year ago. I think she is due for another one.  Overall, her performance status is ECOG 1  Medications:  Current outpatient prescriptions:  .  aspirin 81 MG tablet, Take 81 mg by mouth daily.  , Disp: , Rfl:  .  Cholecalciferol (VITAMIN D-3 PO), Take 2,000 Units by mouth. , Disp: , Rfl:  .  Cyanocobalamin (B-12 PO), Take 1,000 mg by mouth daily. , Disp: , Rfl:  .  ezetimibe (ZETIA) 10 MG tablet, Take 10 mg by mouth every evening. , Disp: , Rfl:  .  gabapentin (NEURONTIN) 600 MG tablet, Take 600 mg by mouth 3 (three) times daily., Disp: , Rfl:  .  HUMALOG MIX 75/25 KWIKPEN (75-25) 100 UNIT/ML Kwikpen, Inject 35 Units into the skin 2 (two) times daily., Disp: , Rfl:  .  lisinopril (PRINIVIL,ZESTRIL) 2.5 MG tablet, Take 2.5 mg by mouth every morning. , Disp: , Rfl:  .  metFORMIN (GLUCOPHAGE) 1000 MG tablet, Take 1,000 mg by mouth 2 (two) times daily with a meal., Disp: , Rfl:  .  Multiple Vitamin (MULTIVITAMIN WITH MINERALS) TABS tablet, Take 0.5 tablets by mouth 2 (two) times daily., Disp: , Rfl:  .  Omega-3 Fatty  Acids (FISH OIL PO), Take 1,200 mg by mouth 2 (two) times daily. , Disp: , Rfl:  .  rosuvastatin (CRESTOR) 40 MG tablet, Take 40 mg by mouth daily.  , Disp: , Rfl:   Allergies: No Known Allergies  Past Medical History, Surgical history, Social history, and Family History were reviewed and updated.  Review of Systems: As above  Physical Exam:  height is 5\' 11"  (1.803 m) and weight is 211 lb (95.709 kg). Her oral temperature is 97.5 F (36.4 C). Her blood pressure is 132/64 and her pulse is 52. Her respiration is 16.   Well-developed and well-nourished white female in no obvious Distress. Head and neck exam shows no ocular or oral lesions. She has a palpable cervical or supraclavicular lymph nodes. Lungs are clear. Cardiac exam regular rate and rhythm with no murmurs, rubs or bruits. Abdomen is soft. Has good bowel sounds. There is no fluid wave. There is some scattered ecchymoses from the insulin injections. There is no palpable liver or spleen tip. Breast exam shows left breast with no masses, edema or erythema. There is no left axillary adenopathy. Right breast shows a well-healed lumpectomy at the 10:00 position. No masses, edema or erythema is noted in the right breast. There is no right axillary adenopathy. Extremities shows  no clubbing, cyanosis or edema. Her right foot is still a little tender to palpation. Neurological exam is nonfocal.  Lab Results  Component Value Date   WBC 7.7 09/29/2015   HGB 12.3 09/29/2015   HCT 37.8 09/29/2015   MCV 90 09/29/2015   PLT 219 09/29/2015     Chemistry      Component Value Date/Time   NA 144 09/28/2014 1011   NA 134* 09/27/2013 1001   K 4.0 09/28/2014 1011   K 4.4 09/27/2013 1001   CL 98 09/28/2014 1011   CL 103 09/27/2013 1001   CO2 27 09/28/2014 1011   CO2 25 09/27/2013 1001   BUN 14 09/28/2014 1011   BUN 13 09/27/2013 1001   CREATININE 1.0 09/28/2014 1011   CREATININE 0.80 09/27/2013 1001      Component Value Date/Time   CALCIUM  9.7 09/28/2014 1011   CALCIUM 9.9 09/27/2013 1001   ALKPHOS 46 09/28/2014 1011   ALKPHOS 43 09/27/2013 1001   AST 17 09/28/2014 1011   AST 23 09/27/2013 1001   ALT 26 09/28/2014 1011   ALT 19 09/27/2013 1001   BILITOT 0.60 09/28/2014 1011   BILITOT 0.5 09/27/2013 1001         Impression and Plan: Vickie Burns is 74 year old white female. 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 She has done very well. She completed 5 years of Femara in October 2015  I this point, I think that we can probably do the Zometa once a year.  I think we get her back once a year.   Volanda Napoleon, MD 11/18/20163:12 PM

## 2015-10-19 ENCOUNTER — Other Ambulatory Visit: Payer: Self-pay | Admitting: Hematology & Oncology

## 2015-10-19 DIAGNOSIS — C801 Malignant (primary) neoplasm, unspecified: Secondary | ICD-10-CM

## 2015-10-19 DIAGNOSIS — Z9889 Other specified postprocedural states: Secondary | ICD-10-CM

## 2015-11-27 ENCOUNTER — Ambulatory Visit
Admission: RE | Admit: 2015-11-27 | Discharge: 2015-11-27 | Disposition: A | Discharge status: Home / Self Care | Payer: Medicare Other | Source: Ambulatory Visit | Attending: Hematology & Oncology | Admitting: Hematology & Oncology

## 2015-11-27 DIAGNOSIS — Z9889 Other specified postprocedural states: Secondary | ICD-10-CM

## 2015-11-27 DIAGNOSIS — C801 Malignant (primary) neoplasm, unspecified: Secondary | ICD-10-CM

## 2016-05-28 ENCOUNTER — Ambulatory Visit: Payer: Medicare Other | Admitting: Nurse Practitioner

## 2016-06-19 ENCOUNTER — Encounter: Payer: Self-pay | Admitting: Nurse Practitioner

## 2016-06-19 ENCOUNTER — Ambulatory Visit (INDEPENDENT_AMBULATORY_CARE_PROVIDER_SITE_OTHER): Payer: Medicare Other | Admitting: Nurse Practitioner

## 2016-06-19 VITALS — BP 124/80 | HR 60 | Ht 69.0 in | Wt 220.0 lb

## 2016-06-19 DIAGNOSIS — Z01419 Encounter for gynecological examination (general) (routine) without abnormal findings: Secondary | ICD-10-CM

## 2016-06-19 DIAGNOSIS — C50411 Malignant neoplasm of upper-outer quadrant of right female breast: Secondary | ICD-10-CM

## 2016-06-19 DIAGNOSIS — I10 Essential (primary) hypertension: Secondary | ICD-10-CM

## 2016-06-19 NOTE — Progress Notes (Signed)
Patient ID: Vickie Burns, female   DOB: 1940/11/15, 75 y.o.   MRN: [id number]  75 y.o. G1P1001 Widowed  Caucasian Fe here for annual exam.  Last HGC AIC was 7.9.  She has no other health problems.  Broke her right foot 2 years ago in a traumatic fall and has recovered nicely.  She denies any urinary symptoms.  Not dating or SA.  Patient's last menstrual period was 12/12/1997.          Sexually active: No.  The current method of family planning is abstinence.    Exercising: Yes.    walking when able Smoker:  no  Health Maintenance: Pap:  03/23/13, Negative MMG:  11/27/15, 3D, Bi-Rads 2:  Benign Colonoscopy:  04/05/14, adenoma, repeat in  years BMD: 09/20/14 T-Score: 1.4 Spine / 0.3 Left Femur Total TDaP: 08/28/15-laceration Shingles: 2009 Pneumonia: Pneumovax 07/12/10, Prevnar 13, 08/20/14 Hep C and HIV: Not indicated due to age Labs: Dr. Felipa Eth and Endocrinology   reports that she quit smoking about 24 years ago. Her smoking use included Cigarettes. She started smoking about 54 years ago. She has a 7.50 pack-year smoking history. She has never used smokeless tobacco. She reports that she does not drink alcohol or use drugs.  Past Medical History:  Diagnosis Date  . Breast CA ([place]) 04/2009   right - radiation and lumpectomy  . Diabetes mellitus 2000  . Family history of breast cancer    aunt  . Hypercholesteremia 2000  . Hypertension   . 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 Postmenopausal    took HRT 1999 - 2004    Past Surgical History:  Procedure Laterality Date  . BREAST LUMPECTOMY  04/2009   rt breast  estrogen +, Her 2 Nu negative  . COLONOSCOPY WITH PROPOFOL N/A 04/05/2014   Procedure: COLONOSCOPY WITH PROPOFOL;  Surgeon: Garlan Fair, MD;  Location: [facility];  Service: Endoscopy;  Laterality: N/A;  . ORIF FOOT FRACTURE Right 04/20/2014   done in [place]    Current Outpatient Prescriptions  Medication Sig Dispense Refill  . aspirin 81 MG tablet Take  81 mg by mouth daily.      . brimonidine (ALPHAGAN) 0.2 % ophthalmic solution Place 1 drop into the right eye 2 (two) times daily.    . Bromfenac Sodium (PROLENSA) 0.07 % SOLN Place 1 drop into the right eye 2 (two) times daily.    . Cholecalciferol (VITAMIN D-3 PO) Take 2,000 Units by mouth.     . Cyanocobalamin (B-12 PO) Take 1,000 mg by mouth daily.     . dorzolamide-timolol (COSOPT) 22.3-6.8 MG/ML ophthalmic solution Place 1 drop into the right eye 3 (three) times daily.    Marland Kitchen ezetimibe (ZETIA) 10 MG tablet Take 10 mg by mouth every evening.     . gabapentin (NEURONTIN) 600 MG tablet Take 600 mg by mouth 3 (three) times daily.    Marland Kitchen HUMALOG MIX 75/25 KWIKPEN (75-25) 100 UNIT/ML Kwikpen Inject 35 Units into the skin 2 (two) times daily.    Marland Kitchen lisinopril (PRINIVIL,ZESTRIL) 2.5 MG tablet Take 2.5 mg by mouth every morning.     Marland Kitchen LOTEMAX 0.5 % GEL Place 1 drop into the right eye 2 (two) times daily.    . metFORMIN (GLUCOPHAGE) 1000 MG tablet Take 1,000 mg by mouth 2 (two) times daily with a meal.    . Multiple Vitamin (MULTIVITAMIN WITH MINERALS) TABS tablet Take 0.5 tablets by mouth 2 (two) times daily.    Marland Kitchen  Multiple Vitamins-Minerals (PRESERVISION AREDS 2) CAPS Take 2 capsules by mouth daily.    . Omega-3 Fatty Acids (FISH OIL PO) Take 1,200 mg by mouth 2 (two) times daily.     . rosuvastatin (CRESTOR) 40 MG tablet Take 40 mg by mouth daily.       No current facility-administered medications for this visit.     Family History  Problem Relation Age of Onset  . Heart disease Mother     Confestive Heart Failure  . Heart failure Mother   . Diabetes Father   . Heart disease Father     Congestive Heart Failure  . Heart failure Father   . Obesity Brother     ROS:  Pertinent items are noted in HPI.  Otherwise, a comprehensive ROS was negative.  Exam:   BP 124/80 (BP Location: Left Arm, Patient Position: Sitting, Cuff Size: Large)   Pulse 60   Ht 5\' 9"  (1.753 m)   Wt 220 lb (99.8 kg)  Comment: with shoes and inserts  LMP 12/12/1997   BMI 32.49 kg/m  Height: 5\' 9"  (175.3 cm) Ht Readings from Last 3 Encounters:  06/19/16 5\' 9"  (1.753 m)  09/29/15 5\' 11"  (1.803 m)  09/08/15 5\' 11"  (1.803 m)    General appearance: alert, cooperative and appears stated age Head: Normocephalic, without obvious abnormality, atraumatic Neck: no adenopathy, supple, symmetrical, trachea midline and thyroid normal to inspection and palpation Lungs: clear to auscultation bilaterally Breasts: normal appearance, no masses or tenderness, on the left.  Surgical and radiation changes on the right. Heart: regular rate and rhythm Abdomen: soft, non-tender; no masses,  no organomegaly Extremities: extremities normal, atraumatic, no cyanosis or edema Skin: Skin color, texture, turgor normal. No rashes or lesions, mole on right hand Lymph nodes: Cervical, supraclavicular, and axillary nodes normal. No abnormal inguinal nodes palpated Neurologic: Grossly normal   Pelvic: External genitalia:  no lesions but there is a small remnant of prominent tissue at the left lower labia where the labial folds have fused.  Pt is also seen with Dr. Quincy Simmonds              Urethra:  normal appearing urethra with no masses, tenderness or lesions              Bartholin's and Skene's: normal                 Vagina: normal appearing vagina with normal color and discharge, no lesions              Cervix: anteverted              Pap taken: No. Bimanual Exam:  Uterus:  normal size, contour, position, consistency, mobility, non-tender              Adnexa: no mass, fullness, tenderness               Rectovaginal: Confirms               Anus:  normal sphincter tone, no lesions  Chaperone present: yes  A:  Well Woman with normal exam  Postmenopausal - HRT 1999- 2004 S/P right breast cancer with lumpectomy 2010, Her 2 Nu negative, completed Femara for 5 years.               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

## 2016-06-19 NOTE — Patient Instructions (Addendum)

## 2016-06-20 NOTE — Progress Notes (Signed)
Patient ID: Vickie Burns, female   DOB: [birth year], 75 y.o.   MRN: [id number]  Encounter reviewed by Dr. Aundria Rud. Patient examined by me regarding her labial anatomy.

## 2016-09-06 ENCOUNTER — Other Ambulatory Visit: Payer: Self-pay | Admitting: Family

## 2016-09-06 DIAGNOSIS — M818 Other osteoporosis without current pathological fracture: Secondary | ICD-10-CM

## 2016-09-06 DIAGNOSIS — M810 Age-related osteoporosis without current pathological fracture: Secondary | ICD-10-CM | POA: Insufficient documentation

## 2016-09-06 HISTORY — DX: Age-related osteoporosis without current pathological fracture: M81.0

## 2016-09-20 ENCOUNTER — Other Ambulatory Visit: Payer: Self-pay

## 2016-09-20 DIAGNOSIS — M8000XD Age-related osteoporosis with current pathological fracture, unspecified site, subsequent encounter for fracture with routine healing: Secondary | ICD-10-CM

## 2016-09-27 ENCOUNTER — Ambulatory Visit (HOSPITAL_BASED_OUTPATIENT_CLINIC_OR_DEPARTMENT_OTHER): Payer: Medicare Other

## 2016-09-27 ENCOUNTER — Ambulatory Visit (HOSPITAL_BASED_OUTPATIENT_CLINIC_OR_DEPARTMENT_OTHER): Payer: Medicare Other | Admitting: Family

## 2016-09-27 ENCOUNTER — Other Ambulatory Visit (HOSPITAL_BASED_OUTPATIENT_CLINIC_OR_DEPARTMENT_OTHER): Payer: Medicare Other

## 2016-09-27 VITALS — BP 120/59 | HR 67 | Temp 97.5°F | Resp 20 | Wt 214.8 lb

## 2016-09-27 DIAGNOSIS — Z794 Long term (current) use of insulin: Secondary | ICD-10-CM

## 2016-09-27 DIAGNOSIS — C50011 Malignant neoplasm of nipple and areola, right female breast: Secondary | ICD-10-CM

## 2016-09-27 DIAGNOSIS — C50911 Malignant neoplasm of unspecified site of right female breast: Secondary | ICD-10-CM | POA: Diagnosis not present

## 2016-09-27 DIAGNOSIS — Z170 Estrogen receptor positive status [ER+]: Secondary | ICD-10-CM | POA: Diagnosis not present

## 2016-09-27 DIAGNOSIS — E1165 Type 2 diabetes mellitus with hyperglycemia: Secondary | ICD-10-CM | POA: Diagnosis not present

## 2016-09-27 DIAGNOSIS — M8000XD Age-related osteoporosis with current pathological fracture, unspecified site, subsequent encounter for fracture with routine healing: Secondary | ICD-10-CM

## 2016-09-27 DIAGNOSIS — E559 Vitamin D deficiency, unspecified: Secondary | ICD-10-CM

## 2016-09-27 DIAGNOSIS — C50912 Malignant neoplasm of unspecified site of left female breast: Secondary | ICD-10-CM

## 2016-09-27 DIAGNOSIS — M818 Other osteoporosis without current pathological fracture: Secondary | ICD-10-CM

## 2016-09-27 DIAGNOSIS — C50012 Malignant neoplasm of nipple and areola, left female breast: Principal | ICD-10-CM

## 2016-09-27 LAB — CBC WITH DIFFERENTIAL (CANCER CENTER ONLY)
BASO#: 0 10*3/uL (ref 0.0–0.2)
BASO%: 0.5 % (ref 0.0–2.0)
EOS%: 3.6 % (ref 0.0–7.0)
Eosinophils Absolute: 0.3 10*3/uL (ref 0.0–0.5)
HEMATOCRIT: 38.1 % (ref 34.8–46.6)
HEMOGLOBIN: 12.6 g/dL (ref 11.6–15.9)
LYMPH#: 1.4 10*3/uL (ref 0.9–3.3)
LYMPH%: 17.7 % (ref 14.0–48.0)
MCH: 29.9 pg (ref 26.0–34.0)
MCHC: 33.1 g/dL (ref 32.0–36.0)
MCV: 91 fL (ref 81–101)
MONO#: 0.8 10*3/uL (ref 0.1–0.9)
MONO%: 9.8 % (ref 0.0–13.0)
NEUT#: 5.4 10*3/uL (ref 1.5–6.5)
NEUT%: 68.4 % (ref 39.6–80.0)
Platelets: 261 10*3/uL (ref 145–400)
RBC: 4.21 10*6/uL (ref 3.70–5.32)
RDW: 14 % (ref 11.1–15.7)
WBC: 7.8 10*3/uL (ref 3.9–10.0)

## 2016-09-27 LAB — CMP (CANCER CENTER ONLY)
ALBUMIN: 3.6 g/dL (ref 3.3–5.5)
ALK PHOS: 56 U/L (ref 26–84)
ALT: 17 U/L (ref 10–47)
AST: 22 U/L (ref 11–38)
BUN, Bld: 19 mg/dL (ref 7–22)
CALCIUM: 10 mg/dL (ref 8.0–10.3)
CO2: 27 mEq/L (ref 18–33)
Chloride: 99 mEq/L (ref 98–108)
Creat: 1.2 mg/dl (ref 0.6–1.2)
Glucose, Bld: 471 mg/dL — ABNORMAL HIGH (ref 73–118)
POTASSIUM: 4.7 meq/L (ref 3.3–4.7)
Sodium: 139 mEq/L (ref 128–145)
TOTAL PROTEIN: 6.8 g/dL (ref 6.4–8.1)
Total Bilirubin: 0.8 mg/dl (ref 0.20–1.60)

## 2016-09-27 MED ORDER — ZOLEDRONIC ACID 4 MG/100ML IV SOLN
4.0000 mg | Freq: Once | INTRAVENOUS | Status: AC
  Administered 2016-09-27: 4 mg via INTRAVENOUS
  Filled 2016-09-27: qty 100

## 2016-09-27 MED ORDER — SODIUM CHLORIDE 0.9 % IV SOLN
Freq: Once | INTRAVENOUS | Status: AC
  Administered 2016-09-27: 14:00:00 via INTRAVENOUS

## 2016-09-27 MED ORDER — INSULIN REGULAR HUMAN 100 UNIT/ML IJ SOLN
20.0000 [IU] | Freq: Once | INTRAMUSCULAR | Status: AC
  Administered 2016-09-27: 20 [IU] via SUBCUTANEOUS

## 2016-09-27 NOTE — Patient Instructions (Signed)

## 2016-09-27 NOTE — Progress Notes (Signed)
Follow up after Insulin, her BS is now 485 mg/dl per her home glucometer that nurse measured. Clarise Cruz NP aware.  PT states she feels fine and wants to leave at this time.

## 2016-09-27 NOTE — Progress Notes (Signed)
Hematology and Oncology Follow Up Visit  Vickie [id number] [birth year] 75 y.o. 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  Current Therapy:   Patient completed 5 years of Femara in October 2015 Zometa 4 mg IV every year    Interim History:  Vickie Burns is here today for her annual follow-up. She had a hypoglycemic episodes in her kitchen and blacked out. She ended up fracturing the top of her foot and is currently wearing a boot. She has had no other falls or syncopal episodes.  Blood sugars are poorly controlled on insulin. Her level today is 471. She went to a birthday party this morning and ate lots of cookies, fruit and potato rolls. She has a follow-up with her endocrinologist later this month to have her insulin regimen adjusted.  Her breast exam today was negative. She will be due again for a mammogram in January.  No issue with infections. No fever, chills, n/v, cough, rash, dizziness, SOB, chest pain, palpitations, abdominal pain or changes in bowel or bladder habits.  The neuropathy in her feet is unchanged.  She has maintained a good appetite and is staying well hydrated. Her weight is stable.   Medications:    Medication List       Accurate as of 09/27/16  1:30 PM. Always use your most recent med list.          aspirin 81 MG tablet Take 81 mg by mouth daily.   B-12 PO Take 1,000 mg by mouth daily.   brimonidine 0.2 % ophthalmic solution Commonly known as:  ALPHAGAN Place 1 drop into the right eye 2 (two) times daily.   dorzolamide-timolol 22.3-6.8 MG/ML ophthalmic solution Commonly known as:  COSOPT Place 1 drop into the right eye 3 (three) times daily.   FISH OIL PO Take 1,200 mg by mouth 2 (two) times daily.   gabapentin 600 MG tablet Commonly known as:  NEURONTIN Take 600 mg by mouth 3 (three) times daily.   HUMALOG MIX 75/25 KWIKPEN (75-25) 100 UNIT/ML Kwikpen Generic drug:   Insulin Lispro Prot & Lispro Inject 30 Units into the skin 2 (two) times daily.   lisinopril 2.5 MG tablet Commonly known as:  PRINIVIL,ZESTRIL Take 2.5 mg by mouth every morning.   LOTEMAX 0.5 % Gel Generic drug:  Loteprednol Etabonate Place 1 drop into the right eye 2 (two) times daily.   metFORMIN 1000 MG tablet Commonly known as:  GLUCOPHAGE Take 1,000 mg by mouth 2 (two) times daily with a meal.   multivitamin with minerals Tabs tablet Take 0.5 tablets by mouth 2 (two) times daily.   PRESERVISION AREDS 2 Caps Take 2 capsules by mouth daily.   PROLENSA 0.07 % Soln Generic drug:  Bromfenac Sodium Place 1 drop into the right eye 2 (two) times daily.   rosuvastatin 40 MG tablet Commonly known as:  CRESTOR Take 40 mg by mouth daily.   VITAMIN D-3 PO Take 2,000 Units by mouth.   ZETIA 10 MG tablet Generic drug:  ezetimibe Take 10 mg by mouth every evening.       Allergies: No Known Allergies  Past Medical History, Surgical history, Social history, and Family History were reviewed and updated.  Review of Systems: All other 10 point review of systems is negative.   Physical Exam:  weight is 214 lb 12.8 oz (97.4 kg). Her oral temperature is 97.5 F (36.4 C). Her blood pressure  is 120/59 (abnormal) and her pulse is 67. Her respiration is 20.   Wt Readings from Last 3 Encounters:  09/27/16 214 lb 12.8 oz (97.4 kg)  06/19/16 220 lb (99.8 kg)  09/29/15 211 lb (95.7 kg)    Ocular: Sclerae unicteric, pupils equal, round and reactive to light Ear-nose-throat: Oropharynx clear, dentition fair Lymphatic: No cervical supraclavicular or axillary adenopathy Lungs no rales or rhonchi, good excursion bilaterally Heart regular rate and rhythm, no murmur appreciated Abd soft, nontender, positive bowel sounds, no liver or spleen tip palpated on exam, no fluid wave MSK no focal spinal tenderness, no joint edema Neuro: non-focal, well-oriented, appropriate affect Breasts:  Lumpectomy at the 10 o'clock position of the right breast. No changes with the left breast. No mass, lesion, rash or lymphadenopathy found on exam.   Lab Results  Component Value Date   WBC 7.8 09/27/2016   HGB 12.6 09/27/2016   HCT 38.1 09/27/2016   MCV 91 09/27/2016   PLT 261 09/27/2016   No results found for: FERRITIN, IRON, TIBC, UIBC, IRONPCTSAT Lab Results  Component Value Date   RBC 4.21 09/27/2016   No results found for: KPAFRELGTCHN, LAMBDASER, KAPLAMBRATIO No results found for: IGGSERUM, IGA, IGMSERUM No results found for: Ronnald Ramp, A1GS, A2GS, Tillman Sers, SPEI   Chemistry      Component Value Date/Time   NA 138 09/29/2015 1410   K 4.5 09/29/2015 1410   CL 98 09/28/2014 1011   CO2 27 09/29/2015 1410   BUN 17.4 09/29/2015 1410   CREATININE 1.0 09/29/2015 1410      Component Value Date/Time   CALCIUM 9.5 09/29/2015 1410   ALKPHOS 51 09/29/2015 1410   AST 18 09/29/2015 1410   ALT 15 09/29/2015 1410   BILITOT 0.37 09/29/2015 1410     Impression and Plan: Vickie Burns is 75 yo white female with history of stage I 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 She completed 5 years of Femara in October 2015. So far she has done well and there has been no evidence of recurrence. She is asymptomatic at this time.  She is looking forward to spending thanksgiving with her children and their families next week.  She will be do again for a mammogram in January and will schedule this.  She will get her yearly Zometa today.  We will plan to see her back in a year for repeat lab work , follow-up and infusion.  She will contact us with any questions or concerns. We can certainly see her sooner if need be.   Eliezer Bottom, NP 11/17/20171:30 PM

## 2016-10-07 DIAGNOSIS — I10 Essential (primary) hypertension: Secondary | ICD-10-CM | POA: Insufficient documentation

## 2016-10-07 HISTORY — DX: Essential (primary) hypertension: I10

## 2017-02-06 ENCOUNTER — Other Ambulatory Visit: Payer: Self-pay | Admitting: Geriatric Medicine

## 2017-02-06 DIAGNOSIS — Z1231 Encounter for screening mammogram for malignant neoplasm of breast: Secondary | ICD-10-CM

## 2017-03-04 ENCOUNTER — Ambulatory Visit: Payer: Self-pay

## 2017-05-05 ENCOUNTER — Other Ambulatory Visit: Payer: Self-pay | Admitting: Geriatric Medicine

## 2017-05-05 DIAGNOSIS — N644 Mastodynia: Secondary | ICD-10-CM

## 2017-05-07 ENCOUNTER — Ambulatory Visit
Admission: RE | Admit: 2017-05-07 | Discharge: 2017-05-07 | Disposition: A | Discharge status: Home / Self Care | Payer: Medicare Other | Source: Ambulatory Visit | Attending: Geriatric Medicine | Admitting: Geriatric Medicine

## 2017-05-07 DIAGNOSIS — N644 Mastodynia: Secondary | ICD-10-CM

## 2017-05-16 ENCOUNTER — Telehealth: Payer: Self-pay | Admitting: Obstetrics and Gynecology

## 2017-05-16 NOTE — Telephone Encounter (Signed)
Left patient a message to call back to reschedule a future appointment that was cancelled by the provider. °

## 2017-06-20 ENCOUNTER — Ambulatory Visit: Payer: Medicare Other | Admitting: Nurse Practitioner

## 2017-07-03 ENCOUNTER — Ambulatory Visit (INDEPENDENT_AMBULATORY_CARE_PROVIDER_SITE_OTHER): Payer: Medicare Other | Admitting: Obstetrics and Gynecology

## 2017-07-03 ENCOUNTER — Encounter: Payer: Self-pay | Admitting: Obstetrics and Gynecology

## 2017-07-03 VITALS — BP 128/70 | HR 70 | Resp 16 | Ht 69.0 in | Wt 213.0 lb

## 2017-07-03 DIAGNOSIS — Z853 Personal history of malignant neoplasm of breast: Secondary | ICD-10-CM

## 2017-07-03 DIAGNOSIS — Z01419 Encounter for gynecological examination (general) (routine) without abnormal findings: Secondary | ICD-10-CM

## 2017-07-03 NOTE — Progress Notes (Signed)
76 y.o. G1P1001 WidowedCaucasianF here for annual exam.  No vaginal bleeding. Not sexually active. Husband died 55 years ago at 54 (suicide, he was the love of her life, no partner since then). She has 2 sons (one is adopted), 47 and 87, both in [place]. She has 2 grand children. She plans to stay in [place], has lots of friends here, loves her part time job.     Patient's last menstrual period was 12/12/1997.          Sexually active: No.  The current method of family planning is post menopausal status.    Exercising: No.  The patient does not participate in regular exercise at present. Smoker:  former  Health Maintenance: Pap:  03/23/13 Neg.  History of abnormal Pap:  no MMG:  05/07/17 BIRADS2:Benign  Colonoscopy:  03/01/14  BMD:   09/21/14 Normal  TDaP:  08/2015    reports that she quit smoking about 25 years ago. Her smoking use included Cigarettes. She started smoking about 55 years ago. She has a 7.50 pack-year smoking history. She has never used smokeless tobacco. She reports that she does not drink alcohol or use drugs. She managed the board of Elections, she is now working part time. Yolanda Bonine is 58, granddaughter is 5  Past Medical History:  Diagnosis Date  . Breast CA ([place]) 04/2009   right - radiation and lumpectomy  . Diabetes mellitus 2000  . Family history of breast cancer    aunt  . Hypercholesteremia 2000  . Hypertension   . 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 Postmenopausal    took HRT 1999 - 2004    Past Surgical History:  Procedure Laterality Date  . BREAST LUMPECTOMY  04/2009   rt breast  estrogen +, Her 2 Nu negative  . COLONOSCOPY WITH PROPOFOL N/A 04/05/2014   Procedure: COLONOSCOPY WITH PROPOFOL;  Surgeon: Garlan Fair, MD;  Location: [facility];  Service: Endoscopy;  Laterality: N/A;  . ORIF FOOT FRACTURE Right 04/20/2014   done in [place]    Current Outpatient Prescriptions  Medication Sig Dispense Refill  . aspirin 81 MG tablet  Take 81 mg by mouth daily.      . brimonidine (ALPHAGAN) 0.2 % ophthalmic solution Place 1 drop into the right eye 2 (two) times daily.    . Bromfenac Sodium (PROLENSA) 0.07 % SOLN Place 1 drop into the right eye 2 (two) times daily.    . Cholecalciferol (VITAMIN D-3 PO) Take 2,000 Units by mouth.     . Cyanocobalamin (B-12 PO) Take 1,000 mg by mouth daily.     . dorzolamide-timolol (COSOPT) 22.3-6.8 MG/ML ophthalmic solution Place 1 drop into the right eye 3 (three) times daily.    Marland Kitchen ezetimibe (ZETIA) 10 MG tablet Take 10 mg by mouth every evening.     . gabapentin (NEURONTIN) 600 MG tablet Take 600 mg by mouth 3 (three) times daily.    Marland Kitchen HUMALOG MIX 75/25 KWIKPEN (75-25) 100 UNIT/ML Kwikpen Inject 30 Units into the skin 2 (two) times daily.     Marland Kitchen lisinopril (PRINIVIL,ZESTRIL) 2.5 MG tablet Take 2.5 mg by mouth every morning.     Marland Kitchen LOTEMAX 0.5 % GEL Place 1 drop into the right eye 2 (two) times daily.    . metFORMIN (GLUCOPHAGE) 1000 MG tablet Take 1,000 mg by mouth 2 (two) times daily with a meal.    . Multiple Vitamin (MULTIVITAMIN WITH MINERALS) TABS tablet Take 0.5 tablets by mouth 2 (two)  times daily.    . Multiple Vitamins-Minerals (PRESERVISION AREDS 2) CAPS Take 2 capsules by mouth daily.    . Omega-3 Fatty Acids (FISH OIL PO) Take 1,200 mg by mouth 2 (two) times daily.     . rosuvastatin (CRESTOR) 40 MG tablet Take 40 mg by mouth daily.       No current facility-administered medications for this visit.     Family History  Problem Relation Age of Onset  . Heart disease Mother        Confestive Heart Failure  . Heart failure Mother   . Diabetes Father   . Heart disease Father        Congestive Heart Failure  . Heart failure Father   . Obesity Brother     Review of Systems  Constitutional: Negative.   HENT: Negative.   Eyes: Negative.   Respiratory: Negative.   Cardiovascular: Negative.   Gastrointestinal: Negative.   Endocrine: Negative.   Genitourinary: Negative.    Musculoskeletal: Negative.   Skin: Negative.   Allergic/Immunologic: Negative.   Neurological: Negative.   Psychiatric/Behavioral: Negative.     Exam:   LMP 12/12/1997   Weight change: @WEIGHTCHANGE @ Height:      Ht Readings from Last 3 Encounters:  06/19/16 5\' 9"  (1.753 m)  09/29/15 5\' 11"  (1.803 m)  09/08/15 5\' 11"  (1.803 m)    General appearance: alert, cooperative and appears stated age Head: Normocephalic, without obvious abnormality, atraumatic Neck: no adenopathy, supple, symmetrical, trachea midline and thyroid normal to inspection and palpation Lungs: clear to auscultation bilaterally Cardiovascular: regular rate and rhythm Breasts: normal appearance, no masses or tenderness, evidence of right lumpectomy (slightly tender) Abdomen: soft, non-tender; bowel sounds normal; no masses,  no organomegaly Extremities: extremities normal, atraumatic, no cyanosis or edema Skin: Skin color, texture, turgor normal. No rashes or lesions Lymph nodes: Cervical, supraclavicular, and axillary nodes normal. No abnormal inguinal nodes palpated Neurologic: Grossly normal   Pelvic: External genitalia:  no lesions              Urethra:  normal appearing urethra with no masses, tenderness or lesions              Bartholins and Skenes: normal                 Vagina: normal appearing atrophic vagina with normal color and discharge, no lesions              Cervix: no lesions               Bimanual Exam:  Uterus:  normal size, contour, position, consistency, mobility, non-tender              Adnexa: no mass, fullness, tenderness               Rectovaginal: Confirms               Anus:  normal sphincter tone, no lesions  Chaperone was present for exam.  A:  Well Woman with normal exam  H/O breast cancer  P:   No pap  Mammogram is UTD  Colonoscopy UTD  Labs with primary MD  Discussed breast self exam  Discussed calcium and vit D intake

## 2017-07-03 NOTE — Patient Instructions (Signed)

## 2017-09-20 ENCOUNTER — Other Ambulatory Visit: Payer: Self-pay | Admitting: Nurse Practitioner

## 2017-09-26 ENCOUNTER — Other Ambulatory Visit: Payer: Self-pay

## 2017-09-26 ENCOUNTER — Other Ambulatory Visit (HOSPITAL_BASED_OUTPATIENT_CLINIC_OR_DEPARTMENT_OTHER): Payer: Medicare Other

## 2017-09-26 ENCOUNTER — Ambulatory Visit (HOSPITAL_BASED_OUTPATIENT_CLINIC_OR_DEPARTMENT_OTHER): Payer: Medicare Other | Admitting: Family

## 2017-09-26 ENCOUNTER — Encounter: Payer: Self-pay | Admitting: Family

## 2017-09-26 ENCOUNTER — Ambulatory Visit (HOSPITAL_BASED_OUTPATIENT_CLINIC_OR_DEPARTMENT_OTHER): Payer: Medicare Other

## 2017-09-26 VITALS — BP 131/65 | HR 64 | Temp 97.6°F | Wt 210.0 lb

## 2017-09-26 DIAGNOSIS — C50911 Malignant neoplasm of unspecified site of right female breast: Secondary | ICD-10-CM

## 2017-09-26 DIAGNOSIS — C50912 Malignant neoplasm of unspecified site of left female breast: Secondary | ICD-10-CM

## 2017-09-26 DIAGNOSIS — C50012 Malignant neoplasm of nipple and areola, left female breast: Principal | ICD-10-CM

## 2017-09-26 DIAGNOSIS — C50011 Malignant neoplasm of nipple and areola, right female breast: Secondary | ICD-10-CM

## 2017-09-26 DIAGNOSIS — Z170 Estrogen receptor positive status [ER+]: Secondary | ICD-10-CM | POA: Diagnosis not present

## 2017-09-26 DIAGNOSIS — E559 Vitamin D deficiency, unspecified: Secondary | ICD-10-CM

## 2017-09-26 DIAGNOSIS — Z794 Long term (current) use of insulin: Secondary | ICD-10-CM

## 2017-09-26 DIAGNOSIS — E1165 Type 2 diabetes mellitus with hyperglycemia: Secondary | ICD-10-CM

## 2017-09-26 DIAGNOSIS — M818 Other osteoporosis without current pathological fracture: Secondary | ICD-10-CM

## 2017-09-26 LAB — CMP (CANCER CENTER ONLY)
ALK PHOS: 48 U/L (ref 26–84)
ALT(SGPT): 23 U/L (ref 10–47)
AST: 24 U/L (ref 11–38)
Albumin: 3.5 g/dL (ref 3.3–5.5)
BUN, Bld: 11 mg/dL (ref 7–22)
CO2: 27 mEq/L (ref 18–33)
Calcium: 10 mg/dL (ref 8.0–10.3)
Chloride: 106 mEq/L (ref 98–108)
Creat: 1.1 mg/dl (ref 0.6–1.2)
GLUCOSE: 104 mg/dL (ref 73–118)
POTASSIUM: 4.3 meq/L (ref 3.3–4.7)
SODIUM: 144 meq/L (ref 128–145)
TOTAL PROTEIN: 6.5 g/dL (ref 6.4–8.1)
Total Bilirubin: 0.8 mg/dl (ref 0.20–1.60)

## 2017-09-26 LAB — CBC WITH DIFFERENTIAL (CANCER CENTER ONLY)
BASO#: 0 10*3/uL (ref 0.0–0.2)
BASO%: 0.4 % (ref 0.0–2.0)
EOS%: 5.3 % (ref 0.0–7.0)
Eosinophils Absolute: 0.5 10*3/uL (ref 0.0–0.5)
HEMATOCRIT: 39.5 % (ref 34.8–46.6)
HGB: 13.2 g/dL (ref 11.6–15.9)
LYMPH#: 1.5 10*3/uL (ref 0.9–3.3)
LYMPH%: 18.3 % (ref 14.0–48.0)
MCH: 29.7 pg (ref 26.0–34.0)
MCHC: 33.4 g/dL (ref 32.0–36.0)
MCV: 89 fL (ref 81–101)
MONO#: 0.7 10*3/uL (ref 0.1–0.9)
MONO%: 8.3 % (ref 0.0–13.0)
NEUT#: 5.7 10*3/uL (ref 1.5–6.5)
NEUT%: 67.7 % (ref 39.6–80.0)
PLATELETS: 193 10*3/uL (ref 145–400)
RBC: 4.44 10*6/uL (ref 3.70–5.32)
RDW: 14.6 % (ref 11.1–15.7)
WBC: 8.4 10*3/uL (ref 3.9–10.0)

## 2017-09-26 MED ORDER — ZOLEDRONIC ACID 4 MG/100ML IV SOLN
4.0000 mg | Freq: Once | INTRAVENOUS | Status: AC
  Administered 2017-09-26: 4 mg via INTRAVENOUS
  Filled 2017-09-26: qty 100

## 2017-09-26 MED ORDER — SODIUM CHLORIDE 0.9 % IV SOLN
Freq: Once | INTRAVENOUS | Status: AC
  Administered 2017-09-26: 13:00:00 via INTRAVENOUS

## 2017-09-26 NOTE — Progress Notes (Signed)
Hematology and Oncology Follow Up Visit  TIERRIA WATSON [id number] 13-Apr-1941 76 y.o. 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 Hone is here today for her annual follow-up. She is doing quite well and has no complaints at this time.  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 She enjoys staying busy.  Breast exam today was unremarkable. Her mammogram in June was negative.  She has had no issue with infections. No fever, chills, n/v, cough, rash, dizziness, SOB, chest pain, palpitations, abdominal pain or changes in bowel or bladder habits.  She has puffiness in her lower extremities that is unchanged. No redness, pain or edema. Pedal pulses are +1.  The neuropathy in her feet is unchanged.  She has maintained a good appetite and is staying well hydrated. Her weight is stable.  Her blood sugars are much better controlled. Hgb A1c in September was 6.5.  She walks at the mall several times a week for exercise.   ECOG Performance Status: 0 - Asymptomatic  Medications:  Allergies as of 09/26/2017   No Known Allergies     Medication List        Accurate as of 09/26/17  2:45 PM. Always use your most recent med list.          aspirin 81 MG tablet Take 81 mg by mouth daily.   B-12 PO Take 1,000 mg by mouth daily.   CALCIUM 600 600 MG Tabs tablet Generic drug:  calcium carbonate Take 600 mg by mouth 2 (two) times daily with a meal.   FISH OIL PO Take 1,200 mg by mouth 2 (two) times daily.   gabapentin 600 MG tablet Commonly known as:  NEURONTIN Take 600 mg by mouth 3 (three) times daily.   HUMALOG MIX 75/25 KWIKPEN (75-25) 100 UNIT/ML Kwikpen Generic drug:  Insulin Lispro Prot & Lispro Inject 30 Units into the skin 2 (two) times daily.   metFORMIN 1000  MG tablet Commonly known as:  GLUCOPHAGE Take 1,000 mg by mouth 2 (two) times daily with a meal.   multivitamin with minerals Tabs tablet Take 0.5 tablets by mouth 2 (two) times daily.   PRESERVISION AREDS 2 Caps Take 2 capsules by mouth daily.   rosuvastatin 40 MG tablet Commonly known as:  CRESTOR Take 40 mg by mouth daily.   VITAMIN D-3 PO Take 2,000 Units by mouth.   ZETIA 10 MG tablet Generic drug:  ezetimibe Take 10 mg by mouth every evening.       Allergies: No Known Allergies  Past Medical History, Surgical history, Social history, and Family History were reviewed and updated.  Review of Systems: All other 10 point review of systems is negative.   Physical Exam:  weight is 210 lb (95.3 kg). Her oral temperature is 97.6 F (36.4 C). Her blood pressure is 131/65 and her pulse is 64. Her oxygen saturation is 100%.   Wt Readings from Last 3 Encounters:  09/26/17 210 lb (95.3 kg)  07/03/17 213 lb (96.6 kg)  09/27/16 214 lb 12.8 oz (97.4 kg)    Ocular: Sclerae unicteric, pupils equal, round and reactive to light Ear-nose-throat: Oropharynx clear, dentition fair Lymphatic: No cervical, supraclavicular or axillary adenopathy Lungs no rales or rhonchi, good excursion bilaterally Heart  regular rate and rhythm, no murmur appreciated Abd soft, nontender, positive bowel sounds, no liver or spleen tip palpated on exam, no fluid wave  MSK no focal spinal tenderness, no joint edema Neuro: non-focal, well-oriented, appropriate affect Breasts: No changes. No mass, lesion or rash noted. Right breast lumpectomy site intact.   Lab Results  Component Value Date   WBC 8.4 09/26/2017   HGB 13.2 09/26/2017   HCT 39.5 09/26/2017   MCV 89 09/26/2017   PLT 193 09/26/2017   No results found for: FERRITIN, IRON, TIBC, UIBC, IRONPCTSAT Lab Results  Component Value Date   RBC 4.44 09/26/2017   No results found for: KPAFRELGTCHN, LAMBDASER, KAPLAMBRATIO No results found for:  IGGSERUM, IGA, IGMSERUM No results found for: Odetta Pink, SPEI   Chemistry      Component Value Date/Time   NA 144 09/26/2017 1143   NA 138 09/29/2015 1410   K 4.3 09/26/2017 1143   K 4.5 09/29/2015 1410   CL 106 09/26/2017 1143   CO2 27 09/26/2017 1143   CO2 27 09/29/2015 1410   BUN 11 09/26/2017 1143   BUN 17.4 09/29/2015 1410   CREATININE 1.1 09/26/2017 1143   CREATININE 1.0 09/29/2015 1410      Component Value Date/Time   CALCIUM 10.0 09/26/2017 1143   CALCIUM 9.5 09/29/2015 1410   ALKPHOS 48 09/26/2017 1143   ALKPHOS 51 09/29/2015 1410   AST 24 09/26/2017 1143   AST 18 09/29/2015 1410   ALT 23 09/26/2017 1143   ALT 15 09/29/2015 1410   BILITOT 0.80 09/26/2017 1143   BILITOT 0.37 09/29/2015 1410      Impression and Plan: Ms. Nolet is a very pleasant 76 yo caucasian female with history of stage I infiltrating ductal carcinoma of the right breast with lumpectomy and radiation. She completed 5 years of Femara in October 2015. She continues to do well and so far there has been no evidence of recurrence.  We will proceed with Zometa today as planned.  We will plan to see her back again in another year for follow-up, lab and infusion.  She will contact our office with any questions or concerns. We can certainly see him sooner if need be.   Eliezer Bottom, NP 11/16/20182:45 PM

## 2017-09-27 LAB — VITAMIN D 25 HYDROXY (VIT D DEFICIENCY, FRACTURES): Vitamin D, 25-Hydroxy: 47.2 ng/mL (ref 30.0–100.0)

## 2018-04-03 ENCOUNTER — Other Ambulatory Visit: Payer: Self-pay | Admitting: Geriatric Medicine

## 2018-04-03 DIAGNOSIS — Z1231 Encounter for screening mammogram for malignant neoplasm of breast: Secondary | ICD-10-CM

## 2018-05-12 ENCOUNTER — Ambulatory Visit: Payer: Self-pay

## 2018-06-16 ENCOUNTER — Other Ambulatory Visit: Payer: Self-pay | Admitting: Geriatric Medicine

## 2018-06-16 ENCOUNTER — Ambulatory Visit
Admission: RE | Admit: 2018-06-16 | Discharge: 2018-06-16 | Disposition: A | Discharge status: Home / Self Care | Payer: Medicare Other | Source: Ambulatory Visit | Attending: Geriatric Medicine | Admitting: Geriatric Medicine

## 2018-06-16 DIAGNOSIS — I739 Peripheral vascular disease, unspecified: Secondary | ICD-10-CM

## 2018-06-16 DIAGNOSIS — Z1231 Encounter for screening mammogram for malignant neoplasm of breast: Secondary | ICD-10-CM

## 2018-06-17 ENCOUNTER — Other Ambulatory Visit: Payer: Self-pay | Admitting: Geriatric Medicine

## 2018-06-17 DIAGNOSIS — R928 Other abnormal and inconclusive findings on diagnostic imaging of breast: Secondary | ICD-10-CM

## 2018-06-22 ENCOUNTER — Ambulatory Visit
Admission: RE | Admit: 2018-06-22 | Discharge: 2018-06-22 | Disposition: A | Discharge status: Home / Self Care | Payer: Medicare Other | Source: Ambulatory Visit | Attending: Geriatric Medicine | Admitting: Geriatric Medicine

## 2018-06-22 ENCOUNTER — Ambulatory Visit: Payer: Medicare Other

## 2018-06-22 DIAGNOSIS — R928 Other abnormal and inconclusive findings on diagnostic imaging of breast: Secondary | ICD-10-CM

## 2018-09-25 ENCOUNTER — Other Ambulatory Visit: Payer: Self-pay

## 2018-09-25 ENCOUNTER — Inpatient Hospital Stay: Payer: Medicare Other

## 2018-09-25 ENCOUNTER — Encounter: Payer: Self-pay | Admitting: Hematology & Oncology

## 2018-09-25 ENCOUNTER — Telehealth: Payer: Self-pay | Admitting: Hematology & Oncology

## 2018-09-25 ENCOUNTER — Inpatient Hospital Stay: Payer: Medicare Other | Attending: Hematology & Oncology | Admitting: Hematology & Oncology

## 2018-09-25 VITALS — BP 127/61 | HR 74 | Temp 98.2°F | Resp 16 | Wt 208.0 lb

## 2018-09-25 DIAGNOSIS — C50012 Malignant neoplasm of nipple and areola, left female breast: Secondary | ICD-10-CM

## 2018-09-25 DIAGNOSIS — M818 Other osteoporosis without current pathological fracture: Secondary | ICD-10-CM

## 2018-09-25 DIAGNOSIS — E559 Vitamin D deficiency, unspecified: Secondary | ICD-10-CM

## 2018-09-25 DIAGNOSIS — Z79899 Other long term (current) drug therapy: Secondary | ICD-10-CM

## 2018-09-25 DIAGNOSIS — C50011 Malignant neoplasm of nipple and areola, right female breast: Secondary | ICD-10-CM | POA: Diagnosis not present

## 2018-09-25 DIAGNOSIS — C50911 Malignant neoplasm of unspecified site of right female breast: Secondary | ICD-10-CM | POA: Insufficient documentation

## 2018-09-25 DIAGNOSIS — Z170 Estrogen receptor positive status [ER+]: Secondary | ICD-10-CM | POA: Diagnosis not present

## 2018-09-25 DIAGNOSIS — M8000XD Age-related osteoporosis with current pathological fracture, unspecified site, subsequent encounter for fracture with routine healing: Secondary | ICD-10-CM

## 2018-09-25 DIAGNOSIS — Z923 Personal history of irradiation: Secondary | ICD-10-CM

## 2018-09-25 DIAGNOSIS — C50912 Malignant neoplasm of unspecified site of left female breast: Secondary | ICD-10-CM

## 2018-09-25 LAB — CBC WITH DIFFERENTIAL (CANCER CENTER ONLY)
ABS IMMATURE GRANULOCYTES: 0.02 10*3/uL (ref 0.00–0.07)
BASOS PCT: 1 %
Basophils Absolute: 0.1 10*3/uL (ref 0.0–0.1)
EOS PCT: 3 %
Eosinophils Absolute: 0.3 10*3/uL (ref 0.0–0.5)
HCT: 42.3 % (ref 36.0–46.0)
Hemoglobin: 13.3 g/dL (ref 12.0–15.0)
Immature Granulocytes: 0 %
Lymphocytes Relative: 17 %
Lymphs Abs: 1.4 10*3/uL (ref 0.7–4.0)
MCH: 28.8 pg (ref 26.0–34.0)
MCHC: 31.4 g/dL (ref 30.0–36.0)
MCV: 91.6 fL (ref 80.0–100.0)
MONO ABS: 0.7 10*3/uL (ref 0.1–1.0)
Monocytes Relative: 8 %
NEUTROS ABS: 6 10*3/uL (ref 1.7–7.7)
Neutrophils Relative %: 71 %
PLATELETS: 216 10*3/uL (ref 150–400)
RBC: 4.62 MIL/uL (ref 3.87–5.11)
RDW: 14.5 % (ref 11.5–15.5)
WBC: 8.4 10*3/uL (ref 4.0–10.5)
nRBC: 0 % (ref 0.0–0.2)

## 2018-09-25 LAB — CMP (CANCER CENTER ONLY)
ALBUMIN: 3.9 g/dL (ref 3.5–5.0)
ALT: 13 U/L (ref 0–44)
AST: 18 U/L (ref 15–41)
Alkaline Phosphatase: 54 U/L (ref 38–126)
Anion gap: 10 (ref 5–15)
BILIRUBIN TOTAL: 0.5 mg/dL (ref 0.3–1.2)
BUN: 16 mg/dL (ref 8–23)
CALCIUM: 10.3 mg/dL (ref 8.9–10.3)
CO2: 26 mmol/L (ref 22–32)
Chloride: 105 mmol/L (ref 98–111)
Creatinine: 0.91 mg/dL (ref 0.44–1.00)
GFR, Est AFR Am: >60 mL/min (ref >60)
GLUCOSE: 59 mg/dL — AB (ref 70–99)
Potassium: 4.1 mmol/L (ref 3.5–5.1)
Sodium: 141 mmol/L (ref 135–145)
TOTAL PROTEIN: 7 g/dL (ref 6.5–8.1)

## 2018-09-25 MED ORDER — ZOLEDRONIC ACID 4 MG/100ML IV SOLN
4.0000 mg | Freq: Once | INTRAVENOUS | Status: AC
  Administered 2018-09-25: 4 mg via INTRAVENOUS
  Filled 2018-09-25: qty 100

## 2018-09-25 MED ORDER — SODIUM CHLORIDE 0.9 % IV SOLN
INTRAVENOUS | Status: DC
  Administered 2018-09-25: 12:00:00 via INTRAVENOUS
  Filled 2018-09-25: qty 250

## 2018-09-25 NOTE — Progress Notes (Signed)
Hematology and Oncology Follow Up Visit  CAMRIN LAPRE [id number] [birth year] 77 y.o. 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 Mattice is here today for her annual follow-up.  She is busy.  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  As always, she is busy a couple weeks ago.  She will be going down to [place] for Thanksgiving.  She has her family who lives down there.  She had a mammogram done in August 2019.  Everything was fine on the mammogram.  She is had no issues with nausea or vomiting.  She is had no bleeding.  There is been no change in bowel or bladder habits.  She is had no rashes.  She is had no cough or shortness of breath.  Overall, her performance status is ECOG 1.     Medications:  Allergies as of 09/25/2018   No Known Allergies     Medication List        Accurate as of 09/25/18 10:55 AM. Always use your most recent med list.          aspirin 81 MG tablet Take 81 mg by mouth daily.   B-12 PO Take 1,000 mg by mouth daily.   CALCIUM 600 600 MG Tabs tablet Generic drug:  calcium carbonate Take 600 mg by mouth 2 (two) times daily with a meal.   FISH OIL PO Take 1,200 mg by mouth 2 (two) times daily.   gabapentin 600 MG tablet Commonly known as:  NEURONTIN Take 600 mg by mouth 3 (three) times daily.   HUMALOG MIX 75/25 KWIKPEN (75-25) 100 UNIT/ML Kwikpen Generic drug:  Insulin Lispro Prot & Lispro Inject 30 Units into the skin 2 (two) times daily.   metFORMIN 1000 MG tablet Commonly known as:  GLUCOPHAGE Take 1,000 mg by mouth 2 (two) times daily with a meal.   multivitamin with minerals Tabs tablet Take 0.5 tablets by mouth 2 (two) times daily.   PRESERVISION AREDS 2 Caps Take 2 capsules by mouth daily.     rosuvastatin 40 MG tablet Commonly known as:  CRESTOR Take 40 mg by mouth daily.   VITAMIN D-3 PO Take 2,000 Units by mouth.   ZETIA 10 MG tablet Generic drug:  ezetimibe Take 10 mg by mouth every evening.       Allergies: No Known Allergies  Past Medical History, Surgical history, Social history, and Family History were reviewed and updated.  Review of Systems: All other 10 point review of systems is negative.   Physical Exam:  weight is 208 lb (94.3 kg). Her oral temperature is 98.2 F (36.8 C). Her blood pressure is 127/61 and her pulse is 74. Her respiration is 16 and oxygen saturation is 100%.   Wt Readings from Last 3 Encounters:  09/25/18 208 lb (94.3 kg)  09/26/17 210 lb (95.3 kg)  07/03/17 213 lb (96.6 kg)    Ocular: Sclerae unicteric, pupils equal, round and reactive to light Ear-nose-throat: Oropharynx clear, dentition fair Lymphatic: No cervical, supraclavicular or axillary adenopathy Lungs no rales or rhonchi, good excursion bilaterally Heart regular rate and rhythm, no murmur appreciated Abd soft, nontender, positive bowel sounds, no liver or spleen tip palpated on exam, no fluid wave  MSK no  focal spinal tenderness, no joint edema Neuro: non-focal, well-oriented, appropriate affect Breasts: No changes. No mass, lesion or rash noted. Right breast lumpectomy site intact.   Lab Results  Component Value Date   WBC 8.4 09/25/2018   HGB 13.3 09/25/2018   HCT 42.3 09/25/2018   MCV 91.6 09/25/2018   PLT 216 09/25/2018   No results found for: FERRITIN, IRON, TIBC, UIBC, IRONPCTSAT Lab Results  Component Value Date   RBC 4.62 09/25/2018   No results found for: KPAFRELGTCHN, LAMBDASER, KAPLAMBRATIO No results found for: IGGSERUM, IGA, IGMSERUM No results found for: Odetta Pink, SPEI   Chemistry      Component Value Date/Time   NA 144 09/26/2017 1143   NA 138 09/29/2015 1410   K 4.3 09/26/2017  1143   K 4.5 09/29/2015 1410   CL 106 09/26/2017 1143   CO2 27 09/26/2017 1143   CO2 27 09/29/2015 1410   BUN 11 09/26/2017 1143   BUN 17.4 09/29/2015 1410   CREATININE 1.1 09/26/2017 1143   CREATININE 1.0 09/29/2015 1410      Component Value Date/Time   CALCIUM 10.0 09/26/2017 1143   CALCIUM 9.5 09/29/2015 1410   ALKPHOS 48 09/26/2017 1143   ALKPHOS 51 09/29/2015 1410   AST 24 09/26/2017 1143   AST 18 09/29/2015 1410   ALT 23 09/26/2017 1143   ALT 15 09/29/2015 1410   BILITOT 0.80 09/26/2017 1143   BILITOT 0.37 09/29/2015 1410      Impression and Plan: Ms. Vickie Burns is a very pleasant 77 yo caucasian female with history of stage I infiltrating ductal carcinoma of the right breast.    She was treated with lumpectomy and radiation.   She completed 5 years of Femara in October 2015. She continues to do well and so far there has been no evidence of recurrence.   We will proceed with Zometa today as planned.   We will plan to see her back again in another year for follow-up, lab and Zometa infusion.   She will contact our office with any questions or concerns. We can certainly see him sooner if need be.   Volanda Napoleon, MD 11/15/201910:55 AM

## 2018-09-25 NOTE — Patient Instructions (Signed)

## 2018-09-25 NOTE — Telephone Encounter (Signed)
Appointments scheduled letter/calendar mailed to patient per 11/15 los

## 2018-09-26 LAB — VITAMIN D 25 HYDROXY (VIT D DEFICIENCY, FRACTURES): Vit D, 25-Hydroxy: 68.6 ng/mL (ref 30.0–100.0)

## 2018-09-28 NOTE — Progress Notes (Signed)
77 y.o. G52P1001 Widowed White or Caucasian Not Hispanic or Latino female here for annual exam.  No vaginal bleeding. No bowel or bladder c/o. No medical changes. Diabetes is stable.     Patient's last menstrual period was 12/12/1997.          Sexually active: No.  The current method of family planning is post menopausal status.    Exercising: Yes.    walking Smoker:  no  Health Maintenance: Pap:  03/23/13 Neg History of abnormal Pap:  No MMG: 06/16/2018 Birads 0, 06/22/2018 left breast US Birads 2 benign Colonoscopy:  03/01/14 Normal BMD:   09/21/14 Normal  TDaP:  08/2015    reports that she quit smoking about 26 years ago. Her smoking use included cigarettes. She started smoking about 56 years ago. She has a 7.50 pack-year smoking history. She has never used smokeless tobacco. She reports that she does not drink alcohol or use drugs. Retired from [employer], still works there part time. Husband died 53 years ago at 62 (suicide, he was the love of her life, no partner since then). She has 2 sons (one is adopted), both in [place]. She has 2 grand children, 64 year old grandson and 67 year old granddaughter. She plans to stay in [place], has lots of friends here, loves her part time job  Past Medical History:  Diagnosis Date  . Breast CA ([place]) 04/2009   right - radiation and lumpectomy  . Diabetes mellitus 2000  . Family history of breast cancer    aunt  . Hypercholesteremia 2000  . Hypertension   . 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 Postmenopausal    took HRT 1999 - 2004    Past Surgical History:  Procedure Laterality Date  . BREAST LUMPECTOMY  04/2009   rt breast  estrogen +, Her 2 Nu negative  . COLONOSCOPY WITH PROPOFOL N/A 04/05/2014   Procedure: COLONOSCOPY WITH PROPOFOL;  Surgeon: Garlan Fair, MD;  Location: [facility];  Service: Endoscopy;  Laterality: N/A;  . ORIF FOOT FRACTURE Right 04/20/2014   done in [place]    Current Outpatient  Medications  Medication Sig Dispense Refill  . aspirin 81 MG tablet Take 81 mg by mouth daily.      . calcium carbonate (CALCIUM 600) 600 MG TABS tablet Take 600 mg by mouth 2 (two) times daily with a meal.    . Cholecalciferol (VITAMIN D-3 PO) Take 2,000 Units by mouth.     . Cyanocobalamin (B-12 PO) Take 1,000 mg by mouth daily.     . empagliflozin (JARDIANCE) 10 MG TABS tablet Take 10 mg by mouth daily.    Marland Kitchen ezetimibe (ZETIA) 10 MG tablet Take 10 mg by mouth every evening.     . gabapentin (NEURONTIN) 600 MG tablet Take 600 mg by mouth 3 (three) times daily.    Marland Kitchen HUMALOG MIX 75/25 KWIKPEN (75-25) 100 UNIT/ML Kwikpen Inject 30 Units into the skin 2 (two) times daily.     . metFORMIN (GLUCOPHAGE) 1000 MG tablet Take 1,000 mg by mouth 2 (two) times daily with a meal.    . Multiple Vitamins-Minerals (PRESERVISION AREDS 2) CAPS Take 2 capsules by mouth daily.    . rosuvastatin (CRESTOR) 40 MG tablet Take 40 mg by mouth daily.       No current facility-administered medications for this visit.     Family History  Problem Relation Age of Onset  . Heart disease Mother  Confestive Heart Failure  . Heart failure Mother   . Diabetes Father   . Heart disease Father        Congestive Heart Failure  . Heart failure Father   . Obesity Brother     Review of Systems  Constitutional: Negative.   HENT: Negative.   Eyes: Negative.   Respiratory: Negative.   Cardiovascular: Negative.   Gastrointestinal: Negative.   Endocrine: Negative.   Genitourinary: Negative.   Musculoskeletal: Positive for back pain.  Skin: Negative.   Allergic/Immunologic: Negative.   Neurological: Positive for headaches.  Hematological: Negative.   Psychiatric/Behavioral: Negative.     Exam:   BP 132/82 (BP Location: Right Arm, Patient Position: Sitting, Cuff Size: Normal)   Pulse 68   Ht 5\' 9"  (1.753 m)   Wt 205 lb 6.4 oz (93.2 kg)   LMP 12/12/1997   BMI 30.33 kg/m   Weight change: @WEIGHTCHANGE @  Height:   Height: 5\' 9"  (175.3 cm)  Ht Readings from Last 3 Encounters:  09/29/18 5\' 9"  (1.753 m)  07/03/17 5\' 9"  (1.753 m)  06/19/16 5\' 9"  (1.753 m)    General appearance: alert, cooperative and appears stated age Head: Normocephalic, without obvious abnormality, atraumatic Neck: no adenopathy, supple, symmetrical, trachea midline and thyroid normal to inspection and palpation Lungs: clear to auscultation bilaterally Cardiovascular: regular rate and rhythm Breasts: normal appearance, no masses or tenderness, evidence of right lumpectomy Abdomen: soft, non-tender; non distended,  no masses,  no organomegaly Extremities: extremities normal, atraumatic, no cyanosis or edema Skin: Skin color, texture, turgor normal. No rashes or lesions Lymph nodes: Cervical, supraclavicular, and axillary nodes normal. No abnormal inguinal nodes palpated Neurologic: Grossly normal   Pelvic: External genitalia:  no lesions              Urethra:  normal appearing urethra with no masses, tenderness or lesions              Bartholins and Skenes: normal                 Vagina: atrophic appearing vagina with normal color and discharge, no lesions              Cervix: no lesions               Bimanual Exam:  Uterus:  normal size, contour, position, consistency, mobility, non-tender              Adnexa: no mass, fullness, tenderness               Rectovaginal: Confirms               Anus:  normal sphincter tone, no lesions  Chaperone was present for exam.  A:  Well Woman with normal exam  P:   Discussed breast self exam  Discussed calcium and vit D intake  DEXA UTD  Mammogram UTD  Colonoscopy UTD  No pap needed

## 2018-09-29 ENCOUNTER — Other Ambulatory Visit: Payer: Self-pay

## 2018-09-29 ENCOUNTER — Encounter: Payer: Self-pay | Admitting: Obstetrics and Gynecology

## 2018-09-29 ENCOUNTER — Ambulatory Visit (INDEPENDENT_AMBULATORY_CARE_PROVIDER_SITE_OTHER): Payer: Medicare Other | Admitting: Obstetrics and Gynecology

## 2018-09-29 VITALS — BP 132/82 | HR 68 | Ht 69.0 in | Wt 205.4 lb

## 2018-09-29 DIAGNOSIS — Z01419 Encounter for gynecological examination (general) (routine) without abnormal findings: Secondary | ICD-10-CM

## 2018-09-29 DIAGNOSIS — Z853 Personal history of malignant neoplasm of breast: Secondary | ICD-10-CM

## 2018-09-29 NOTE — Patient Instructions (Signed)
EXERCISE AND DIET:  We recommended that you start or continue a regular exercise program for good health. Regular exercise means any activity that makes your heart beat faster and makes you sweat.  We recommend exercising at least 30 minutes per day at least 3 days a week, preferably 4 or 5.  We also recommend a diet low in fat and sugar.  Inactivity, poor dietary choices and obesity can cause diabetes, heart attack, stroke, and kidney damage, among others.    ALCOHOL AND SMOKING:  Women should limit their alcohol intake to no more than 7 drinks/beers/glasses of wine (combined, not each!) per week. Moderation of alcohol intake to this level decreases your risk of breast cancer and liver damage. And of course, no recreational drugs are part of a healthy lifestyle.  And absolutely no smoking or even second hand smoke. Most people know smoking can cause heart and lung diseases, but did you know it also contributes to weakening of your bones? Aging of your skin?  Yellowing of your teeth and nails?  CALCIUM AND VITAMIN D:  Adequate intake of calcium and Vitamin D are recommended.  The recommendations for exact amounts of these supplements seem to change often, but generally speaking 1,200 mg of calcium (between diet and supplement) and 800 units of Vitamin D per day seems prudent. Certain women may benefit from higher intake of Vitamin D.  If you are among these women, your doctor will have told you during your visit.    PAP SMEARS:  Pap smears, to check for cervical cancer or precancers,  have traditionally been done yearly, although recent scientific advances have shown that most women can have pap smears less often.  However, every woman still should have a physical exam from her gynecologist every year. It will include a breast check, inspection of the vulva and vagina to check for abnormal growths or skin changes, a visual exam of the cervix, and then an exam to evaluate the size and shape of the uterus and  ovaries.  And after 75 years of age, a rectal exam is indicated to check for rectal cancers. We will also provide age appropriate advice regarding health maintenance, like when you should have certain vaccines, screening for sexually transmitted diseases, bone density testing, colonoscopy, mammograms, etc.   MAMMOGRAMS:  All women over 73 years old should have a yearly mammogram. Many facilities now offer a "3D" mammogram, which may cost around $50 extra out of pocket. If possible,  we recommend you accept the option to have the 3D mammogram performed.  It both reduces the number of women who will be called back for extra views which then turn out to be normal, and it is better than the routine mammogram at detecting truly abnormal areas.    COLONOSCOPY:  Colonoscopy to screen for colon cancer is recommended for all women at age 45.  We know, you hate the idea of the prep.  We agree, BUT, having colon cancer and not knowing it is worse!!  Colon cancer so often starts as a polyp that can be seen and removed at colonscopy, which can quite literally save your life!  And if your first colonoscopy is normal and you have no family history of colon cancer, most women don't have to have it again for 10 years.  Once every ten years, you can do something that may end up saving your life, right?  We will be happy to help you get it scheduled when you are ready.  Be sure to check your insurance coverage so you understand how much it will cost.  It may be covered as a preventative service at no cost, but you should check your particular policy.      Breast Self-Awareness Breast self-awareness means being familiar with how your breasts look and feel. It involves checking your breasts regularly and reporting any changes to your health care provider. Practicing breast self-awareness is important. A change in your breasts can be a sign of a serious medical problem. Being familiar with how your breasts look and feel allows  you to find any problems early, when treatment is more likely to be successful. All women should practice breast self-awareness, including women who have had breast implants. How to do a breast self-exam One way to learn what is normal for your breasts and whether your breasts are changing is to do a breast self-exam. To do a breast self-exam: Look for Changes  1. Remove all the clothing above your waist. 2. Stand in front of a mirror in a room with good lighting. 3. Put your hands on your hips. 4. Push your hands firmly downward. 5. Compare your breasts in the mirror. Look for differences between them (asymmetry), such as: ? Differences in shape. ? Differences in size. ? Puckers, dips, and bumps in one breast and not the other. 6. Look at each breast for changes in your skin, such as: ? Redness. ? Scaly areas. 7. Look for changes in your nipples, such as: ? Discharge. ? Bleeding. ? Dimpling. ? Redness. ? A change in position. Feel for Changes  Carefully feel your breasts for lumps and changes. It is best to do this while lying on your back on the floor and again while sitting or standing in the shower or tub with soapy water on your skin. Feel each breast in the following way:  Place the arm on the side of the breast you are examining above your head.  Feel your breast with the other hand.  Start in the nipple area and make  inch (2 cm) overlapping circles to feel your breast. Use the pads of your three middle fingers to do this. Apply light pressure, then medium pressure, then firm pressure. The light pressure will allow you to feel the tissue closest to the skin. The medium pressure will allow you to feel the tissue that is a little deeper. The firm pressure will allow you to feel the tissue close to the ribs.  Continue the overlapping circles, moving downward over the breast until you feel your ribs below your breast.  Move one finger-width toward the center of the body.  Continue to use the  inch (2 cm) overlapping circles to feel your breast as you move slowly up toward your collarbone.  Continue the up and down exam using all three pressures until you reach your armpit.  Write Down What You Find  Write down what is normal for each breast and any changes that you find. Keep a written record with breast changes or normal findings for each breast. By writing this information down, you do not need to depend only on memory for size, tenderness, or location. Write down where you are in your menstrual cycle, if you are still menstruating. If you are having trouble noticing differences in your breasts, do not get discouraged. With time you will become more familiar with the variations in your breasts and more comfortable with the exam. How often should I examine my breasts? Examine   your breasts every month. If you are breastfeeding, the best time to examine your breasts is after a feeding or after using a breast pump. If you menstruate, the best time to examine your breasts is 5-7 days after your period is over. During your period, your breasts are lumpier, and it may be more difficult to notice changes. When should I see my health care provider? See your health care provider if you notice:  A change in shape or size of your breasts or nipples.  A change in the skin of your breast or nipples, such as a reddened or scaly area.  Unusual discharge from your nipples.  A lump or thick area that was not there before.  Pain in your breasts.  Anything that concerns you.  This information is not intended to replace advice given to you by your health care provider. Make sure you discuss any questions you have with your health care provider. Document Released: 10/28/2005 Document Revised: 04/04/2016 Document Reviewed: 09/17/2015 Elsevier Interactive Patient Education  2018 [employer]  

## 2019-06-10 ENCOUNTER — Other Ambulatory Visit: Payer: Self-pay | Admitting: Geriatric Medicine

## 2019-06-10 DIAGNOSIS — N644 Mastodynia: Secondary | ICD-10-CM

## 2019-06-10 DIAGNOSIS — Z1231 Encounter for screening mammogram for malignant neoplasm of breast: Secondary | ICD-10-CM

## 2019-06-17 ENCOUNTER — Other Ambulatory Visit: Payer: Medicare Other

## 2019-06-21 ENCOUNTER — Other Ambulatory Visit: Payer: Medicare Other

## 2019-06-28 ENCOUNTER — Other Ambulatory Visit: Payer: Self-pay

## 2019-06-28 ENCOUNTER — Ambulatory Visit
Admission: RE | Admit: 2019-06-28 | Discharge: 2019-06-28 | Disposition: A | Discharge status: Home / Self Care | Payer: Medicare Other | Source: Ambulatory Visit | Attending: Geriatric Medicine | Admitting: Geriatric Medicine

## 2019-06-28 DIAGNOSIS — N644 Mastodynia: Secondary | ICD-10-CM

## 2019-09-27 ENCOUNTER — Other Ambulatory Visit: Payer: Self-pay | Admitting: *Deleted

## 2019-09-27 DIAGNOSIS — C50912 Malignant neoplasm of unspecified site of left female breast: Secondary | ICD-10-CM

## 2019-09-28 ENCOUNTER — Ambulatory Visit: Payer: Medicare Other

## 2019-09-28 ENCOUNTER — Ambulatory Visit: Payer: Medicare Other | Admitting: Hematology & Oncology

## 2019-09-28 ENCOUNTER — Other Ambulatory Visit: Payer: Medicare Other

## 2019-10-01 ENCOUNTER — Ambulatory Visit: Payer: Medicare Other | Admitting: Hematology & Oncology

## 2019-10-01 ENCOUNTER — Other Ambulatory Visit: Payer: Medicare Other

## 2019-10-01 ENCOUNTER — Ambulatory Visit: Payer: Medicare Other

## 2019-10-13 ENCOUNTER — Ambulatory Visit: Payer: Medicare Other | Admitting: Obstetrics and Gynecology

## 2020-01-31 ENCOUNTER — Telehealth: Payer: Self-pay | Admitting: Hematology & Oncology

## 2020-01-31 NOTE — Telephone Encounter (Signed)
Patient called in 3/22 to reschedule her appointments from November 2020.  She requested April 2021.  Appointments were scheduled as requested

## 2020-02-10 ENCOUNTER — Other Ambulatory Visit: Payer: Self-pay

## 2020-02-10 ENCOUNTER — Ambulatory Visit: Payer: Self-pay | Admitting: Obstetrics and Gynecology

## 2020-02-14 ENCOUNTER — Ambulatory Visit (INDEPENDENT_AMBULATORY_CARE_PROVIDER_SITE_OTHER): Payer: Medicare Other | Admitting: Obstetrics and Gynecology

## 2020-02-14 ENCOUNTER — Encounter: Payer: Self-pay | Admitting: Obstetrics and Gynecology

## 2020-02-14 ENCOUNTER — Other Ambulatory Visit: Payer: Self-pay

## 2020-02-14 VITALS — BP 128/74 | HR 88 | Temp 97.7°F | Ht 69.0 in | Wt 202.0 lb

## 2020-02-14 DIAGNOSIS — Z853 Personal history of malignant neoplasm of breast: Secondary | ICD-10-CM

## 2020-02-14 DIAGNOSIS — Z01419 Encounter for gynecological examination (general) (routine) without abnormal findings: Secondary | ICD-10-CM

## 2020-02-14 NOTE — Patient Instructions (Signed)
EXERCISE AND DIET:  We recommended that you start or continue a regular exercise program for good health. Regular exercise means any activity that makes your heart beat faster and makes you sweat.  We recommend exercising at least 30 minutes per day at least 3 days a week, preferably 4 or 5.  We also recommend a diet low in fat and sugar.  Inactivity, poor dietary choices and obesity can cause diabetes, heart attack, stroke, and kidney damage, among others.    ALCOHOL AND SMOKING:  Women should limit their alcohol intake to no more than 7 drinks/beers/glasses of wine (combined, not each!) per week. Moderation of alcohol intake to this level decreases your risk of breast cancer and liver damage. And of course, no recreational drugs are part of a healthy lifestyle.  And absolutely no smoking or even second hand smoke. Most people know smoking can cause heart and lung diseases, but did you know it also contributes to weakening of your bones? Aging of your skin?  Yellowing of your teeth and nails?  CALCIUM AND VITAMIN D:  Adequate intake of calcium and Vitamin D are recommended.  The recommendations for exact amounts of these supplements seem to change often, but generally speaking 1,200 mg of calcium (between diet and supplement) and 800 units of Vitamin D per day seems prudent. Certain women may benefit from higher intake of Vitamin D.  If you are among these women, your doctor will have told you during your visit.    PAP SMEARS:  Pap smears, to check for cervical cancer or precancers,  have traditionally been done yearly, although recent scientific advances have shown that most women can have pap smears less often.  However, every woman still should have a physical exam from her gynecologist every year. It will include a breast check, inspection of the vulva and vagina to check for abnormal growths or skin changes, a visual exam of the cervix, and then an exam to evaluate the size and shape of the uterus and  ovaries.  And after 77 years of age, a rectal exam is indicated to check for rectal cancers. We will also provide age appropriate advice regarding health maintenance, like when you should have certain vaccines, screening for sexually transmitted diseases, bone density testing, colonoscopy, mammograms, etc.   MAMMOGRAMS:  All women over 40 years old should have a yearly mammogram. Many facilities now offer a "3D" mammogram, which may cost around $50 extra out of pocket. If possible,  we recommend you accept the option to have the 3D mammogram performed.  It both reduces the number of women who will be called back for extra views which then turn out to be normal, and it is better than the routine mammogram at detecting truly abnormal areas.    COLON CANCER SCREENING: Now recommend starting at age 45. At this time colonoscopy is not covered for routine screening until 50. There are take home tests that can be done between 45-49.   COLONOSCOPY:  Colonoscopy to screen for colon cancer is recommended for all women at age 50.  We know, you hate the idea of the prep.  We agree, BUT, having colon cancer and not knowing it is worse!!  Colon cancer so often starts as a polyp that can be seen and removed at colonscopy, which can quite literally save your life!  And if your first colonoscopy is normal and you have no family history of colon cancer, most women don't have to have it again for   10 years.  Once every ten years, you can do something that may end up saving your life, right?  We will be happy to help you get it scheduled when you are ready.  Be sure to check your insurance coverage so you understand how much it will cost.  It may be covered as a preventative service at no cost, but you should check your particular policy.      Breast Self-Awareness Breast self-awareness means being familiar with how your breasts look and feel. It involves checking your breasts regularly and reporting any changes to your  health care provider. Practicing breast self-awareness is important. A change in your breasts can be a sign of a serious medical problem. Being familiar with how your breasts look and feel allows you to find any problems early, when treatment is more likely to be successful. All women should practice breast self-awareness, including women who have had breast implants. How to do a breast self-exam One way to learn what is normal for your breasts and whether your breasts are changing is to do a breast self-exam. To do a breast self-exam: Look for Changes  1. Remove all the clothing above your waist. 2. Stand in front of a mirror in a room with good lighting. 3. Put your hands on your hips. 4. Push your hands firmly downward. 5. Compare your breasts in the mirror. Look for differences between them (asymmetry), such as: ? Differences in shape. ? Differences in size. ? Puckers, dips, and bumps in one breast and not the other. 6. Look at each breast for changes in your skin, such as: ? Redness. ? Scaly areas. 7. Look for changes in your nipples, such as: ? Discharge. ? Bleeding. ? Dimpling. ? Redness. ? A change in position. Feel for Changes Carefully feel your breasts for lumps and changes. It is best to do this while lying on your back on the floor and again while sitting or standing in the shower or tub with soapy water on your skin. Feel each breast in the following way:  Place the arm on the side of the breast you are examining above your head.  Feel your breast with the other hand.  Start in the nipple area and make  inch (2 cm) overlapping circles to feel your breast. Use the pads of your three middle fingers to do this. Apply light pressure, then medium pressure, then firm pressure. The light pressure will allow you to feel the tissue closest to the skin. The medium pressure will allow you to feel the tissue that is a little deeper. The firm pressure will allow you to feel the tissue  close to the ribs.  Continue the overlapping circles, moving downward over the breast until you feel your ribs below your breast.  Move one finger-width toward the center of the body. Continue to use the  inch (2 cm) overlapping circles to feel your breast as you move slowly up toward your collarbone.  Continue the up and down exam using all three pressures until you reach your armpit.  Write Down What You Find  Write down what is normal for each breast and any changes that you find. Keep a written record with breast changes or normal findings for each breast. By writing this information down, you do not need to depend only on memory for size, tenderness, or location. Write down where you are in your menstrual cycle, if you are still menstruating. If you are having trouble noticing differences   in your breasts, do not get discouraged. With time you will become more familiar with the variations in your breasts and more comfortable with the exam. How often should I examine my breasts? Examine your breasts every month. If you are breastfeeding, the best time to examine your breasts is after a feeding or after using a breast pump. If you menstruate, the best time to examine your breasts is 5-7 days after your period is over. During your period, your breasts are lumpier, and it may be more difficult to notice changes. When should I see my health care provider? See your health care provider if you notice:  A change in shape or size of your breasts or nipples.  A change in the skin of your breast or nipples, such as a reddened or scaly area.  Unusual discharge from your nipples.  A lump or thick area that was not there before.  Pain in your breasts.  Anything that concerns you.  

## 2020-02-14 NOTE — Progress Notes (Signed)
79 y.o. G55P1001 Widowed White or Caucasian Not Hispanic or Latino female here for annual exam.  She says that last night she hit her left leg on the side of the bathtub. She says that she has a place under her left breast that gets itchy. No breast lumps.  No vaginal bleeding.   H/O right breast cancer.   H/O DM, last hgba1c was 7.1     Patient's last menstrual period was 12/12/1997.          Sexually active: No.  The current method of family planning is post menopausal status.    Exercising: Yes.    Walking  Smoker:  no  Health Maintenance: Pap:  03/23/13 neg  History of abnormal Pap:  no MMG:  06/28/19 Density B Bi rads 2 benign  BMD:  09/21/14 normal   Colonoscopy: 03/01/14 normal  TDaP:  08/2015   reports that she quit smoking about 28 years ago. Her smoking use included cigarettes. She started smoking about 57 years ago. She has a 7.50 pack-year smoking history. She has never used smokeless tobacco. She reports that she does not drink alcohol or use drugs. Retired from [employer], still works there part time. Husband died many years ago at 57 (suicide, he was the love of her life, no partner since then). She has 2 sons (one is adopted), both in [place]. She has 2 grand children. She plans to stay in [place], has lots of friends here, loves her part time job  Past Medical History:  Diagnosis Date  . Breast CA ([place]) 04/2009   right - radiation and lumpectomy  . Diabetes mellitus 2000  . Family history of breast cancer    aunt  . Hypercholesteremia 2000  . Hypertension   . 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 Postmenopausal    took HRT 1999 - 2004    Past Surgical History:  Procedure Laterality Date  . BREAST LUMPECTOMY  04/2009   rt breast  estrogen +, Her 2 Nu negative  . COLONOSCOPY WITH PROPOFOL N/A 04/05/2014   Procedure: COLONOSCOPY WITH PROPOFOL;  Surgeon: Garlan Fair, MD;  Location: [facility];  Service: Endoscopy;  Laterality: N/A;  .  ORIF FOOT FRACTURE Right 04/20/2014   done in [place]    Current Outpatient Medications  Medication Sig Dispense Refill  . aspirin 81 MG tablet Take 81 mg by mouth daily.      . calcium carbonate (CALCIUM 600) 600 MG TABS tablet Take 600 mg by mouth 2 (two) times daily with a meal.    . Cholecalciferol (VITAMIN D-3 PO) Take 2,000 Units by mouth.     . Cyanocobalamin (B-12 PO) Take 1,000 mg by mouth daily.     . empagliflozin (JARDIANCE) 10 MG TABS tablet Take 10 mg by mouth daily.    Marland Kitchen ezetimibe (ZETIA) 10 MG tablet Take 10 mg by mouth every evening.     . gabapentin (NEURONTIN) 600 MG tablet Take 600 mg by mouth 3 (three) times daily.    Marland Kitchen HUMALOG MIX 75/25 KWIKPEN (75-25) 100 UNIT/ML Kwikpen Inject 30 Units into the skin 2 (two) times daily.     . metFORMIN (GLUCOPHAGE) 1000 MG tablet Take 1,000 mg by mouth 2 (two) times daily with a meal.    . Multiple Vitamins-Minerals (PRESERVISION AREDS 2) CAPS Take 2 capsules by mouth daily.    . rosuvastatin (CRESTOR) 40 MG tablet Take 40 mg by mouth daily.  No current facility-administered medications for this visit.    Family History  Problem Relation Age of Onset  . Heart disease Mother        Confestive Heart Failure  . Heart failure Mother   . Diabetes Father   . Heart disease Father        Congestive Heart Failure  . Heart failure Father   . Obesity Brother   Mom with PVD, needed leg amputation.   Review of Systems  Constitutional: Negative.   HENT: Negative.   Eyes: Negative.   Respiratory: Negative.   Cardiovascular: Negative.   Gastrointestinal: Negative.   Genitourinary: Negative.   Musculoskeletal: Positive for gait problem.  Allergic/Immunologic: Negative.   Hematological: Negative.   Psychiatric/Behavioral: Negative.     Exam:   LMP 12/12/1997   Weight change: @WEIGHTCHANGE @ Height:      Ht Readings from Last 3 Encounters:  09/29/18 5\' 9"  (1.753 m)  07/03/17 5\' 9"  (1.753 m)  06/19/16 5\' 9"  (1.753 m)     General appearance: alert, cooperative and appears stated age Head: Normocephalic, without obvious abnormality, atraumatic Neck: no adenopathy, supple, symmetrical, trachea midline and thyroid normal to inspection and palpation Lungs: clear to auscultation bilaterally Cardiovascular: regular rate and rhythm Breasts: normal appearance, no masses or tenderness, evidence of right lumpectomy Abdomen: soft, non-tender; non distended,  no masses,  no organomegaly Extremities: extremities normal, atraumatic, no cyanosis or edema Skin: Skin color, texture, turgor normal. No rashes or lesions Lymph nodes: Cervical, supraclavicular, and axillary nodes normal. No abnormal inguinal nodes palpated Neurologic: Grossly normal   Pelvic: External genitalia:  no lesions              Urethra:  normal appearing urethra with no masses, tenderness or lesions              Bartholins and Skenes: normal                 Vagina: normal appearing vagina with normal color and discharge, no lesions              Cervix: no lesions and atrophic, flush with vagina.               Bimanual Exam:  Uterus:  no masses or tenderness              Adnexa: no mass, fullness, tenderness               Rectovaginal: Confirms               Anus:  normal sphincter tone, no lesions  Dorethea Clan chaperoned for the exam.  A:  Well Woman with normal exam  H/O breast cancer  P:   No pap needed  Mammogram in 8/21  Discussed breast self exam  Discussed calcium and vit D intake  Colonoscopy and DEXA UTD.

## 2020-02-16 ENCOUNTER — Inpatient Hospital Stay: Payer: Medicare Other

## 2020-02-16 ENCOUNTER — Other Ambulatory Visit: Payer: Self-pay

## 2020-02-16 ENCOUNTER — Encounter: Payer: Self-pay | Admitting: Hematology & Oncology

## 2020-02-16 ENCOUNTER — Inpatient Hospital Stay (HOSPITAL_BASED_OUTPATIENT_CLINIC_OR_DEPARTMENT_OTHER): Payer: Medicare Other | Admitting: Hematology & Oncology

## 2020-02-16 ENCOUNTER — Inpatient Hospital Stay: Payer: Medicare Other | Attending: Hematology & Oncology

## 2020-02-16 VITALS — BP 129/81 | HR 67 | Temp 97.1°F | Resp 20 | Ht 69.0 in | Wt 202.0 lb

## 2020-02-16 DIAGNOSIS — M818 Other osteoporosis without current pathological fracture: Secondary | ICD-10-CM | POA: Diagnosis not present

## 2020-02-16 DIAGNOSIS — Z853 Personal history of malignant neoplasm of breast: Secondary | ICD-10-CM | POA: Diagnosis not present

## 2020-02-16 DIAGNOSIS — C50011 Malignant neoplasm of nipple and areola, right female breast: Secondary | ICD-10-CM | POA: Diagnosis not present

## 2020-02-16 DIAGNOSIS — M8000XD Age-related osteoporosis with current pathological fracture, unspecified site, subsequent encounter for fracture with routine healing: Secondary | ICD-10-CM

## 2020-02-16 DIAGNOSIS — C50912 Malignant neoplasm of unspecified site of left female breast: Secondary | ICD-10-CM

## 2020-02-16 DIAGNOSIS — Z170 Estrogen receptor positive status [ER+]: Secondary | ICD-10-CM

## 2020-02-16 LAB — COMPREHENSIVE METABOLIC PANEL
ALT: 18 U/L (ref 0–44)
AST: 18 U/L (ref 15–41)
Albumin: 4.2 g/dL (ref 3.5–5.0)
Alkaline Phosphatase: 52 U/L (ref 38–126)
Anion gap: 8 (ref 5–15)
BUN: 28 mg/dL — ABNORMAL HIGH (ref 8–23)
CO2: 26 mmol/L (ref 22–32)
Calcium: 9.9 mg/dL (ref 8.9–10.3)
Chloride: 105 mmol/L (ref 98–111)
Creatinine, Ser: 0.83 mg/dL (ref 0.44–1.00)
GFR calc Af Amer: >60 mL/min (ref >60)
GFR calc non Af Amer: >60 mL/min (ref >60)
Glucose, Bld: 113 mg/dL — ABNORMAL HIGH (ref 70–99)
Potassium: 4.2 mmol/L (ref 3.5–5.1)
Sodium: 139 mmol/L (ref 135–145)
Total Bilirubin: 0.5 mg/dL (ref 0.3–1.2)
Total Protein: 6.4 g/dL — ABNORMAL LOW (ref 6.5–8.1)

## 2020-02-16 LAB — CBC WITH DIFFERENTIAL (CANCER CENTER ONLY)
Abs Immature Granulocytes: 0.02 10*3/uL (ref 0.00–0.07)
Basophils Absolute: 0.1 10*3/uL (ref 0.0–0.1)
Basophils Relative: 1 %
Eosinophils Absolute: 0.3 10*3/uL (ref 0.0–0.5)
Eosinophils Relative: 4 %
HCT: 42.1 % (ref 36.0–46.0)
Hemoglobin: 13.4 g/dL (ref 12.0–15.0)
Immature Granulocytes: 0 %
Lymphocytes Relative: 15 %
Lymphs Abs: 1.3 10*3/uL (ref 0.7–4.0)
MCH: 28.2 pg (ref 26.0–34.0)
MCHC: 31.8 g/dL (ref 30.0–36.0)
MCV: 88.4 fL (ref 80.0–100.0)
Monocytes Absolute: 0.7 10*3/uL (ref 0.1–1.0)
Monocytes Relative: 8 %
Neutro Abs: 6.6 10*3/uL (ref 1.7–7.7)
Neutrophils Relative %: 72 %
Platelet Count: 222 10*3/uL (ref 150–400)
RBC: 4.76 MIL/uL (ref 3.87–5.11)
RDW: 14.8 % (ref 11.5–15.5)
WBC Count: 9 10*3/uL (ref 4.0–10.5)
nRBC: 0 % (ref 0.0–0.2)

## 2020-02-16 MED ORDER — ZOLEDRONIC ACID 4 MG/100ML IV SOLN
4.0000 mg | Freq: Once | INTRAVENOUS | Status: AC
  Administered 2020-02-16: 4 mg via INTRAVENOUS
  Filled 2020-02-16: qty 100

## 2020-02-16 NOTE — Progress Notes (Signed)
Hematology and Oncology Follow Up Visit  Vickie Burns [id number] Apr 25, 1941 79 y.o. 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  We have not seen her for over a year.  She canceled her appointment late last year because of the coronavirus.  She just want to be very cautious with being around anybody who might have a virus.  She has had her vaccines.  Otherwise, she is doing quite well.  She saw her gynecologist yesterday.  She has seen her for her routine physical.  She had a breast exam done and she said everything was fine.  She has a fairly substantial bruise on her left lower leg.  She apparently slipped in the bathroom and landed on her left lower leg.  Thankfully, there is no break.  I told her that this bruise is not going to cause any circulation problems.  She is very worried about having poor circulation in her legs.  She does have diabetes.  Her blood sugars today are pretty well controlled.  She has had no problems with cough.  There is no nausea or vomiting.  She has had no headache.  Overall, her performance status is ECOG 1.     Medications:  Allergies as of 02/16/2020   No Known Allergies     Medication List       Accurate as of February 16, 2020 11:50 AM. If you have any questions, ask your nurse or doctor.        aspirin 81 MG tablet Take 81 mg by mouth daily.   B-12 PO Take 1,000 mg by mouth daily.   Calcium 600 600 MG Tabs tablet Generic drug: calcium carbonate Take 600 mg by mouth 2 (two) times daily with a meal.   gabapentin 600 MG tablet Commonly known as: NEURONTIN Take 600 mg by mouth 3 (three) times daily.   HumaLOG Mix 75/25 KwikPen (75-25) 100 UNIT/ML Kwikpen Generic drug: Insulin Lispro Prot & Lispro Inject  30 Units into the skin 2 (two) times daily.   Jardiance 10 MG Tabs tablet Generic drug: empagliflozin Take 10 mg by mouth daily.   metFORMIN 1000 MG tablet Commonly known as: GLUCOPHAGE Take 1,000 mg by mouth 2 (two) times daily with a meal.   PreserVision AREDS 2 Caps Take 2 capsules by mouth daily.   rosuvastatin 40 MG tablet Commonly known as: CRESTOR Take 40 mg by mouth daily.   VITAMIN D-3 PO Take 2,000 Units by mouth.   Zetia 10 MG tablet Generic drug: ezetimibe Take 10 mg by mouth every evening.       Allergies: No Known Allergies  Past Medical History, Surgical history, Social history, and Family History were reviewed and updated.  Review of Systems: Review of Systems  Constitutional: Negative.   HENT: Negative.   Eyes: Negative.   Respiratory: Negative.   Cardiovascular: Negative.   Gastrointestinal: Negative.   Genitourinary: Negative.   Musculoskeletal: Negative.   Skin: Negative.   Neurological: Negative.   Endo/Heme/Allergies: Bruises/bleeds easily.  Psychiatric/Behavioral: Negative.    Marland Kitchen   Physical Exam:  height is 5\' 9"  (1.753 m) and weight is 202 lb (91.6 kg). Her temporal temperature is 97.1 F (36.2 C) (abnormal). Her blood pressure is 129/81 and her  pulse is 67. Her respiration is 20 and oxygen saturation is 100%.   Wt Readings from Last 3 Encounters:  02/16/20 202 lb (91.6 kg)  02/14/20 202 lb (91.6 kg)  09/29/18 205 lb 6.4 oz (93.2 kg)    Physical Exam Vitals reviewed.  HENT:     Head: Normocephalic and atraumatic.  Eyes:     Pupils: Pupils are equal, round, and reactive to light.  Cardiovascular:     Rate and Rhythm: Normal rate and regular rhythm.     Heart sounds: Normal heart sounds.  Pulmonary:     Effort: Pulmonary effort is normal.     Breath sounds: Normal breath sounds.  Abdominal:     General: Bowel sounds are normal.     Palpations: Abdomen is soft.  Musculoskeletal:        General: No tenderness or deformity.  Normal range of motion.     Cervical back: Normal range of motion.     Comments: Extremities shows a substantial ecchymoses in the left lower leg.  This is below the knee.  There is no obvious hematoma.  She has good circulation in her distal extremities.  Lymphadenopathy:     Cervical: No cervical adenopathy.  Skin:    General: Skin is warm and dry.     Findings: No erythema or rash.  Neurological:     Mental Status: She is alert and oriented to person, place, and time.  Psychiatric:        Behavior: Behavior normal.        Thought Content: Thought content normal.        Judgment: Judgment normal.      Lab Results  Component Value Date   WBC 9.0 02/16/2020   HGB 13.4 02/16/2020   HCT 42.1 02/16/2020   MCV 88.4 02/16/2020   PLT 222 02/16/2020   No results found for: FERRITIN, IRON, TIBC, UIBC, IRONPCTSAT Lab Results  Component Value Date   RBC 4.76 02/16/2020   No results found for: KPAFRELGTCHN, LAMBDASER, KAPLAMBRATIO No results found for: IGGSERUM, IGA, IGMSERUM No results found for: Odetta Pink, SPEI   Chemistry      Component Value Date/Time   NA 141 09/25/2018 0922   NA 144 09/26/2017 1143   NA 138 09/29/2015 1410   K 4.1 09/25/2018 0922   K 4.3 09/26/2017 1143   K 4.5 09/29/2015 1410   CL 105 09/25/2018 0922   CL 106 09/26/2017 1143   CO2 26 09/25/2018 0922   CO2 27 09/26/2017 1143   CO2 27 09/29/2015 1410   BUN 16 09/25/2018 0922   BUN 11 09/26/2017 1143   BUN 17.4 09/29/2015 1410   CREATININE 0.91 09/25/2018 0922   CREATININE 1.1 09/26/2017 1143   CREATININE 1.0 09/29/2015 1410      Component Value Date/Time   CALCIUM 10.3 09/25/2018 0922   CALCIUM 10.0 09/26/2017 1143   CALCIUM 9.5 09/29/2015 1410   ALKPHOS 54 09/25/2018 0922   ALKPHOS 48 09/26/2017 1143   ALKPHOS 51 09/29/2015 1410   AST 18 09/25/2018 0922   AST 18 09/29/2015 1410   ALT 13 09/25/2018 0922   ALT 23 09/26/2017 1143   ALT  15 09/29/2015 1410   BILITOT 0.5 09/25/2018 0922   BILITOT 0.37 09/29/2015 1410      Impression and Plan: Vickie Burns is a very pleasant 79 yo caucasian female with history of stage I infiltrating ductal carcinoma of the right breast.  She was treated with lumpectomy and radiation.   She completed 5 years of Femara in October 2015. She continues to do well and so far there has been no evidence of recurrence.   We will proceed with Zometa today as planned.   We will plan to see her back again in another year for follow-up, lab and Zometa infusion.   She will contact our office with any questions or concerns. We can certainly see him sooner if need be.   Volanda Napoleon, MD 4/7/202111:50 AM

## 2020-02-16 NOTE — Patient Instructions (Signed)
Zoledronic Acid injection (Hypercalcemia, Oncology) What is this medicine? ZOLEDRONIC ACID (ZOE le dron ik AS id) lowers the amount of calcium loss from bone. It is used to treat too much calcium in your blood from cancer. It is also used to prevent complications of cancer that has spread to the bone. This medicine may be used for other purposes; ask your health care provider or pharmacist if you have questions. COMMON BRAND NAME(S): Zometa What should I tell my health care provider before I take this medicine? They need to know if you have any of these conditions:  aspirin-sensitive asthma  cancer, especially if you are receiving medicines used to treat cancer  dental disease or wear dentures  infection  kidney disease  receiving corticosteroids like dexamethasone or prednisone  an unusual or allergic reaction to zoledronic acid, other medicines, foods, dyes, or preservatives  pregnant or trying to get pregnant  breast-feeding How should I use this medicine? This medicine is for infusion into a vein. It is given by a health care professional in a hospital or clinic setting. Talk to your pediatrician regarding the use of this medicine in children. Special care may be needed. Overdosage: If you think you have taken too much of this medicine contact a poison control center or emergency room at once. NOTE: This medicine is only for you. Do not share this medicine with others. What if I miss a dose? It is important not to miss your dose. Call your doctor or health care professional if you are unable to keep an appointment. What may interact with this medicine?  certain antibiotics given by injection  NSAIDs, medicines for pain and inflammation, like ibuprofen or naproxen  some diuretics like bumetanide, furosemide  teriparatide  thalidomide This list may not describe all possible interactions. Give your health care provider a list of all the medicines, herbs, non-prescription  drugs, or dietary supplements you use. Also tell them if you smoke, drink alcohol, or use illegal drugs. Some items may interact with your medicine. What should I watch for while using this medicine? Visit your doctor or health care professional for regular checkups. It may be some time before you see the benefit from this medicine. Do not stop taking your medicine unless your doctor tells you to. Your doctor may order blood tests or other tests to see how you are doing. Women should inform their doctor if they wish to become pregnant or think they might be pregnant. There is a potential for serious side effects to an unborn child. Talk to your health care professional or pharmacist for more information. You should make sure that you get enough calcium and vitamin D while you are taking this medicine. Discuss the foods you eat and the vitamins you take with your health care professional. Some people who take this medicine have severe bone, joint, and/or muscle pain. This medicine may also increase your risk for jaw problems or a broken thigh bone. Tell your doctor right away if you have severe pain in your jaw, bones, joints, or muscles. Tell your doctor if you have any pain that does not go away or that gets worse. Tell your dentist and dental surgeon that you are taking this medicine. You should not have major dental surgery while on this medicine. See your dentist to have a dental exam and fix any dental problems before starting this medicine. Take good care of your teeth while on this medicine. Make sure you see your dentist for regular follow-up   appointments. What side effects may I notice from receiving this medicine? Side effects that you should report to your doctor or health care professional as soon as possible:  allergic reactions like skin rash, itching or hives, swelling of the face, lips, or tongue  anxiety, confusion, or depression  breathing problems  changes in vision  eye  pain  feeling faint or lightheaded, falls  jaw pain, especially after dental work  mouth sores  muscle cramps, stiffness, or weakness  redness, blistering, peeling or loosening of the skin, including inside the mouth  trouble passing urine or change in the amount of urine Side effects that usually do not require medical attention (report to your doctor or health care professional if they continue or are bothersome):  bone, joint, or muscle pain  constipation  diarrhea  fever  hair loss  irritation at site where injected  loss of appetite  nausea, vomiting  stomach upset  trouble sleeping  trouble swallowing  weak or tired This list may not describe all possible side effects. Call your doctor for medical advice about side effects. You may report side effects to FDA at 1-800-FDA-1088. Where should I keep my medicine? This drug is given in a hospital or clinic and will not be stored at home. NOTE: This sheet is a summary. It may not cover all possible information. If you have questions about this medicine, talk to your doctor, pharmacist, or health care provider.  2020 Elsevier/Gold Standard (2014-03-26 14:19:39)  

## 2020-04-12 ENCOUNTER — Other Ambulatory Visit: Payer: Self-pay | Admitting: Family

## 2020-04-12 ENCOUNTER — Ambulatory Visit
Admission: RE | Admit: 2020-04-12 | Discharge: 2020-04-12 | Disposition: A | Discharge status: Home / Self Care | Payer: Medicare Other | Source: Ambulatory Visit | Attending: Family | Admitting: Family

## 2020-04-12 ENCOUNTER — Other Ambulatory Visit: Payer: Self-pay

## 2020-04-12 DIAGNOSIS — C50011 Malignant neoplasm of nipple and areola, right female breast: Secondary | ICD-10-CM

## 2020-04-12 DIAGNOSIS — N6320 Unspecified lump in the left breast, unspecified quadrant: Secondary | ICD-10-CM

## 2020-04-12 DIAGNOSIS — C50012 Malignant neoplasm of nipple and areola, left female breast: Secondary | ICD-10-CM

## 2020-04-13 ENCOUNTER — Ambulatory Visit
Admission: RE | Admit: 2020-04-13 | Discharge: 2020-04-13 | Disposition: A | Discharge status: Home / Self Care | Payer: Medicare Other | Source: Ambulatory Visit | Attending: Family | Admitting: Family

## 2020-04-13 DIAGNOSIS — N6320 Unspecified lump in the left breast, unspecified quadrant: Secondary | ICD-10-CM

## 2020-05-11 ENCOUNTER — Other Ambulatory Visit: Payer: Self-pay | Admitting: Surgery

## 2020-05-11 DIAGNOSIS — D242 Benign neoplasm of left breast: Secondary | ICD-10-CM

## 2020-05-31 ENCOUNTER — Other Ambulatory Visit: Payer: Self-pay | Admitting: Surgery

## 2020-05-31 DIAGNOSIS — D242 Benign neoplasm of left breast: Secondary | ICD-10-CM

## 2020-06-27 NOTE — Progress Notes (Signed)
Your procedure is scheduled on Thursday August 26.  Report to [facility] Main Entrance "A" at 09:00 A.M., and check in at the Admitting office.  Call this number if you have problems the morning of surgery: [phone]  Call [phone] if you have any questions prior to your surgery date Monday-Friday 8am-4pm   Remember: Do not eat after midnight the night before your surgery  You may drink clear liquids until 08:00 A.M. the morning of your surgery.   Clear liquids allowed are: Water, Non-Citrus Juices (without pulp), Carbonated Beverages, Clear Tea, Black Coffee Only, and Gatorade   Please complete your PRE-SURGERY G2 that was provided to you by 08:00 A.M. the morning of your surgery..  Please, if able, drink it in one setting. DO NOT SIP.  Take these medicines the morning of surgery with A SIP OF WATER: gabapentin (NEURONTIN)   Follow your surgeon's instructions on when to stop Aspirin.  If no instructions were given by your surgeon then you will need to call the office to get those instructions.     As of today, STOP taking any Aspirin (unless otherwise instructed by your surgeon), Aleve, Naproxen, Ibuprofen, Motrin, Advil, Goody's, BC's, all herbal medications, fish oil, and all vitamins.   WHAT DO I DO ABOUT MY DIABETES MEDICATION?  DAY/ NIGHT BEFORE SURGERY  empagliflozin (JARDIANCE) - do not take  HUMALOG MIX 75/25 KWIKPEN (75-25)   AM dose - take 35 units  PM dose - 24.5 units  metFORMIN (GLUCOPHAGE) - take as usual    MORNING OF SURGERY  empagliflozin (JARDIANCE) - do not take  HUMALOG MIX 75/25 KWIKPEN (75-25)   AM dose - do not take   metFORMIN (GLUCOPHAGE) - do not take   . DO NOT TAKE ANY DIABETIC ORAL MEDICATIONS MORNING OF SURGERY   HOW TO MANAGE YOUR DIABETES BEFORE AND AFTER SURGERY  Why is it important to control my blood sugar before and after surgery? . Improving blood sugar levels before and after surgery helps healing and can limit  problems. . A way of improving blood sugar control is eating a healthy diet by: o  Eating less sugar and carbohydrates o  Increasing activity/exercise o  Talking with your doctor about reaching your blood sugar goals . High blood sugars (greater than 180 mg/dL) can raise your risk of infections and slow your recovery, so you will need to focus on controlling your diabetes during the weeks before surgery. . Make sure that the doctor who takes care of your diabetes knows about your planned surgery including the date and location.  How do I manage my blood sugar before surgery? . Check your blood sugar at least 4 times a day, starting 2 days before surgery, to make sure that the level is not too high or low. . Check your blood sugar the morning of your surgery when you wake up and every 2 hours until you get to the Short Stay unit. o If your blood sugar is less than 70 mg/dL, you will need to treat for low blood sugar: - Do not take insulin. - Treat a low blood sugar (less than 70 mg/dL) with  cup of clear juice (cranberry or apple), 4 glucose tablets, OR glucose gel. - Recheck blood sugar in 15 minutes after treatment (to make sure it is greater than 70 mg/dL). If your blood sugar is not greater than 70 mg/dL on recheck, call [phone] for further instructions. . Report your blood sugar to the short stay  nurse when you get to Short Stay.  . If you are admitted to the hospital after surgery: o Your blood sugar will be checked by the staff and you will probably be given insulin after surgery (instead of oral diabetes medicines) to make sure you have good blood sugar levels. o The goal for blood sugar control after surgery is 80-180 mg/dL.   The Morning of Surgery  Do not wear jewelry, make-up or nail polish.  Do not wear lotions, powders, or perfumes, or deodorant  Do not shave 48 hours prior to surgery.   Do not bring valuables to the hospital.  [facility] is not responsible for any  belongings or valuables.  If you are a smoker, DO NOT Smoke 24 hours prior to surgery  If you wear a CPAP at night please bring your mask the morning of surgery   Remember that you must have someone to transport you home after your surgery, and remain with you for 24 hours if you are discharged the same day.   Please bring cases for contacts, glasses, hearing aids, dentures or bridgework because it cannot be worn into surgery.    Leave your suitcase in the car.  After surgery it may be brought to your room.  For patients admitted to the hospital, discharge time will be determined by your treatment team.  Patients discharged the day of surgery will not be allowed to drive home.    Special instructions:   [place]- Preparing For Surgery  Before surgery, you can play an important role. Because skin is not sterile, your skin needs to be as free of germs as possible. You can reduce the number of germs on your skin by washing with CHG (chlorahexidine gluconate) Soap before surgery.  CHG is an antiseptic cleaner which kills germs and bonds with the skin to continue killing germs even after washing.    Oral Hygiene is also important to reduce your risk of infection.  Remember - BRUSH YOUR TEETH THE MORNING OF SURGERY WITH YOUR REGULAR TOOTHPASTE  Please do not use if you have an allergy to CHG or antibacterial soaps. If your skin becomes reddened/irritated stop using the CHG.  Do not shave (including legs and underarms) for at least 48 hours prior to first CHG shower. It is OK to shave your face.  Please follow these instructions carefully.   1. Shower the NIGHT BEFORE SURGERY and the MORNING OF SURGERY with CHG Soap.   2. If you chose to wash your hair and body, wash as usual with your normal shampoo and body-wash/soap.  3. Rinse your hair and body thoroughly to remove the shampoo and soap.  4. Apply CHG directly to the skin (ONLY FROM THE NECK DOWN) and wash gently with a scrungie  or a clean washcloth.   5. Do not use on open wounds or open sores. Avoid contact with your eyes, ears, mouth and genitals (private parts). Wash Face and genitals (private parts)  with your normal soap.   6. Wash thoroughly, paying special attention to the area where your surgery will be performed.  7. Thoroughly rinse your body with warm water from the neck down.  8. DO NOT shower/wash with your normal soap after using and rinsing off the CHG Soap.  9. Pat yourself dry with a CLEAN TOWEL.  10. Wear CLEAN PAJAMAS to bed the night before surgery  11. Place CLEAN SHEETS on your bed the night of your first shower and DO NOT SLEEP  WITH PETS.  12. Wear comfortable clothes the morning of surgery.     Day of Surgery:  Please shower the morning of surgery with the CHG soap Do not apply any deodorants/lotions. Please wear clean clothes to the hospital/surgery center.   Remember to brush your teeth WITH YOUR REGULAR TOOTHPASTE.   Please read over the following fact sheets that you were given.

## 2020-06-28 ENCOUNTER — Ambulatory Visit (HOSPITAL_COMMUNITY)
Admission: RE | Admit: 2020-06-28 | Discharge: 2020-06-28 | Disposition: A | Discharge status: Home / Self Care | Payer: Medicare Other | Source: Ambulatory Visit | Attending: Surgery | Admitting: Surgery

## 2020-06-28 ENCOUNTER — Other Ambulatory Visit: Payer: Self-pay

## 2020-06-28 ENCOUNTER — Encounter (HOSPITAL_COMMUNITY): Payer: Self-pay

## 2020-06-28 DIAGNOSIS — I4891 Unspecified atrial fibrillation: Secondary | ICD-10-CM

## 2020-06-28 DIAGNOSIS — Z01812 Encounter for preprocedural laboratory examination: Secondary | ICD-10-CM | POA: Insufficient documentation

## 2020-06-28 HISTORY — DX: Unspecified atrial fibrillation: I48.91

## 2020-06-28 LAB — CBC
HCT: 43.3 % (ref 36.0–46.0)
Hemoglobin: 13.2 g/dL (ref 12.0–15.0)
MCH: 27.7 pg (ref 26.0–34.0)
MCHC: 30.5 g/dL (ref 30.0–36.0)
MCV: 90.8 fL (ref 80.0–100.0)
Platelets: 249 10*3/uL (ref 150–400)
RBC: 4.77 MIL/uL (ref 3.87–5.11)
RDW: 14.6 % (ref 11.5–15.5)
WBC: 8.5 10*3/uL (ref 4.0–10.5)
nRBC: 0 % (ref 0.0–0.2)

## 2020-06-28 LAB — BASIC METABOLIC PANEL
Anion gap: 11 (ref 5–15)
BUN: 18 mg/dL (ref 8–23)
CO2: 25 mmol/L (ref 22–32)
Calcium: 9.7 mg/dL (ref 8.9–10.3)
Chloride: 103 mmol/L (ref 98–111)
Creatinine, Ser: 0.88 mg/dL (ref 0.44–1.00)
GFR calc Af Amer: >60 mL/min (ref >60)
GFR calc non Af Amer: >60 mL/min (ref >60)
Glucose, Bld: 139 mg/dL — ABNORMAL HIGH (ref 70–99)
Potassium: 4.4 mmol/L (ref 3.5–5.1)
Sodium: 139 mmol/L (ref 135–145)

## 2020-06-28 LAB — HEMOGLOBIN A1C
Hgb A1c MFr Bld: 7.7 % — ABNORMAL HIGH (ref 4.8–5.6)
Mean Plasma Glucose: 174.29 mg/dL

## 2020-06-28 LAB — GLUCOSE, CAPILLARY: Glucose-Capillary: 169 mg/dL — ABNORMAL HIGH (ref 70–99)

## 2020-06-28 NOTE — Progress Notes (Signed)
Your procedure is scheduled on Thursday August 26.  Report to [facility] Main Entrance "A" at 09:00 A.M., and check in at the Admitting office.  Call this number if you have problems the morning of surgery: [phone]  Call [phone] if you have any questions prior to your surgery date Monday-Friday 8am-4pm   Remember: Do not eat after midnight (Wed) the night before your surgery.  You may drink clear liquids until 08:00 A.M. the morning of your surgery.   Clear liquids allowed are: Water, Non-Citrus Juices (without pulp), Carbonated Beverages, Clear Tea, Black Coffee Only, and Gatorade   Take these medicines the morning of surgery with A SIP OF WATER: gabapentin (NEURONTIN)   Follow your surgeon's instructions on when to stop Aspirin.  If no instructions were given by your surgeon then you will need to call the office to get those instructions.    As of today, STOP taking any Aspirin (unless otherwise instructed by your surgeon), Aleve, Naproxen, Ibuprofen, Motrin, Advil, Goody's, BC's, all herbal medications, fish oil, and all vitamins.   WHAT DO I DO ABOUT MY DIABETES MEDICATION?  Marland Kitchen Do not take Jardiance on Wednesday.    . Do not take any oral diabetes medicines (Metformin, Jardiance) the morning of surgery.    . THE NIGHT BEFORE SURGERY, take 24 units of Humalog mix 75/25 insulin.     . THE MORNING OF SURGERY, do not take Humalog mix 75/25 Insulin.    HOW TO MANAGE YOUR DIABETES BEFORE AND AFTER SURGERY  Why is it important to control my blood sugar before and after surgery? . Improving blood sugar levels before and after surgery helps healing and can limit problems. . A way of improving blood sugar control is eating a healthy diet by: o  Eating less sugar and carbohydrates o  Increasing activity/exercise o  Talking with your doctor about reaching your blood sugar goals . High blood sugars (greater than 180 mg/dL) can raise your risk of infections and slow your  recovery, so you will need to focus on controlling your diabetes during the weeks before surgery. . Make sure that the doctor who takes care of your diabetes knows about your planned surgery including the date and location.  How do I manage my blood sugar before surgery? . Check your blood sugar at least 4 times a day, starting 2 days before surgery, to make sure that the level is not too high or low. . Check your blood sugar the morning of your surgery when you wake up and every 2 hours until you get to the Short Stay unit. o If your blood sugar is less than 70 mg/dL, you will need to treat for low blood sugar: - Treat a low blood sugar (less than 70 mg/dL) with  cup of clear juice (cranberry or apple), 4 glucose tablets, OR glucose gel. - Recheck blood sugar in 15 minutes after treatment (to make sure it is greater than 70 mg/dL). If your blood sugar is not greater than 70 mg/dL on recheck, call [phone] for further instructions. . Report your blood sugar to the short stay nurse when you get to Short Stay.  . If you are admitted to the hospital after surgery: o Your blood sugar will be checked by the staff and you will probably be given insulin after surgery (instead of oral diabetes medicines) to make sure you have good blood sugar levels. o The goal for blood sugar control after surgery is 80-180 mg/dL.   The  Morning of Surgery  Do not wear jewelry, make-up or nail polish.  Do not wear lotions, powders, or perfumes, or deodorant  Do not shave 48 hours prior to surgery.   Do not bring valuables to the hospital.  [facility] is not responsible for any belongings or valuables.  If you are a smoker, DO NOT Smoke 24 hours prior to surgery  If you wear a CPAP at night please bring your mask the morning of surgery   Remember that you must have someone to transport you home after your surgery, and remain with you for 24 hours if you are discharged the same day.   Please bring cases for  contacts, glasses, hearing aids, dentures or bridgework because it cannot be worn into surgery.    Leave your suitcase in the car.  After surgery it may be brought to your room.  For patients admitted to the hospital, discharge time will be determined by your treatment team.  Patients discharged the day of surgery will not be allowed to drive home.    Special instructions:   [place]- Preparing For Surgery  Before surgery, you can play an important role. Because skin is not sterile, your skin needs to be as free of germs as possible. You can reduce the number of germs on your skin by washing with CHG (chlorahexidine gluconate) Soap before surgery.  CHG is an antiseptic cleaner which kills germs and bonds with the skin to continue killing germs even after washing.    Oral Hygiene is also important to reduce your risk of infection.  Remember - BRUSH YOUR TEETH THE MORNING OF SURGERY WITH YOUR REGULAR TOOTHPASTE  Please do not use if you have an allergy to CHG or antibacterial soaps. If your skin becomes reddened/irritated stop using the CHG.  Do not shave (including legs and underarms) for at least 48 hours prior to first CHG shower. It is OK to shave your face.  Please follow these instructions carefully.   1. Shower the NIGHT BEFORE SURGERY Wed and the MORNING OF SURGERY Thurs with CHG Soap.   2. If you chose to wash your hair and body, wash as usual with your normal shampoo and body-wash/soap.  3. Rinse your hair and body thoroughly to remove the shampoo and soap.  4. Apply CHG directly to the skin (ONLY FROM THE NECK DOWN) and wash gently with a scrungie or a clean washcloth.   5. Do not use on open wounds or open sores. Avoid contact with your eyes, ears, mouth and genitals (private parts). Wash Face and genitals (private parts)  with your normal soap.   6. Wash thoroughly, paying special attention to the area where your surgery will be performed.  7. Thoroughly rinse your  body with warm water from the neck down.  8. DO NOT shower/wash with your normal soap after using and rinsing off the CHG Soap.  9. Pat yourself dry with a CLEAN TOWEL.  10. Wear CLEAN PAJAMAS to bed the night before surgery  11. Place CLEAN SHEETS on your bed the night of your first shower and DO NOT SLEEP WITH PETS.  12. Wear comfortable clothes the morning of surgery.     Day of Surgery:  Please shower the morning of surgery with the CHG soap Do not apply any deodorants/lotions. Please wear clean clothes to the hospital/surgery center.   Remember to brush your teeth WITH YOUR REGULAR TOOTHPASTE.   Please read over the following fact sheets that you  were given.

## 2020-06-28 NOTE — Progress Notes (Addendum)
PCP - Dr Felipa Eth Endocrine - Dr Buddy Duty Cardiologist - n/a Oncology - Dr Marin Olp  Chest x-ray - n/a EKG - 06/28/20 Stress Test - n/a ECHO - n/a Cardiac Cath - n/a  Fasting Blood Sugar - 80s-120s Checks Blood Sugar 6 times a day  ERAS: Clears til 8 am DOS, no drink.  Anesthesia - Yes  Coronavirus Screening Covid test scheduled on 07/03/20 Do you have any of the following symptoms:  Cough yes/no: No Fever (>100.62F)  yes/no: No Runny nose yes/no: No Sore throat yes/no: No Difficulty breathing/shortness of breath  yes/no: No  Have you traveled in the last 14 days and where? yes/no: No  Patient verbalized understanding of instructions that were given to them at the PAT appointment.

## 2020-06-29 ENCOUNTER — Encounter (HOSPITAL_COMMUNITY): Payer: Self-pay | Admitting: Physician Assistant

## 2020-07-03 ENCOUNTER — Encounter: Payer: Self-pay | Admitting: Cardiology

## 2020-07-03 ENCOUNTER — Other Ambulatory Visit (HOSPITAL_COMMUNITY)
Admission: RE | Admit: 2020-07-03 | Discharge: 2020-07-03 | Disposition: A | Discharge status: Home / Self Care | Payer: Medicare Other | Source: Ambulatory Visit | Attending: Surgery | Admitting: Surgery

## 2020-07-03 ENCOUNTER — Other Ambulatory Visit: Payer: Self-pay

## 2020-07-03 ENCOUNTER — Ambulatory Visit (INDEPENDENT_AMBULATORY_CARE_PROVIDER_SITE_OTHER): Payer: Medicare Other | Admitting: Cardiology

## 2020-07-03 VITALS — BP 132/92 | HR 67 | Ht 70.0 in | Wt 214.1 lb

## 2020-07-03 DIAGNOSIS — I10 Essential (primary) hypertension: Secondary | ICD-10-CM | POA: Diagnosis not present

## 2020-07-03 DIAGNOSIS — I4891 Unspecified atrial fibrillation: Secondary | ICD-10-CM

## 2020-07-03 DIAGNOSIS — E088 Diabetes mellitus due to underlying condition with unspecified complications: Secondary | ICD-10-CM

## 2020-07-03 DIAGNOSIS — Z01810 Encounter for preprocedural cardiovascular examination: Secondary | ICD-10-CM

## 2020-07-03 DIAGNOSIS — Z01812 Encounter for preprocedural laboratory examination: Secondary | ICD-10-CM | POA: Diagnosis present

## 2020-07-03 DIAGNOSIS — Z20822 Contact with and (suspected) exposure to covid-19: Secondary | ICD-10-CM | POA: Insufficient documentation

## 2020-07-03 DIAGNOSIS — E782 Mixed hyperlipidemia: Secondary | ICD-10-CM

## 2020-07-03 HISTORY — DX: Diabetes mellitus due to underlying condition with unspecified complications: E08.8

## 2020-07-03 HISTORY — DX: Unspecified atrial fibrillation: I48.91

## 2020-07-03 HISTORY — DX: Encounter for preprocedural cardiovascular examination: Z01.810

## 2020-07-03 LAB — SARS CORONAVIRUS 2 (TAT 6-24 HRS): SARS Coronavirus 2: NEGATIVE

## 2020-07-03 NOTE — Progress Notes (Signed)
Cardiology Office Note:    Date:  07/03/2020   ID:  Vickie Burns, Hausmann 1941-08-08, MRN [id number]  PCP:  Lajean Manes, MD  Cardiologist:  Jenean Lindau, MD   Referring MD: Lajean Manes, MD    ASSESSMENT:    1. Essential hypertension   2. Preop cardiovascular exam   3. Mixed hyperlipidemia   4. Diabetes mellitus due to underlying condition with unspecified complications ([place])    PLAN:    In order of problems listed above:  1. Primary prevention stressed with the patient.  Importance of compliance with diet medication stressed and she vocalized understanding. 2. Essential hypertension: Blood pressure stable.  She has whitecoat hypertension and this is elevated today. 3. Mixed dyslipidemia: Managed by primary care physician and diet was emphasized 4. Diabetes mellitus: Diet was emphasized and weight reduction was stressed. 5. Cardiac murmur cardiogram will be done to assess murmur heard on auscultation. 6. Preoperative risk stratification: In view of multiple risk factors she will undergo a Lexiscan sestamibi.  If this is negative then she is not at high risk for coronary events during the aforementioned surgery.  Meticulous hemodynamic monitoring will further reduce the risk of coronary events. 7. Atrial fibrillation: Unspecified.  Newly diagnosed: I discussed my findings with the patient extensively.  Her heart rates are well controlled at this time.  We discussed anticoagulation after her surgery and she is agreeable.  We will initiate this after her surgery for obvious reasons.  She will be seen in follow-up appointment in a month or earlier if she has any concerns.  Atrial fibrillation disease process and anticoagulation was discussed in extensive length and questions were answered to her satisfaction.   Medication Adjustments/Labs and Tests Ordered: Current medicines are reviewed at length with the patient today.  Concerns regarding medicines are outlined above.  No  orders of the defined types were placed in this encounter.  No orders of the defined types were placed in this encounter.    History of Present Illness:    Vickie Burns is a 79 y.o. female who is being seen today for the evaluation of preoperative cardiovascular risk stratification for breast surgery under general anesthesia at the request of [place], Christiane Ha, MD.  Patient is a pleasant 79 year old female.  She has history of essential hypertension dyslipidemia and diabetes mellitus.  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  Now she is planning to undergo lumpectomy with radiation for abnormal findings of the right breast.  She leads a sedentary lifestyle because of orthopedic issues and weakness in the legs.  No chest pain orthopnea or PND.  She is here for preoperative stratification.  At the time of my evaluation, the patient is alert awake oriented and in no distress.  Her EKG has revealed atrial fibrillation with well-controlled ventricular rate.  She does not know of this from before.  Prior EKG at Dr.'s office recently also revealed atrial fibrillation.  Past Medical History:  Diagnosis Date  . Breast CA ([place]) 04/2009   right - radiation and lumpectomy  . Diabetes mellitus 2000   type 2  . Family history of breast cancer    aunt  . Hypercholesteremia 2000  . Hypertension    history  . 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 Postmenopausal    took HRT 1999 - 2004    Past Surgical History:  Procedure Laterality Date  . BREAST LUMPECTOMY  04/2009   rt breast  estrogen +,  Her 2 Nu negative  . COLONOSCOPY WITH PROPOFOL N/A 04/05/2014   Procedure: COLONOSCOPY WITH PROPOFOL;  Surgeon: Garlan Fair, MD;  Location: [facility] ENDOSCOPY;  Service: Endoscopy;  Laterality: N/A;  . ORIF FOOT FRACTURE Right 04/20/2014   done in [place]  . WISDOM TOOTH EXTRACTION      Current Medications: Current Meds  Medication Sig  . Calcium Carbonate-Vitamin D3 (CALCIUM 600-D) 600-400  MG-UNIT TABS Take 1 tablet by mouth 2 (two) times daily.  . cholecalciferol (VITAMIN D3) 25 MCG (1000 UNIT) tablet Take 1,000 Units by mouth daily.  . empagliflozin (JARDIANCE) 10 MG TABS tablet Take 10 mg by mouth daily.  Marland Kitchen ezetimibe (ZETIA) 10 MG tablet Take 10 mg by mouth every evening.   . gabapentin (NEURONTIN) 600 MG tablet Take 600 mg by mouth 3 (three) times daily.  Marland Kitchen HUMALOG MIX 75/25 KWIKPEN (75-25) 100 UNIT/ML Kwikpen Inject 35 Units into the skin 2 (two) times daily.   . metFORMIN (GLUCOPHAGE) 500 MG tablet Take 1,000 mg by mouth 2 (two) times daily.  . Multiple Vitamins-Minerals (PRESERVISION AREDS 2) CAPS Take 1 capsule by mouth 2 (two) times daily.   . rosuvastatin (CRESTOR) 40 MG tablet Take 40 mg by mouth every evening.   . vitamin B-12 (CYANOCOBALAMIN) 1000 MCG tablet Take 1,000 mcg by mouth daily.     Allergies:   Patient has no known allergies.   Social History   Socioeconomic History  . Marital status: Widowed    Spouse name: Not on file  . Number of children: Not on file  . Years of education: Not on file  . Highest education level: Not on file  Occupational History  . Not on file  Tobacco Use  . Smoking status: Former Smoker    Packs/day: 0.25    Years: 30.00    Pack years: 7.50    Types: Cigarettes    Start date: 03/30/1962    Quit date: 11/12/1991    Years since quitting: 28.6  . Smokeless tobacco: Never Used  Vaping Use  . Vaping Use: Never used  Substance and Sexual Activity  . Alcohol use: No    Alcohol/week: 0.0 standard drinks  . Drug use: No  . Sexual activity: Not Currently    Birth control/protection: Post-menopausal, Abstinence  Other Topics Concern  . Not on file  Social History Narrative   2 sons, one adopted   Social Determinants of Health   Financial Resource Strain:   . Difficulty of Paying Living Expenses: Not on file  Food Insecurity:   . Worried About Charity fundraiser in the Last Year: Not on file  . Ran Out of Food in the  Last Year: Not on file  Transportation Needs:   . Lack of Transportation (Medical): Not on file  . Lack of Transportation (Non-Medical): Not on file  Physical Activity:   . Days of Exercise per Week: Not on file  . Minutes of Exercise per Session: Not on file  Stress:   . Feeling of Stress : Not on file  Social Connections:   . Frequency of Communication with Friends and Family: Not on file  . Frequency of Social Gatherings with Friends and Family: Not on file  . Attends Religious Services: Not on file  . Active Member of Clubs or Organizations: Not on file  . Attends Archivist Meetings: Not on file  . Marital Status: Not on file     Family History: The patient's family history includes Diabetes  in her father; Heart disease in her father and mother; Heart failure in her father and mother; Obesity in her brother.  ROS:   Please see the history of present illness.    All other systems reviewed and are negative.  EKGs/Labs/Other Studies Reviewed:    The following studies were reviewed today: EKG: Atrial fibrillation with well-controlled ventricular rate.   Recent Labs: 02/16/2020: ALT 18 06/28/2020: BUN 18; Creatinine, Ser 0.88; Hemoglobin 13.2; Platelets 249; Potassium 4.4; Sodium 139  Recent Lipid Panel No results found for: CHOL, TRIG, HDL, CHOLHDL, VLDL, LDLCALC, LDLDIRECT  Physical Exam:    VS:  BP (!) 132/92   Pulse 67   Ht 5\' 10"  (1.778 m)   Wt 214 lb 1.3 oz (97.1 kg)   LMP  (LMP Unknown)   SpO2 (!) 67%   BMI 30.72 kg/m     Wt Readings from Last 3 Encounters:  07/03/20 214 lb 1.3 oz (97.1 kg)  06/28/20 213 lb 12.8 oz (97 kg)  02/16/20 202 lb (91.6 kg)     GEN: Patient is in no acute distress HEENT: Normal NECK: No JVD; No carotid bruits LYMPHATICS: No lymphadenopathy CARDIAC: S1 S2 irregular, 2/6 systolic murmur at the apex. RESPIRATORY:  Clear to auscultation without rales, wheezing or rhonchi  ABDOMEN: Soft, non-tender,  non-distended MUSCULOSKELETAL:  No edema; No deformity  SKIN: Warm and dry NEUROLOGIC:  Alert and oriented x 3 PSYCHIATRIC:  Normal affect    Signed, Jenean Lindau, MD  07/03/2020 2:56 PM    [facility]

## 2020-07-03 NOTE — Patient Instructions (Addendum)
Medication Instructions:  No medication changes. *If you need a refill on your cardiac medications before your next appointment, please call your pharmacy*   Lab Work: None ordered If you have labs (blood work) drawn today and your tests are completely normal, you will receive your results only by: Marland Kitchen MyChart Message (if you have MyChart) OR . A paper copy in the mail If you have any lab test that is abnormal or we need to change your treatment, we will call you to review the results.   Testing/Procedures: Your physician has requested that you have a lexiscan myoview. For further information please visit [email or web address]. Please follow instruction sheet, as given.  The test will take approximately 3 to 4 hours to complete; you may bring reading material.  If someone comes with you to your appointment, they will need to remain in the main lobby due to limited space in the testing area. **If you are pregnant or breastfeeding, please notify the nuclear lab prior to your appointment**  How to prepare for your Myocardial Perfusion Test: . Do not eat or drink 3 hours prior to your test, except you may have water. . Do not consume products containing caffeine (regular or decaffeinated) 12 hours prior to your test. (ex: coffee, chocolate, sodas, tea). . Do bring a list of your current medications with you.  If not listed below, you may take your medications as normal. . Do wear comfortable clothes (no dresses or overalls) and walking shoes, tennis shoes preferred (No heels or open toe shoes are allowed). . Do NOT wear cologne, perfume, aftershave, or lotions (deodorant is allowed). . If these instructions are not followed, your test will have to be rescheduled.   Your physician has requested that you have an echocardiogram. Echocardiography is a painless test that uses sound waves to create images of your heart. It provides your doctor with information about the size and shape of your heart and  how well your heart's chambers and valves are working. This procedure takes approximately one hour. There are no restrictions for this procedure.     Follow-Up: At [facility], you and your health needs are our priority.  As part of our continuing mission to provide you with exceptional heart care, we have created designated Provider Care Teams.  These Care Teams include your primary Cardiologist (physician) and Advanced Practice Providers (APPs -  Physician Assistants and Nurse Practitioners) who all work together to provide you with the care you need, when you need it.  We recommend signing up for the patient portal called "MyChart".  Sign up information is provided on this After Visit Summary.  MyChart is used to connect with patients for Virtual Visits (Telemedicine).  Patients are able to view lab/test results, encounter notes, upcoming appointments, etc.  Non-urgent messages can be sent to your provider as well.   To learn more about what you can do with MyChart, go to [email or web address].    Your next appointment:   1 month  The format for your next appointment:   In Person  Provider:   Jyl Heinz, MD   Other Instructions  Nuclear Medicine Exam A nuclear medicine exam is a safe and painless imaging test. It helps your health care provider detect and diagnose diseases. It also provides information about the ways your organs work and how they are structured. For a nuclear medicine exam, you will be given a radioactive tracer. This substance is absorbed by your body's organs. A large scanning machine detects the  tracer and creates pictures of the areas that your health care provider wants to know more about. There are several kinds of nuclear medicine exams. They include the following:  CT scan.  MRI scan.  PET scan.  SPECT scan. Tell your health care provider about:  Any allergies you have.  All medicines you are taking, including vitamins, herbs, eye drops,  creams, and over-the-counter medicines.  Any problems you or family members have had with anesthetic medicines.  Any blood disorders you have.  Any surgeries you have had.  Any medical conditions you have.  Whether you are pregnant or may be pregnant.  Whether you are nursing. What are the risks? Generally, this is a safe procedure. However, problems may occur, such as an allergic reaction to the tracer, but this is rare. What happens before the procedure? Medicines Ask your health care provider about:  Changing or stopping your regular medicines. This is especially important if you are taking diabetes medicines or blood thinners.  Taking medicines such as aspirin and ibuprofen. These medicines can thin your blood. Do not take these medicines unless your health care provider tells you to take them.  Taking over-the-counter medicines, vitamins, herbs, and supplements. General instructions  Follow instructions from your health care provider about eating or drinking restrictions.  Do not wear jewelry.  Wear loose, comfortable clothing. You may be asked to wear a hospital gown for the procedure.  Bring previous imaging studies, such as X-rays, with you to the exam if they are available. What happens during the procedure?   An IV may be inserted into one of your veins.  You will be asked to lie on a table or sit in a chair.  You will be given the radioactive tracer. You may get: ? A pill or liquid to swallow. ? An injection. ? Medicine through your IV. ? A gas to inhale.  A large scanning machine will be used to create images of your body. After the pictures are taken, you may have to wait so your health care provider can make sure that enough images were taken. The procedure may vary among health care providers and hospitals. What happens after the procedure?  You may go home after the procedure and return to your usual activities, unless your health care provider tells  you otherwise.  Drink enough water to keep your urine pale yellow. This helps to remove the radioactive tracer from your body.  It is up to you to get the results of your procedure. Ask your health care provider, or the department that is doing the procedure, when your results will be ready.  Get help right away if you have problems breathing. Summary  A nuclear medicine exam is a safe and painless imaging test that provides information about how your organs are working. It is also used to detect and diagnose diseases of various body organs.  Follow your health care provider's instructions about eating and drinking restrictions. Ask whether you should change or stop any medicines.  During the procedure, you will be given a radioactive tracer. A large scanning machine will create images of your body.  You may go home after the procedure and return to your regular activities. Follow your health care provider's instructions.  Get help right away if you have problems breathing. This information is not intended to replace advice given to you by your health care provider. Make sure you discuss any questions you have with your health care provider. Document Revised: 09/16/2018  Document Reviewed: 09/16/2018 Elsevier Patient Education  [employer].  Echocardiogram An echocardiogram is a procedure that uses painless sound waves (ultrasound) to produce an image of the heart. Images from an echocardiogram can provide important information about:  Signs of coronary artery disease (CAD).  Aneurysm detection. An aneurysm is a weak or damaged part of an artery wall that bulges out from the normal force of blood pumping through the body.  Heart size and shape. Changes in the size or shape of the heart can be associated with certain conditions, including heart failure, aneurysm, and CAD.  Heart muscle function.  Heart valve function.  Signs of a past heart attack.  Fluid buildup around the  heart.  Thickening of the heart muscle.  A tumor or infectious growth around the heart valves. Tell a health care provider about:  Any allergies you have.  All medicines you are taking, including vitamins, herbs, eye drops, creams, and over-the-counter medicines.  Any blood disorders you have.  Any surgeries you have had.  Any medical conditions you have.  Whether you are pregnant or may be pregnant. What are the risks? Generally, this is a safe procedure. However, problems may occur, including:  Allergic reaction to dye (contrast) that may be used during the procedure. What happens before the procedure? No specific preparation is needed. You may eat and drink normally. What happens during the procedure?   An IV tube may be inserted into one of your veins.  You may receive contrast through this tube. A contrast is an injection that improves the quality of the pictures from your heart.  A gel will be applied to your chest.  A wand-like tool (transducer) will be moved over your chest. The gel will help to transmit the sound waves from the transducer.  The sound waves will harmlessly bounce off of your heart to allow the heart images to be captured in real-time motion. The images will be recorded on a computer. The procedure may vary among health care providers and hospitals. What happens after the procedure?  You may return to your normal, everyday life, including diet, activities, and medicines, unless your health care provider tells you not to do that. Summary  An echocardiogram is a procedure that uses painless sound waves (ultrasound) to produce an image of the heart.  Images from an echocardiogram can provide important information about the size and shape of your heart, heart muscle function, heart valve function, and fluid buildup around your heart.  You do not need to do anything to prepare before this procedure. You may eat and drink normally.  After the  echocardiogram is completed, you may return to your normal, everyday life, unless your health care provider tells you not to do that. This information is not intended to replace advice given to you by your health care provider. Make sure you discuss any questions you have with your health care provider. Document Revised: 02/18/2019 Document Reviewed: 11/30/2016 Elsevier Patient Education  [employer].

## 2020-07-04 ENCOUNTER — Other Ambulatory Visit: Payer: Self-pay

## 2020-07-04 ENCOUNTER — Ambulatory Visit (HOSPITAL_COMMUNITY): Payer: Medicare Other | Attending: Cardiology

## 2020-07-04 ENCOUNTER — Telehealth (HOSPITAL_COMMUNITY): Payer: Self-pay | Admitting: *Deleted

## 2020-07-04 DIAGNOSIS — Z01810 Encounter for preprocedural cardiovascular examination: Secondary | ICD-10-CM | POA: Insufficient documentation

## 2020-07-04 LAB — ECHOCARDIOGRAM COMPLETE
Area-P 1/2: 4.15 cm2
S' Lateral: 2.8 cm

## 2020-07-04 NOTE — Telephone Encounter (Signed)
Patient given detailed instructions per Myocardial Perfusion Study Information Sheet for the test on 07/05/20. Patient notified to arrive 15 minutes early and that it is imperative to arrive on time for appointment to keep from having the test rescheduled.  If you need to cancel or reschedule your appointment, please call the office within 24 hours of your appointment. . Patient verbalized understanding. Kirstie Peri

## 2020-07-05 ENCOUNTER — Ambulatory Visit (HOSPITAL_COMMUNITY): Payer: Medicare Other | Attending: Cardiology

## 2020-07-05 DIAGNOSIS — Z01810 Encounter for preprocedural cardiovascular examination: Secondary | ICD-10-CM | POA: Diagnosis not present

## 2020-07-05 DIAGNOSIS — E782 Mixed hyperlipidemia: Secondary | ICD-10-CM | POA: Diagnosis not present

## 2020-07-05 DIAGNOSIS — I10 Essential (primary) hypertension: Secondary | ICD-10-CM | POA: Diagnosis not present

## 2020-07-05 DIAGNOSIS — E088 Diabetes mellitus due to underlying condition with unspecified complications: Secondary | ICD-10-CM

## 2020-07-05 DIAGNOSIS — I4891 Unspecified atrial fibrillation: Secondary | ICD-10-CM | POA: Diagnosis present

## 2020-07-05 LAB — MYOCARDIAL PERFUSION IMAGING
LV dias vol: 67 mL (ref 46–106)
LV sys vol: 27 mL
Peak HR: 115 {beats}/min
Rest HR: 87 {beats}/min
SDS: 1
SRS: 0
SSS: 1
TID: 0.85

## 2020-07-05 MED ORDER — TECHNETIUM TC 99M TETROFOSMIN IV KIT
32.3000 | PACK | Freq: Once | INTRAVENOUS | Status: AC | PRN
  Administered 2020-07-05: 32.3 via INTRAVENOUS
  Filled 2020-07-05: qty 33

## 2020-07-05 MED ORDER — REGADENOSON 0.4 MG/5ML IV SOLN
0.4000 mg | Freq: Once | INTRAVENOUS | Status: AC
  Administered 2020-07-05: 0.4 mg via INTRAVENOUS

## 2020-07-05 MED ORDER — TECHNETIUM TC 99M TETROFOSMIN IV KIT
10.4000 | PACK | Freq: Once | INTRAVENOUS | Status: AC | PRN
  Administered 2020-07-05: 10.4 via INTRAVENOUS
  Filled 2020-07-05: qty 11

## 2020-07-06 ENCOUNTER — Ambulatory Visit (HOSPITAL_COMMUNITY): Admission: RE | Admit: 2020-07-06 | Payer: Medicare Other | Source: Home / Self Care | Admitting: Surgery

## 2020-07-06 ENCOUNTER — Encounter (HOSPITAL_COMMUNITY): Admission: RE | Payer: Self-pay | Source: Home / Self Care

## 2020-07-06 SURGERY — BREAST LUMPECTOMY WITH RADIOACTIVE SEED LOCALIZATION
Anesthesia: General | Site: Breast | Laterality: Left

## 2020-07-11 ENCOUNTER — Telehealth: Payer: Self-pay | Admitting: Cardiology

## 2020-07-11 NOTE — Telephone Encounter (Signed)
Patient is calling to follow up with Dr. Geraldo Pitter to see if he is going to send her in a prescription for her AFib. Please advise.

## 2020-07-11 NOTE — Telephone Encounter (Signed)
Advised pt that Dr. Geraldo Pitter was going to wait to start the blood thinner after your surgery. Pt verbalized understanding and had no additional questions.

## 2020-08-03 ENCOUNTER — Telehealth: Payer: Self-pay | Admitting: Cardiology

## 2020-08-03 NOTE — Telephone Encounter (Signed)
Patient wants to know if Dr. Geraldo Pitter wants to see her tomorrow for her scheduled appt or if she should wait and see him until after her procedure with Dr. Lucia Gaskins on 09/07/20. Please advise.

## 2020-08-04 ENCOUNTER — Ambulatory Visit: Payer: Medicare Other | Admitting: Cardiology

## 2020-08-08 ENCOUNTER — Other Ambulatory Visit: Payer: Self-pay | Admitting: Surgery

## 2020-08-08 DIAGNOSIS — Z853 Personal history of malignant neoplasm of breast: Secondary | ICD-10-CM

## 2020-08-22 ENCOUNTER — Other Ambulatory Visit: Payer: Medicare Other

## 2020-08-30 ENCOUNTER — Other Ambulatory Visit: Payer: Self-pay

## 2020-08-30 ENCOUNTER — Encounter (HOSPITAL_BASED_OUTPATIENT_CLINIC_OR_DEPARTMENT_OTHER): Payer: Self-pay | Admitting: Surgery

## 2020-08-30 NOTE — Progress Notes (Signed)
Patient's chart, ECHO, myocardial perfusion study and cardiology note for new onset a-fib reviewed with Dr Gifford Shave, [place] for [facility].

## 2020-09-01 ENCOUNTER — Ambulatory Visit: Payer: Medicare Other

## 2020-09-01 ENCOUNTER — Other Ambulatory Visit: Payer: Self-pay

## 2020-09-01 ENCOUNTER — Other Ambulatory Visit: Payer: Medicare Other

## 2020-09-01 ENCOUNTER — Ambulatory Visit
Admission: RE | Admit: 2020-09-01 | Discharge: 2020-09-01 | Disposition: A | Discharge status: Home / Self Care | Payer: Medicare Other | Source: Ambulatory Visit | Attending: Surgery | Admitting: Surgery

## 2020-09-01 DIAGNOSIS — Z853 Personal history of malignant neoplasm of breast: Secondary | ICD-10-CM

## 2020-09-01 HISTORY — DX: Personal history of irradiation: Z92.3

## 2020-09-04 ENCOUNTER — Encounter (HOSPITAL_BASED_OUTPATIENT_CLINIC_OR_DEPARTMENT_OTHER)
Admission: RE | Admit: 2020-09-04 | Discharge: 2020-09-04 | Disposition: A | Discharge status: Home / Self Care | Payer: Medicare Other | Source: Ambulatory Visit | Attending: Surgery | Admitting: Surgery

## 2020-09-04 ENCOUNTER — Other Ambulatory Visit (HOSPITAL_COMMUNITY)
Admission: RE | Admit: 2020-09-04 | Discharge: 2020-09-04 | Disposition: A | Discharge status: Home / Self Care | Payer: Medicare Other | Source: Ambulatory Visit | Attending: Surgery | Admitting: Surgery

## 2020-09-04 DIAGNOSIS — Z20822 Contact with and (suspected) exposure to covid-19: Secondary | ICD-10-CM | POA: Insufficient documentation

## 2020-09-04 DIAGNOSIS — Z01812 Encounter for preprocedural laboratory examination: Secondary | ICD-10-CM | POA: Insufficient documentation

## 2020-09-04 LAB — BASIC METABOLIC PANEL
Anion gap: 10 (ref 5–15)
BUN: 21 mg/dL (ref 8–23)
CO2: 25 mmol/L (ref 22–32)
Calcium: 9.6 mg/dL (ref 8.9–10.3)
Chloride: 105 mmol/L (ref 98–111)
Creatinine, Ser: 0.96 mg/dL (ref 0.44–1.00)
GFR, Estimated: >60 mL/min (ref >60)
Glucose, Bld: 166 mg/dL — ABNORMAL HIGH (ref 70–99)
Potassium: 5 mmol/L (ref 3.5–5.1)
Sodium: 140 mmol/L (ref 135–145)

## 2020-09-04 LAB — SARS CORONAVIRUS 2 (TAT 6-24 HRS): SARS Coronavirus 2: NEGATIVE

## 2020-09-05 ENCOUNTER — Other Ambulatory Visit: Payer: Self-pay | Admitting: Surgery

## 2020-09-05 DIAGNOSIS — D242 Benign neoplasm of left breast: Secondary | ICD-10-CM

## 2020-09-06 ENCOUNTER — Other Ambulatory Visit: Payer: Self-pay

## 2020-09-06 ENCOUNTER — Ambulatory Visit
Admission: RE | Admit: 2020-09-06 | Discharge: 2020-09-06 | Disposition: A | Discharge status: Home / Self Care | Payer: Medicare Other | Source: Ambulatory Visit | Attending: Surgery | Admitting: Surgery

## 2020-09-06 DIAGNOSIS — D242 Benign neoplasm of left breast: Secondary | ICD-10-CM

## 2020-09-06 NOTE — H&P (Signed)
Vickie Burns  Location: [facility] Patient #: [id number] DOB: 1941/09/05 Widowed / Language: Cleophus Molt / Race: White Female  History of Present Illness   The patient is a 79 year old female who presents with a complaint of breast abnormality.  The PCP is Dr. Particia Nearing  The patient was referred by Dr. Lennie Muckle  She comes by herself.  She noticed a bloody left nipple discharge. This prompted an updated mammogram. She has a history of stage 1 right breast cancer. Dr. Dalbert Batman was her surgeon and Dr. Marin Olp saw her for medical oncology. She completed 5 years of antihormone therapy. It looks like she is still getting Zometa by Dr. Marin Olp. She had a mammogram at [facility] on 04/12/2020 which showed left breast at 1 o'clock in the subareolar location measuring 5 x 5 x 6 mm. Her breast density is "c". She had a left breast biopsy, 1 o'clock, on 04/13/2020 ([id number]) which showed papillary lesion with atypia.  I discussed the findings on pathology. I discussed the possibility of malignancy. I think she is best served with a seed localized left breast lumpectomy. 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  Plan: 1. Left breast lumpectomy (seed localization)  Review of Systems as stated in this history (HPI) or in the review of systems. Otherwise all other 12 point ROS are negative  Past Medical History: 1. Right breast cancer - T1c, N0 - followed by Dr. Pearletha Alfred Right breast lumpectomy with right axillary SLNBx - 05/11/2009 - Dalbert Batman Disease free 2. DM, she is on insulin - sees Dr. Buddy Duty 3. History of breaking both feet - about 5 years ago 4. She is to see Dr. Therisa Doyne soon for an updated colonoscopy  Social History: Widowed 2 sons - one adopted, both live in [place] For exercise, she walks at [facility].  The patient's family history was non contributory.  Past  Surgical History Darden Palmer, [place]; 05/11/2020 11:07 AM) Breast Biopsy  Bilateral. Breast Mass; Local Excision  Right. Colon Polyp Removal - Colonoscopy  Foot Surgery  Right.  Diagnostic Studies History Darden Palmer, [place]; 05/11/2020 11:07 AM) Mammogram  within last year 1-3 years ago Pap Smear  >5 years ago  Allergies Darden Palmer, RMA; 05/11/2020 11:08 AM) No Known Drug Allergies  [10/04/2014]:  Medication History Darden Palmer, [place]; 05/11/2020 11:09 AM) Crestor (40MG  Tablet, Oral) Active. Fluzone High-Dose (0.5ML Susp Pref Syr, Intramuscular) Active. Gabapentin (600MG  Tablet, Oral) Active. Ibuprofen (600MG  Tablet, Oral) Active. Lisinopril (2.5MG  Tablet, Oral) Active. MetFORMIN HCl (1000MG  Tablet, Oral) Active. NovoFine (32G X 6 MM Misc,) Active. Zetia (10MG  Tablet, Oral) Active. Prevnar 13 (Intramuscular) Active. NovoLOG Mix 70/30 FlexPen ((70-30) 100UNIT/ML Susp Pen-inj, Subcutaneous) Active. Aspirin Low Strength (81MG  Tablet Chewable, Oral) Active. Medications Reconciled  Social History Darden Palmer, [place]; 05/11/2020 11:07 AM) Alcohol use  Occasional alcohol use, Remotely quit alcohol use. Caffeine use  Coffee. No drug use  Tobacco use  Former smoker.  Family History Darden Palmer, [place]; 05/11/2020 11:07 AM) Diabetes Mellitus  Father. Heart Disease  Father, Mother.  Pregnancy / Birth History Darden Palmer, [place]; 05/11/2020 11:07 AM) Age of menopause  68-50 Gravida  1 Maternal age  13-30 Para  1  Other Problems Darden Palmer, [place]; 05/11/2020 11:07 AM) Breast Cancer  Diabetes Mellitus     Review of Systems Darden Palmer RMA; 05/11/2020 11:07 AM) General Not Present- Appetite Loss, Chills, Fatigue, Fever, Night Sweats, Weight Gain and  Weight Loss. Skin Not Present- Change in Wart/Mole, Dryness, Hives, Jaundice, New Lesions, Non-Healing Wounds, Rash and Ulcer. HEENT Not Present- Earache, Hearing Loss, Hoarseness, Nose Bleed, Oral Ulcers,  Ringing in the Ears, Seasonal Allergies, Sinus Pain, Sore Throat, Visual Disturbances, Wears glasses/contact lenses and Yellow Eyes. Respiratory Not Present- Bloody sputum, Chronic Cough, Difficulty Breathing, Snoring and Wheezing. Breast Not Present- Breast Mass, Breast Pain, Nipple Discharge and Skin Changes. Cardiovascular Not Present- Chest Pain, Difficulty Breathing Lying Down, Leg Cramps, Palpitations, Rapid Heart Rate, Shortness of Breath and Swelling of Extremities. Gastrointestinal Not Present- Abdominal Pain, Bloating, Bloody Stool, Change in Bowel Habits, Chronic diarrhea, Constipation, Difficulty Swallowing, Excessive gas, Gets full quickly at meals, Hemorrhoids, Indigestion, Nausea, Rectal Pain and Vomiting. Female Genitourinary Not Present- Frequency, Nocturia, Painful Urination, Pelvic Pain and Urgency. Musculoskeletal Not Present- Back Pain, Joint Pain, Joint Stiffness, Muscle Pain, Muscle Weakness and Swelling of Extremities. Neurological Not Present- Decreased Memory, Fainting, Headaches, Numbness, Seizures, Tingling, Tremor, Trouble walking and Weakness. Psychiatric Not Present- Anxiety, Bipolar, Change in Sleep Pattern, Depression, Fearful and Frequent crying. Endocrine Not Present- Cold Intolerance, Excessive Hunger, Hair Changes, Heat Intolerance, Hot flashes and New Diabetes. Hematology Not Present- Blood Thinners, Easy Bruising, Excessive bleeding, Gland problems, HIV and Persistent Infections.  Vitals Lattie Haw [place] RMA; 05/11/2020 11:09 AM) 05/11/2020 11:09 AM Weight: 213.25 lb Height: 70in Body Surface Area: 2.15 m Body Mass Index: 30.6 kg/m  Temp.: 98.62F  Pulse: 70 (Regular)  BP: 126/70(Sitting, Left Arm, Standard)   Physical Exam  General: Tall WN older WF who isalert and generally healthy appearing. She is wearing a mask. HEENT: Normal. Pupils equal.  Neck: Supple. No mass. No thyroid mass.  Lymph Nodes: No supraclavicular or cervical  nodes.  Lungs: Clear to auscultation and symmetric breath sounds. Heart: RRR. No murmur or rub.  Breasts: Right - No mass or nodule Left - She has a circumareolar ecchymosis from the left breast biopsy. I do not feel a specific mass.  Abdomen: Soft. No mass. No tenderness. No hernia. Normal bowel sounds. No abdominal scars. Rectal: Not done.  Extremities: Good strength and ROM in upper and lower extremities.  Neurologic: Grossly intact to motor and sensory function. Psychiatric: Has normal mood and affect. Behavior is normal.   Assessment & Plan  1.  PAPILLOMA OF LEFT BREAST (D24.2)  Plan:  1. Excision of left breast papilloma/lumpectomy (seed localization) (not during 7/20 - 06/07/2020)  2.  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  DIABETES MELLITUS (E11.9)  4.  A. Fib (new onset)  Dr. Geraldo Pitter - myocardial perfusion scan 07/05/2020 - unremarkable  She has new onset A fib - he will plan to start anticoagulation after surgery   Alphonsa Overall, MD, [facility] Office phone:  [phone]

## 2020-09-07 ENCOUNTER — Other Ambulatory Visit: Payer: Self-pay

## 2020-09-07 ENCOUNTER — Ambulatory Visit (HOSPITAL_BASED_OUTPATIENT_CLINIC_OR_DEPARTMENT_OTHER): Payer: Medicare Other | Admitting: Anesthesiology

## 2020-09-07 ENCOUNTER — Encounter (HOSPITAL_BASED_OUTPATIENT_CLINIC_OR_DEPARTMENT_OTHER)
Admission: RE | Disposition: A | Discharge status: Home / Self Care | Payer: Self-pay | Source: Home / Self Care | Attending: Surgery

## 2020-09-07 ENCOUNTER — Encounter (HOSPITAL_BASED_OUTPATIENT_CLINIC_OR_DEPARTMENT_OTHER): Payer: Self-pay | Admitting: Surgery

## 2020-09-07 ENCOUNTER — Ambulatory Visit
Admission: RE | Admit: 2020-09-07 | Discharge: 2020-09-07 | Disposition: A | Discharge status: Home / Self Care | Payer: Medicare Other | Source: Ambulatory Visit | Attending: Surgery | Admitting: Surgery

## 2020-09-07 ENCOUNTER — Ambulatory Visit (HOSPITAL_BASED_OUTPATIENT_CLINIC_OR_DEPARTMENT_OTHER)
Admission: RE | Admit: 2020-09-07 | Discharge: 2020-09-07 | Disposition: A | Discharge status: Home / Self Care | Payer: Medicare Other | Attending: Surgery | Admitting: Surgery

## 2020-09-07 DIAGNOSIS — D242 Benign neoplasm of left breast: Secondary | ICD-10-CM | POA: Insufficient documentation

## 2020-09-07 DIAGNOSIS — Z853 Personal history of malignant neoplasm of breast: Secondary | ICD-10-CM | POA: Diagnosis not present

## 2020-09-07 DIAGNOSIS — E119 Type 2 diabetes mellitus without complications: Secondary | ICD-10-CM | POA: Diagnosis not present

## 2020-09-07 HISTORY — PX: BREAST LUMPECTOMY WITH RADIOACTIVE SEED LOCALIZATION: SHX6424

## 2020-09-07 HISTORY — PX: BREAST EXCISIONAL BIOPSY: SUR124

## 2020-09-07 LAB — GLUCOSE, CAPILLARY
Glucose-Capillary: 220 mg/dL — ABNORMAL HIGH (ref 70–99)
Glucose-Capillary: 209 mg/dL — ABNORMAL HIGH (ref 70–99)

## 2020-09-07 SURGERY — BREAST LUMPECTOMY WITH RADIOACTIVE SEED LOCALIZATION
Anesthesia: General | Site: Breast | Laterality: Left

## 2020-09-07 MED ORDER — 0.9 % SODIUM CHLORIDE (POUR BTL) OPTIME
TOPICAL | Status: DC | PRN
  Administered 2020-09-07: 1000 mL

## 2020-09-07 MED ORDER — PROPOFOL 10 MG/ML IV BOLUS
INTRAVENOUS | Status: AC
  Filled 2020-09-07: qty 20

## 2020-09-07 MED ORDER — TRAMADOL HCL 50 MG PO TABS
50.0000 mg | ORAL_TABLET | Freq: Four times a day (QID) | ORAL | 1 refills | Status: DC | PRN
Start: 2020-09-07 — End: 2020-09-13

## 2020-09-07 MED ORDER — LIDOCAINE HCL (CARDIAC) PF 100 MG/5ML IV SOSY
PREFILLED_SYRINGE | INTRAVENOUS | Status: DC | PRN
  Administered 2020-09-07: 60 mg via INTRAVENOUS

## 2020-09-07 MED ORDER — EPHEDRINE 5 MG/ML INJ
INTRAVENOUS | Status: AC
  Filled 2020-09-07: qty 10

## 2020-09-07 MED ORDER — ACETAMINOPHEN 500 MG PO TABS
1000.0000 mg | ORAL_TABLET | ORAL | Status: AC
  Administered 2020-09-07: 1000 mg via ORAL

## 2020-09-07 MED ORDER — BUPIVACAINE HCL 0.25 % IJ SOLN
INTRAMUSCULAR | Status: DC | PRN
  Administered 2020-09-07: 30 mL

## 2020-09-07 MED ORDER — ONDANSETRON HCL 4 MG/2ML IJ SOLN
INTRAMUSCULAR | Status: AC
  Filled 2020-09-07: qty 2

## 2020-09-07 MED ORDER — DEXAMETHASONE SODIUM PHOSPHATE 10 MG/ML IJ SOLN
INTRAMUSCULAR | Status: AC
  Filled 2020-09-07: qty 1

## 2020-09-07 MED ORDER — LIDOCAINE 2% (20 MG/ML) 5 ML SYRINGE
INTRAMUSCULAR | Status: AC
  Filled 2020-09-07: qty 5

## 2020-09-07 MED ORDER — CHLORHEXIDINE GLUCONATE 4 % EX LIQD: 60.0000 mL | Freq: Once | CUTANEOUS | Status: DC

## 2020-09-07 MED ORDER — PROPOFOL 10 MG/ML IV BOLUS
INTRAVENOUS | Status: DC | PRN
  Administered 2020-09-07: 150 mg via INTRAVENOUS

## 2020-09-07 MED ORDER — OXYCODONE HCL 5 MG PO TABS: 5.0000 mg | ORAL_TABLET | Freq: Once | ORAL | Status: DC | PRN

## 2020-09-07 MED ORDER — PHENYLEPHRINE 40 MCG/ML (10ML) SYRINGE FOR IV PUSH (FOR BLOOD PRESSURE SUPPORT)
PREFILLED_SYRINGE | INTRAVENOUS | Status: AC
  Filled 2020-09-07: qty 10

## 2020-09-07 MED ORDER — CEFAZOLIN SODIUM-DEXTROSE 2-4 GM/100ML-% IV SOLN
2.0000 g | INTRAVENOUS | Status: AC
  Administered 2020-09-07: 2 g via INTRAVENOUS

## 2020-09-07 MED ORDER — FENTANYL CITRATE (PF) 100 MCG/2ML IJ SOLN
INTRAMUSCULAR | Status: DC | PRN
  Administered 2020-09-07 (×2): 50 ug via INTRAVENOUS

## 2020-09-07 MED ORDER — CEFAZOLIN SODIUM-DEXTROSE 2-4 GM/100ML-% IV SOLN
INTRAVENOUS | Status: AC
  Filled 2020-09-07: qty 100

## 2020-09-07 MED ORDER — SUCCINYLCHOLINE CHLORIDE 200 MG/10ML IV SOSY
PREFILLED_SYRINGE | INTRAVENOUS | Status: AC
  Filled 2020-09-07: qty 10

## 2020-09-07 MED ORDER — ACETAMINOPHEN 500 MG PO TABS
ORAL_TABLET | ORAL | Status: AC
  Filled 2020-09-07: qty 2

## 2020-09-07 MED ORDER — DEXAMETHASONE SODIUM PHOSPHATE 4 MG/ML IJ SOLN
INTRAMUSCULAR | Status: DC | PRN
  Administered 2020-09-07: 4 mg via INTRAVENOUS

## 2020-09-07 MED ORDER — FENTANYL CITRATE (PF) 100 MCG/2ML IJ SOLN: 25.0000 ug | INTRAMUSCULAR | Status: DC | PRN

## 2020-09-07 MED ORDER — LACTATED RINGERS IV SOLN: INTRAVENOUS | Status: DC

## 2020-09-07 MED ORDER — OXYCODONE HCL 5 MG/5ML PO SOLN: 5.0000 mg | Freq: Once | ORAL | Status: DC | PRN

## 2020-09-07 MED ORDER — ONDANSETRON HCL 4 MG/2ML IJ SOLN: 4.0000 mg | Freq: Once | INTRAMUSCULAR | Status: DC | PRN

## 2020-09-07 MED ORDER — FENTANYL CITRATE (PF) 100 MCG/2ML IJ SOLN
INTRAMUSCULAR | Status: AC
  Filled 2020-09-07: qty 2

## 2020-09-07 SURGICAL SUPPLY — 55 items
ADH SKN CLS APL DERMABOND .7 (GAUZE/BANDAGES/DRESSINGS) ×1
APL PRP STRL LF DISP 70% ISPRP (MISCELLANEOUS) ×1
APL SKNCLS STERI-STRIP NONHPOA (GAUZE/BANDAGES/DRESSINGS)
BENZOIN TINCTURE PRP APPL 2/3 (GAUZE/BANDAGES/DRESSINGS) IMPLANT
BINDER BREAST LRG (GAUZE/BANDAGES/DRESSINGS) IMPLANT
BINDER BREAST MEDIUM (GAUZE/BANDAGES/DRESSINGS) IMPLANT
BINDER BREAST XLRG (GAUZE/BANDAGES/DRESSINGS) IMPLANT
BINDER BREAST XXLRG (GAUZE/BANDAGES/DRESSINGS) IMPLANT
BLADE HEX COATED 2.75 (ELECTRODE) IMPLANT
BLADE SURG 10 STRL SS (BLADE) IMPLANT
BLADE SURG 15 STRL LF DISP TIS (BLADE) ×1 IMPLANT
BLADE SURG 15 STRL SS (BLADE) ×2
CANISTER SUC SOCK COL 7IN (MISCELLANEOUS) IMPLANT
CANISTER SUCT 1200ML W/VALVE (MISCELLANEOUS) ×2 IMPLANT
CHLORAPREP W/TINT 26 (MISCELLANEOUS) ×2 IMPLANT
CLIP VESOCCLUDE SM WIDE 6/CT (CLIP) ×1 IMPLANT
COVER BACK TABLE 60X90IN (DRAPES) ×2 IMPLANT
COVER MAYO STAND STRL (DRAPES) ×2 IMPLANT
COVER PROBE W GEL 5X96 (DRAPES) ×2 IMPLANT
COVER WAND RF STERILE (DRAPES) IMPLANT
DECANTER SPIKE VIAL GLASS SM (MISCELLANEOUS) ×1 IMPLANT
DERMABOND ADVANCED (GAUZE/BANDAGES/DRESSINGS) ×1
DERMABOND ADVANCED .7 DNX12 (GAUZE/BANDAGES/DRESSINGS) ×1 IMPLANT
DRAPE LAPAROSCOPIC ABDOMINAL (DRAPES) ×2 IMPLANT
DRAPE UTILITY XL STRL (DRAPES) ×2 IMPLANT
DRSG PAD ABDOMINAL 8X10 ST (GAUZE/BANDAGES/DRESSINGS) IMPLANT
ELECT COATED BLADE 2.86 ST (ELECTRODE) ×2 IMPLANT
ELECT REM PT RETURN 9FT ADLT (ELECTROSURGICAL) ×2
ELECTRODE REM PT RTRN 9FT ADLT (ELECTROSURGICAL) ×1 IMPLANT
GAUZE SPONGE 4X4 12PLY STRL (GAUZE/BANDAGES/DRESSINGS) ×1 IMPLANT
GAUZE SPONGE 4X4 12PLY STRL LF (GAUZE/BANDAGES/DRESSINGS) IMPLANT
GLOVE SURG SYN 7.5  E (GLOVE) ×2
GLOVE SURG SYN 7.5 E (GLOVE) ×1 IMPLANT
GLOVE SURG SYN 7.5 PF PI (GLOVE) ×1 IMPLANT
GOWN STRL REUS W/ TWL LRG LVL3 (GOWN DISPOSABLE) ×1 IMPLANT
GOWN STRL REUS W/ TWL XL LVL3 (GOWN DISPOSABLE) ×1 IMPLANT
GOWN STRL REUS W/TWL LRG LVL3 (GOWN DISPOSABLE) ×2
GOWN STRL REUS W/TWL XL LVL3 (GOWN DISPOSABLE) ×2
KIT MARKER MARGIN INK (KITS) ×2 IMPLANT
NDL HYPO 25X1 1.5 SAFETY (NEEDLE) ×1 IMPLANT
NEEDLE HYPO 25X1 1.5 SAFETY (NEEDLE) ×2 IMPLANT
NS IRRIG 1000ML POUR BTL (IV SOLUTION) ×1 IMPLANT
PACK BASIN DAY SURGERY FS (CUSTOM PROCEDURE TRAY) ×2 IMPLANT
PENCIL SMOKE EVACUATOR (MISCELLANEOUS) ×2 IMPLANT
SHEET MEDIUM DRAPE 40X70 STRL (DRAPES) ×1 IMPLANT
SLEEVE SCD COMPRESS KNEE MED (MISCELLANEOUS) ×2 IMPLANT
SPONGE LAP 18X18 RF (DISPOSABLE) ×3 IMPLANT
STRIP CLOSURE SKIN 1/4X4 (GAUZE/BANDAGES/DRESSINGS) IMPLANT
SUT MNCRL AB 4-0 PS2 18 (SUTURE) ×2 IMPLANT
SUT VICRYL 3-0 CR8 SH (SUTURE) ×2 IMPLANT
SYR CONTROL 10ML LL (SYRINGE) ×2 IMPLANT
TOWEL GREEN STERILE FF (TOWEL DISPOSABLE) ×2 IMPLANT
TRAY FAXITRON CT DISP (TRAY / TRAY PROCEDURE) ×2 IMPLANT
TUBE CONNECTING 20X1/4 (TUBING) ×2 IMPLANT
YANKAUER SUCT BULB TIP NO VENT (SUCTIONS) ×2 IMPLANT

## 2020-09-07 NOTE — Anesthesia Procedure Notes (Addendum)
Procedure Name: LMA Insertion Date/Time: 09/07/2020 3:34 PM Performed by: Willa Frater, CRNA Pre-anesthesia Checklist: Patient identified, Emergency Drugs available, Suction available and Patient being monitored Patient Re-evaluated:Patient Re-evaluated prior to induction Oxygen Delivery Method: Circle system utilized Preoxygenation: Pre-oxygenation with 100% oxygen Induction Type: IV induction Ventilation: Mask ventilation without difficulty LMA: LMA inserted LMA Size: 4.0 Number of attempts: 1 Airway Equipment and Method: Bite block Placement Confirmation: positive ETCO2 Tube secured with: Tape Dental Injury: Teeth and Oropharynx as per pre-operative assessment

## 2020-09-07 NOTE — Discharge Instructions (Signed)
  Post Anesthesia Home Care Instructions  Activity: Get plenty of rest for the remainder of the day. A responsible individual must stay with you for 24 hours following the procedure.  For the next 24 hours, DO NOT: -Drive a car -Paediatric nurse -Drink alcoholic beverages -Take any medication unless instructed by your physician -Make any legal decisions or sign important papers.  Meals: Start with liquid foods such as gelatin or soup. Progress to regular foods as tolerated. Avoid greasy, spicy, heavy foods. If nausea and/or vomiting occur, drink only clear liquids until the nausea and/or vomiting subsides. Call your physician if vomiting continues.  Special Instructions/Symptoms: Your throat may feel dry or sore from the anesthesia or the breathing tube placed in your throat during surgery. If this causes discomfort, gargle with warm salt water. The discomfort should disappear within 24 hours.  If you had a scopolamine patch placed behind your ear for the management of post- operative nausea and/or vomiting:  1. The medication in the patch is effective for 72 hours, after which it should be removed.  Wrap patch in a tissue and discard in the trash. Wash hands thoroughly with soap and water. 2. You may remove the patch earlier than 72 hours if you experience unpleasant side effects which may include dry mouth, dizziness or visual disturbances. 3. Avoid touching the patch. Wash your hands with soap and water after contact with the patch.        [facility] - DISCHARGE INSTRUCTIONS TO PATIENT  Activity:  Driving - You may drive in 1 to 3 days   Lifting - No lifting more than 15 pounds for one week                       Practice your Covid-19 protection:  Wear a mask, social distance, and wash your hands frequently  Wound Care:   Leave the incision dry for 2 days, then you may shower  Diet:  As tolerated  Follow up appointment:  Call Dr. Pollie Friar office [facility]) at [phone] for an appointment in 2 to 4 weeks.  Medications and dosages:  Resume your home medications.  You have a prescription for:  Tramadol             You may also take Tylenol, ibuprofen, or Aleve for pain  Call Dr. Lucia Gaskins or his office  [phone]) if you have:  Temperature greater than 100.4,  Persistent nausea and vomiting,  Severe uncontrolled pain,  Redness, tenderness, or signs of infection (pain, swelling, redness, odor or green/yellow discharge around the site),  Difficulty breathing, headache or visual disturbances,  Any other questions or concerns you may have after discharge.  In an emergency, call 911 or go to an Emergency Department at a nearby hospital.

## 2020-09-07 NOTE — Anesthesia Preprocedure Evaluation (Addendum)
Anesthesia Evaluation  Patient identified by MRN, date of birth, ID band Patient awake    Reviewed: Allergy & Precautions, NPO status , Patient's Chart, lab work & pertinent test results  History of Anesthesia Complications Negative for: history of anesthetic complications  Airway Mallampati: II  TM Distance: >3 FB Neck ROM: Full    Dental  (+) Dental Advisory Given, Teeth Intact   Pulmonary former smoker,    Pulmonary exam normal        Cardiovascular + dysrhythmias Atrial Fibrillation  Rhythm:Irregular Rate:Normal   Per cardiology, waiting to start anticoagulation until after procedure    Neuro/Psych negative neurological ROS  negative psych ROS   GI/Hepatic negative GI ROS, Neg liver ROS,   Endo/Other  diabetes, Type 2, Oral Hypoglycemic Agents, Insulin Dependent Obesity   Renal/GU negative Renal ROS     Musculoskeletal negative musculoskeletal ROS (+)   Abdominal   Peds  Hematology negative hematology ROS (+)   Anesthesia Other Findings   Reproductive/Obstetrics  Breast cancer                             Anesthesia Physical Anesthesia Plan  ASA: III  Anesthesia Plan: General   Post-op Pain Management:    Induction: Intravenous  PONV Risk Score and Plan: 3 and Treatment may vary due to age or medical condition, Ondansetron and Dexamethasone  Airway Management Planned: LMA  Additional Equipment: None  Intra-op Plan:   Post-operative Plan: Extubation in OR  Informed Consent: I have reviewed the patients History and Physical, chart, labs and discussed the procedure including the risks, benefits and alternatives for the proposed anesthesia with the patient or authorized representative who has indicated his/her understanding and acceptance.     Dental advisory given  Plan Discussed with: CRNA and Anesthesiologist  Anesthesia Plan Comments:        Anesthesia  Quick Evaluation

## 2020-09-07 NOTE — Op Note (Signed)
09/07/2020  4:54 PM  PATIENT:  Vickie Burns DOB: [birth year] MRN: [id number]  PREOP DIAGNOSIS:   LEFT BREAST PAPILLOMA  POSTOP DIAGNOSIS:    Left breast papilloma, retroareaolar  PROCEDURE:   Procedure(s):  RADIOCATIVE SEED GUIDED LEFT BREAST LUMPECTOMY  SURGEON:   Alphonsa Overall, M.D.  ANESTHESIA:   General  Anesthesiologist: Audry Pili, MD CRNA: Willa Frater, CRNA  General  EBL:  <50  ml  DRAINS:  none   LOCAL MEDICATIONS USED:   30 cc 1/4% marcaine  SPECIMEN:   Breast lumpectomy (6 color paint)  COUNTS CORRECT:  YES  INDICATIONS FOR PROCEDURE:  Vickie Burns is a 79 y.o. (DOB: Apr 06, 1941) white female whose primary care physician is Lajean Manes, MD and comes for left breast lumpectomy.   Vickie Burns had a right breast cancer in 2010 and has done well from the treatment of that breast cancer.  She has had a left nipple discharge and underwent a left breast biopsy on 04/12/2020 which showed a papillary lesion with atypia.  She now comes for excision of that mass.    The indications and potential complications of surgery were explained to the patient. Potential complications include, but are not limited to, bleeding, infection, the need for further surgery, and nerve injury.     She had a I131 seed placed on 09/06/2020 in her left breast at [facility].  The seed is in the retroareolar location of the left breast.     OPERATIVE NOTE:   The patient was taken to operating room # 8 at [facility] where she underwent a general anesthesia  supervised by Anesthesiologist: Audry Pili, MD CRNA: Willa Frater, CRNA. Her left breast and axilla were prepped with  ChloraPrep and sterilely draped.    A time-out was held and the surgical check list was reviewed.    The lesion was in the retroareolar position of the left breast.   I made a superiorly based circumareolar incision.   I used the Neoprobe to identify the I131 seed.  I tried to excise an area  around the tumor of at least 1 cm.    I excised this block of breast tissue approximately 2 cm by 3 cm  in diameter.   I painted the lumpectomy specimen with the 6 color paint kit and did a specimen mammogram which confirmed the mass, clip, and the seed were all in the right position in the specimen.  The specimen was sent to pathology who called back to confirm that they have the seed and the specimen.  I did accidentally cut the skin below the nipple and would repair this when I closed the wound.   I then irrigated the wound with saline. I infiltrated approximately 30 mL of 1/4% Marcaine in the incision.  I then closed the wound in layers using 3-0 Vicryl sutures for the deep layer. At the skin, I closed the incisions with a 4-0 Monocryl suture. The incision was then painted with Dermabond.  She had gauze placed over the wound and her breast placed in a breast binder.   The patient tolerated the procedure well, was transported to the recovery room in good condition. Sponge and needle count were correct at the end of the case.   Final pathology is pending.   Alphonsa Overall, MD, [facility] Pager: [phone] Office phone:  [phone]

## 2020-09-07 NOTE — Transfer of Care (Signed)
Immediate Anesthesia Transfer of Care Note  Patient: Vickie Burns  Procedure(s) Performed: RADIOCATIVE SEED GUIDED LEFT BREAST LUMPECTOMY (Left Breast)  Patient Location: PACU  Anesthesia Type:General  Level of Consciousness: awake, alert , oriented and patient cooperative  Airway & Oxygen Therapy: Patient Spontanous Breathing and Patient connected to face mask oxygen  Post-op Assessment: Report given to RN and Post -op Vital signs reviewed and stable  Post vital signs: Reviewed and stable  Last Vitals:  Vitals Value Taken Time  BP    Temp    Pulse 100 09/07/20 1657  Resp 20 09/07/20 1657  SpO2 100 % 09/07/20 1657  Vitals shown include unvalidated device data.  Last Pain:  Vitals:   09/07/20 1402  TempSrc: Oral  PainSc: 0-No pain         Complications: No complications documented.

## 2020-09-07 NOTE — Anesthesia Postprocedure Evaluation (Signed)
Anesthesia Post Note  Patient: Vickie Burns  Procedure(s) Performed: RADIOCATIVE SEED GUIDED LEFT BREAST LUMPECTOMY (Left Breast)     Anesthesia Post Evaluation No complications documented.  Last Vitals:  Vitals:   09/07/20 1727 09/07/20 1728  BP:  (!) 116/57  Pulse: (!) 103 75  Resp: 17 19  Temp:    SpO2: 99% 99%    Last Pain:  Vitals:   09/07/20 1728  TempSrc:   PainSc: 0-No pain   [id number]              Nolon Nations

## 2020-09-07 NOTE — Interval H&P Note (Signed)
History and Physical Interval Note:  09/07/2020 2:39 PM  Vickie Burns  has presented today for surgery, with the diagnosis of LEFT BREAST PAPILLOMA.  The various methods of treatment have been discussed with the patient and family.   Seed in good location.  Her DIL, Angelena Sand, is with her.  After consideration of risks, benefits and other options for treatment, the patient has consented to  Procedure(s): RADIOCATIVE SEED GUIDED LEFT BREAST LUMPECTOMY (Left) as a surgical intervention.  The patient's history has been reviewed, patient examined, no change in status, stable for surgery.  I have reviewed the patient's chart and labs.  Questions were answered to the patient's satisfaction.     Shann Medal

## 2020-09-08 ENCOUNTER — Encounter (HOSPITAL_BASED_OUTPATIENT_CLINIC_OR_DEPARTMENT_OTHER): Payer: Self-pay | Admitting: Surgery

## 2020-09-08 ENCOUNTER — Other Ambulatory Visit: Payer: Self-pay | Admitting: Family

## 2020-09-11 LAB — SURGICAL PATHOLOGY

## 2020-09-12 DIAGNOSIS — I10 Essential (primary) hypertension: Secondary | ICD-10-CM | POA: Insufficient documentation

## 2020-09-12 DIAGNOSIS — Z803 Family history of malignant neoplasm of breast: Secondary | ICD-10-CM | POA: Insufficient documentation

## 2020-09-12 DIAGNOSIS — Z780 Asymptomatic menopausal state: Secondary | ICD-10-CM | POA: Insufficient documentation

## 2020-09-12 DIAGNOSIS — Z923 Personal history of irradiation: Secondary | ICD-10-CM | POA: Insufficient documentation

## 2020-09-13 ENCOUNTER — Encounter: Payer: Self-pay | Admitting: Cardiology

## 2020-09-13 ENCOUNTER — Other Ambulatory Visit: Payer: Self-pay

## 2020-09-13 ENCOUNTER — Ambulatory Visit (INDEPENDENT_AMBULATORY_CARE_PROVIDER_SITE_OTHER): Payer: Medicare Other | Admitting: Cardiology

## 2020-09-13 VITALS — BP 103/70 | HR 74 | Ht 70.0 in | Wt 207.0 lb

## 2020-09-13 DIAGNOSIS — I4819 Other persistent atrial fibrillation: Secondary | ICD-10-CM

## 2020-09-13 DIAGNOSIS — E088 Diabetes mellitus due to underlying condition with unspecified complications: Secondary | ICD-10-CM | POA: Diagnosis not present

## 2020-09-13 DIAGNOSIS — E7800 Pure hypercholesterolemia, unspecified: Secondary | ICD-10-CM

## 2020-09-13 DIAGNOSIS — I10 Essential (primary) hypertension: Secondary | ICD-10-CM | POA: Diagnosis not present

## 2020-09-13 NOTE — Progress Notes (Signed)
Cardiology Office Note:    Date:  09/13/2020   ID:  Kerry, Odonohue Dec 27, 1940, MRN [id number]  PCP:  Lajean Manes, MD  Cardiologist:  Jenean Lindau, MD   Referring MD: Lajean Manes, MD    ASSESSMENT:    1. Essential hypertension   2. Hypercholesteremia   3. Diabetes mellitus due to underlying condition with unspecified complications ([place])   4. Persistent atrial fibrillation (HCC)    PLAN:    In order of problems listed above:  1. Primary prevention stressed with the patient.  Importance of compliance with diet medication stressed and she vocalized understanding. 2. Essential hypertension: Blood pressure stable now with lifestyle modification.  Matter-of-fact her blood pressure is on the lower side.  She is not on any medications.  I told her to keep herself well-hydrated with additional salt and water in the diet.  She promises to comply.  She will have a Chem-7 and magnesium level today. 3. Diabetes mellitus: Diet was emphasized.  She plans to keep in touch with her primary care physician about this. 4. Mixed dyslipidemia: Diet was emphasized.  Patient is on statins and lipids were reviewed. 5. Persistent atrial fibrillation:I discussed with the patient atrial fibrillation, disease process. Management and therapy including rate and rhythm control, anticoagulation benefits and potential risks were discussed extensively with the patient. Patient had multiple questions which were answered to patient's satisfaction.  I am not going to initiate her on anticoagulation today because of the fact that she feels tired and her blood pressure is borderline.  I will evaluate for her to feel better.  I just feel that she is a fall risk and that can add to the risks of anticoagulation.  Benefits and potential is explained and she vocalized understanding and questions were answered to her satisfaction.  She will be seen in follow-up appointment in a week or earlier if she has any  concerns.  She knows to go to the nearest emergency room for any concerning symptoms.   Medication Adjustments/Labs and Tests Ordered: Current medicines are reviewed at length with the patient today.  Concerns regarding medicines are outlined above.  Orders Placed This Encounter  Procedures   Basic metabolic panel   Magnesium   No orders of the defined types were placed in this encounter.    No chief complaint on file.    History of Present Illness:    Vickie Burns is a 79 y.o. female.  She has past medical history of essential hypertension dyslipidemia diabetes mellitus and newly diagnosed atrial fibrillation.  She has undergone lumpectomy surgery and feels fine.  Patient mentions to me that she feels very weak.  No chest pain orthopnea or PND.  At the time of my evaluation, the patient is alert awake oriented and in no distress.  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   New onset a-fib ([place]) 06/28/2020   Osteoporosis 09/06/2016   Personal history of radiation therapy    Postmenopausal    took HRT  1999 - 2004   Preop cardiovascular exam 07/03/2020   Unspecified atrial fibrillation ([place]) 07/03/2020    Past Surgical History:  Procedure Laterality Date   BREAST BIOPSY     BREAST LUMPECTOMY  04/2009   rt breast  estrogen +, Her 2 Nu negative   BREAST LUMPECTOMY WITH RADIOACTIVE SEED LOCALIZATION Left 09/07/2020   Procedure: RADIOCATIVE SEED GUIDED LEFT BREAST LUMPECTOMY;  Surgeon: Alphonsa Overall, MD;  Location:  [facility];  Service: General;  Laterality: Left;   COLONOSCOPY WITH PROPOFOL N/A 04/05/2014   Procedure: COLONOSCOPY WITH PROPOFOL;  Surgeon: Garlan Fair, MD;  Location: [facility];  Service: Endoscopy;  Laterality: N/A;   ORIF FOOT FRACTURE Right 04/20/2014   done in [place] EXTRACTION      Current Medications: Current Meds  Medication Sig   Calcium Carbonate-Vitamin D3 (CALCIUM 600-D) 600-400 MG-UNIT TABS Take 1 tablet by mouth 2 (two) times daily.   cholecalciferol (VITAMIN D3) 25 MCG (1000 UNIT) tablet Take 1,000 Units by mouth daily.   empagliflozin (JARDIANCE) 10 MG TABS tablet Take 20 mg by mouth daily.    ezetimibe (ZETIA) 10 MG tablet Take 10 mg by mouth every evening.    gabapentin (NEURONTIN) 600 MG tablet Take 600 mg by mouth 3 (three) times daily.   HUMALOG MIX 75/25 KWIKPEN (75-25) 100 UNIT/ML Kwikpen Inject 35 Units into the skin 2 (two) times daily. PT DOES 35U IN AM AND 20U IN PM   metFORMIN (GLUCOPHAGE) 500 MG tablet Take 1,000 mg by mouth 2 (two) times daily.   Multiple Vitamins-Minerals (PRESERVISION AREDS 2) CAPS Take 1 capsule by mouth 2 (two) times daily.    rosuvastatin (CRESTOR) 40 MG tablet Take 40 mg by mouth every evening.      Allergies:   Patient has no known allergies.   Social History   Socioeconomic History   Marital status: Widowed    Spouse name: Not on file   Number of children: Not on file   Years of education: Not on file   Highest education level: Not on file  Occupational History   Not on file  Tobacco Use   Smoking status: Former Smoker    Packs/day: 0.25    Years: 30.00    Pack years: 7.50    Types: Cigarettes    Start date: 03/30/1962    Quit date: 11/12/1991    Years since quitting: 28.8   Smokeless tobacco: Never Used  Vaping Use   Vaping Use: Never used  Substance and Sexual Activity   Alcohol use: No    Alcohol/week: 0.0 standard drinks   Drug use: No   Sexual  activity: Not Currently    Birth control/protection: Post-menopausal  Other Topics Concern   Not on file  Social History Narrative   2 sons, one adopted   Social Determinants of Health   Financial Resource Strain:    Difficulty of Paying Living Expenses: Not on file  Food Insecurity:    Worried About Charity fundraiser in the Last Year: Not on file   [employer] of Food in the Last Year: Not on file  Transportation Needs:    Lack of Transportation (Medical): Not on file   Lack of Transportation (Non-Medical): Not on file  Physical Activity:    Days of Exercise per Week: Not on file   Minutes of Exercise per Session: Not on file  Stress:    Feeling of Stress : Not on file  Social  Connections:    Frequency of Communication with Friends and Family: Not on file   Frequency of Social Gatherings with Friends and Family: Not on file   Attends Religious Services: Not on file   Active Member of Clubs or Organizations: Not on file   Attends Archivist Meetings: Not on file   Marital Status: Not on file     Family History: The patient's family history includes Diabetes in her father; Heart disease in her father and mother; Heart failure in her father and mother; Obesity in her brother.  ROS:   Please see the history of present illness.    All other systems reviewed and are negative.  EKGs/Labs/Other Studies Reviewed:    The following studies were reviewed today: Study Highlights   The left ventricular ejection fraction is normal (55-65%).  Nuclear stress EF: 60%.  There was no ST segment deviation noted during stress.  The study is normal.  This is a low risk study.    Recent Labs: 02/16/2020: ALT 18 06/28/2020: Hemoglobin 13.2; Platelets 249 09/04/2020: BUN 21; Creatinine, Ser 0.96; Potassium 5.0; Sodium 140  Recent Lipid Panel No results found for: CHOL, TRIG, HDL, CHOLHDL, VLDL, LDLCALC, LDLDIRECT  Physical Exam:    VS:  BP 103/70    Pulse  74    Ht 5\' 10"  (1.778 m)    Wt 207 lb (93.9 kg)    LMP  (LMP Unknown)    SpO2 99%    BMI 29.70 kg/m     Wt Readings from Last 3 Encounters:  09/13/20 207 lb (93.9 kg)  09/07/20 208 lb 8.9 oz (94.6 kg)  07/05/20 214 lb (97.1 kg)     GEN: Patient is in no acute distress HEENT: Normal NECK: No JVD; No carotid bruits LYMPHATICS: No lymphadenopathy CARDIAC: Hear sounds regular, 2/6 systolic murmur at the apex. RESPIRATORY:  Clear to auscultation without rales, wheezing or rhonchi  ABDOMEN: Soft, non-tender, non-distended MUSCULOSKELETAL:  No edema; No deformity  SKIN: Warm and dry NEUROLOGIC:  Alert and oriented x 3 PSYCHIATRIC:  Normal affect   Signed, Jenean Lindau, MD  09/13/2020 1:37 PM    [facility]

## 2020-09-13 NOTE — Patient Instructions (Signed)
Medication Instructions:  No medication changes. *If you need a refill on your cardiac medications before your next appointment, please call your pharmacy*   Lab Work: Your physician recommends that you have a BMET and magnesium today in the office.  If you have labs (blood work) drawn today and your tests are completely normal, you will receive your results only by: Marland Kitchen MyChart Message (if you have MyChart) OR . A paper copy in the mail If you have any lab test that is abnormal or we need to change your treatment, we will call you to review the results.   Testing/Procedures: None ordered   Follow-Up: At [facility], you and your health needs are our priority.  As part of our continuing mission to provide you with exceptional heart care, we have created designated Provider Care Teams.  These Care Teams include your primary Cardiologist (physician) and Advanced Practice Providers (APPs -  Physician Assistants and Nurse Practitioners) who all work together to provide you with the care you need, when you need it.  We recommend signing up for the patient portal called "MyChart".  Sign up information is provided on this After Visit Summary.  MyChart is used to connect with patients for Virtual Visits (Telemedicine).  Patients are able to view lab/test results, encounter notes, upcoming appointments, etc.  Non-urgent messages can be sent to your provider as well.   To learn more about what you can do with MyChart, go to [email or web address].    Your next appointment:   1 week(s)  The format for your next appointment:   In Person  Provider:   Jyl Heinz, MD   Other Instructions NA

## 2020-09-14 LAB — BASIC METABOLIC PANEL
BUN/Creatinine Ratio: 21 (ref 12–28)
BUN: 24 mg/dL (ref 8–27)
CO2: 24 mmol/L (ref 20–29)
Calcium: 10.5 mg/dL — ABNORMAL HIGH (ref 8.7–10.3)
Chloride: 99 mmol/L (ref 96–106)
Creatinine, Ser: 1.17 mg/dL — ABNORMAL HIGH (ref 0.57–1.00)
GFR calc Af Amer: 52 mL/min/{1.73_m2} — ABNORMAL LOW (ref >59)
GFR calc non Af Amer: 45 mL/min/{1.73_m2} — ABNORMAL LOW (ref >59)
Glucose: 83 mg/dL (ref 65–99)
Potassium: 4.2 mmol/L (ref 3.5–5.2)
Sodium: 138 mmol/L (ref 134–144)

## 2020-09-14 LAB — MAGNESIUM: Magnesium: 1.9 mg/dL (ref 1.6–2.3)

## 2020-09-19 ENCOUNTER — Ambulatory Visit (INDEPENDENT_AMBULATORY_CARE_PROVIDER_SITE_OTHER): Payer: Medicare Other | Admitting: Cardiology

## 2020-09-19 ENCOUNTER — Other Ambulatory Visit: Payer: Self-pay

## 2020-09-19 ENCOUNTER — Telehealth: Payer: Self-pay

## 2020-09-19 ENCOUNTER — Encounter: Payer: Self-pay | Admitting: Cardiology

## 2020-09-19 VITALS — BP 130/82 | HR 76 | Ht 70.0 in | Wt 214.0 lb

## 2020-09-19 DIAGNOSIS — E088 Diabetes mellitus due to underlying condition with unspecified complications: Secondary | ICD-10-CM

## 2020-09-19 DIAGNOSIS — E7800 Pure hypercholesterolemia, unspecified: Secondary | ICD-10-CM

## 2020-09-19 DIAGNOSIS — I10 Essential (primary) hypertension: Secondary | ICD-10-CM | POA: Diagnosis not present

## 2020-09-19 DIAGNOSIS — Z7901 Long term (current) use of anticoagulants: Secondary | ICD-10-CM

## 2020-09-19 DIAGNOSIS — I4819 Other persistent atrial fibrillation: Secondary | ICD-10-CM

## 2020-09-19 MED ORDER — APIXABAN 5 MG PO TABS: 5.0000 mg | ORAL_TABLET | Freq: Two times a day (BID) | ORAL | 6 refills | Status: DC

## 2020-09-19 NOTE — Telephone Encounter (Signed)
4 boxes of Eliquis given per Dr. Geraldo Pitter. Lot # [id number] Exp: 06/2022

## 2020-09-19 NOTE — Progress Notes (Signed)
Cardiology Office Note:    Date:  09/19/2020   ID:  Vickie, Burns 07-03-1941, MRN [id number]  PCP:  Vickie Manes, MD  Cardiologist:  Vickie Lindau, MD   Referring MD: Vickie Manes, MD    ASSESSMENT:    1. Essential hypertension   2. Hypercholesteremia   3. Persistent atrial fibrillation ([place])   4. Diabetes mellitus due to underlying condition with unspecified complications ([place])    PLAN:    In order of problems listed above:  1. Persistent atrial fibrillation:I discussed with the patient atrial fibrillation, disease process. Management and therapy including rate and rhythm control, anticoagulation benefits and potential risks were discussed extensively with the patient. Patient had multiple questions which were answered to patient's satisfaction.  Patient appears much better today and will do initiate her on anticoagulation with Eliquis 5 mg twice daily.  Benefits and potential is explained and she vocalized understanding.  She will have a Chem-7 and CBC today.  We will give her an eye follow-up so she can check it in a week from now. 2. Essential hypertension: Blood pressure stable and diet was emphasized and lifestyle modification also 3. Dehydration: Patient is well hydrated now and we will check her Chem-7. 4. Diabetes mellitus: Managed by primary care physician. 5. Appointment in a month or earlier if she has any concerns.  She had multiple questions which were answered to her satisfaction.   Medication Adjustments/Labs and Tests Ordered: Current medicines are reviewed at length with the patient today.  Concerns regarding medicines are outlined above.  No orders of the defined types were placed in this encounter.  No orders of the defined types were placed in this encounter.    No chief complaint on file.    History of Present Illness:    Vickie Burns is a 79 y.o. female.  Patient has past medical history of essential hypertension dyslipidemia  and diabetes mellitus.  She is also diagnosed to have atrial fibrillation.  Patient denies any problems at this time and takes care of activities of daily living.  Last time I saw her after her breast procedure she was significantly dehydrated and having low blood pressure issues.  She kept herself well-hydrated and subsequently she is done fine.  At the time of my evaluation, the patient is alert awake oriented and in no distress.  She feels very much better now with hydration.  Past Medical History:  Diagnosis Date  . Breast CA ([place]) 04/2009   right - radiation and lumpectomy  . Diabetes mellitus 2000   type 2  . Diabetes mellitus due to underlying condition with unspecified complications ([place]) 2/83/6629  . Diabetes mellitus, type 2 ([place]) 01/27/2012  . Essential hypertension 10/07/2016  . Family history of breast cancer    aunt  . HLD (hyperlipidemia) 08/17/2012  . Hypercholesteremia 2000  . Hypertension    history  . 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 New onset a-fib ([place]) 06/28/2020  . Osteoporosis 09/06/2016  . Personal history of radiation therapy   . Postmenopausal    took HRT 1999 - 2004  . Preop cardiovascular exam 07/03/2020  . Unspecified atrial fibrillation ([place]) 07/03/2020    Past Surgical History:  Procedure Laterality Date  . BREAST BIOPSY    . BREAST LUMPECTOMY  04/2009   rt breast  estrogen +, Her 2 Nu negative  . BREAST LUMPECTOMY WITH RADIOACTIVE SEED LOCALIZATION Left 09/07/2020   Procedure: RADIOCATIVE SEED GUIDED LEFT BREAST LUMPECTOMY;  Surgeon: Alphonsa Overall, MD;  Location: [facility];  Service: General;  Laterality: Left;  . COLONOSCOPY WITH PROPOFOL N/A 04/05/2014   Procedure: COLONOSCOPY WITH PROPOFOL;  Surgeon: Garlan Fair, MD;  [facility];  Service: Endoscopy;  Laterality: N/A;  . ORIF FOOT FRACTURE Right 04/20/2014   done in [place]  . WISDOM TOOTH EXTRACTION      Current Medications: Current Meds  Medication  Sig  . Calcium Carbonate-Vitamin D3 (CALCIUM 600-D) 600-400 MG-UNIT TABS Take 1 tablet by mouth 2 (two) times daily.  . cholecalciferol (VITAMIN D3) 25 MCG (1000 UNIT) tablet Take 1,000 Units by mouth daily.  . empagliflozin (JARDIANCE) 10 MG TABS tablet Take 20 mg by mouth daily.   Marland Kitchen ezetimibe (ZETIA) 10 MG tablet Take 10 mg by mouth every evening.   . gabapentin (NEURONTIN) 600 MG tablet Take 600 mg by mouth 3 (three) times daily.  Marland Kitchen HUMALOG MIX 75/25 KWIKPEN (75-25) 100 UNIT/ML Kwikpen Inject 35 Units into the skin 2 (two) times daily. PT DOES 35U IN AM AND 20U IN PM  . metFORMIN (GLUCOPHAGE) 500 MG tablet Take 1,000 mg by mouth 2 (two) times daily.  . Multiple Vitamins-Minerals (PRESERVISION AREDS 2) CAPS Take 1 capsule by mouth 2 (two) times daily.   . rosuvastatin (CRESTOR) 40 MG tablet Take 40 mg by mouth every evening.   . vitamin B-12 (CYANOCOBALAMIN) 1000 MCG tablet Take 1,000 mcg by mouth daily.     Allergies:   Patient has no known allergies.   Social History   Socioeconomic History  . Marital status: Widowed    Spouse name: Not on file  . Number of children: Not on file  . Years of education: Not on file  . Highest education level: Not on file  Occupational History  . Not on file  Tobacco Use  . Smoking status: Former Smoker    Packs/day: 0.25    Years: 30.00    Pack years: 7.50    Types: Cigarettes    Start date: 03/30/1962    Quit date: 11/12/1991    Years since quitting: 28.8  . Smokeless tobacco: Never Used  Vaping Use  . Vaping Use: Never used  Substance and Sexual Activity  . Alcohol use: No    Alcohol/week: 0.0 standard drinks  . Drug use: No  . Sexual activity: Not Currently    Birth control/protection: Post-menopausal  Other Topics Concern  . Not on file  Social History Narrative   2 sons, one adopted   Social Determinants of Health   Financial Resource Strain:   . Difficulty of Paying Living Expenses: Not on file  Food Insecurity:   . Worried  About Charity fundraiser in the Last Year: Not on file  . Ran Out of Food in the Last Year: Not on file  Transportation Needs:   . Lack of Transportation (Medical): Not on file  . Lack of Transportation (Non-Medical): Not on file  Physical Activity:   . Days of Exercise per Week: Not on file  . Minutes of Exercise per Session: Not on file  Stress:   . Feeling of Stress : Not on file  Social Connections:   . Frequency of Communication with Friends and Family: Not on file  . Frequency of Social Gatherings with Friends and Family: Not on file  . Attends Religious Services: Not on file  . Active Member of Clubs or Organizations: Not on file  . Attends Archivist Meetings: Not on  file  . Marital Status: Not on file     Family History: The patient's family history includes Diabetes in her father; Heart disease in her father and mother; Heart failure in her father and mother; Obesity in her brother.  ROS:   Please see the history of present illness.    All other systems reviewed and are negative.  EKGs/Labs/Other Studies Reviewed:    The following studies were reviewed today: I discussed my findings with the patient in extensive length.   Recent Labs: 02/16/2020: ALT 18 06/28/2020: Hemoglobin 13.2; Platelets 249 09/13/2020: BUN 24; Creatinine, Ser 1.17; Magnesium 1.9; Potassium 4.2; Sodium 138  Recent Lipid Panel No results found for: CHOL, TRIG, HDL, CHOLHDL, VLDL, LDLCALC, LDLDIRECT  Physical Exam:    VS:  BP 130/82   Pulse 76   Ht 5\' 10"  (1.778 m)   Wt 214 lb (97.1 kg)   LMP  (LMP Unknown)   SpO2 98%   BMI 30.71 kg/m     Wt Readings from Last 3 Encounters:  09/19/20 214 lb (97.1 kg)  09/13/20 207 lb (93.9 kg)  09/07/20 208 lb 8.9 oz (94.6 kg)     GEN: Patient is in no acute distress HEENT: Normal NECK: No JVD; No carotid bruits LYMPHATICS: No lymphadenopathy CARDIAC: Hear sounds regular, 2/6 systolic murmur at the apex. RESPIRATORY:  Clear to  auscultation without rales, wheezing or rhonchi  ABDOMEN: Soft, non-tender, non-distended MUSCULOSKELETAL:  No edema; No deformity  SKIN: Warm and dry NEUROLOGIC:  Alert and oriented x 3 PSYCHIATRIC:  Normal affect   Signed, Vickie Lindau, MD  09/19/2020 2:32 PM    [facility]

## 2020-09-19 NOTE — Patient Instructions (Signed)
Medication Instructions:  Your physician has recommended you make the following change in your medication:   Start Eliquis 5 mg twice daily.  *If you need a refill on your cardiac medications before your next appointment, please call your pharmacy*   Lab Work: Your physician recommends that you have labs done in the office today. Your test included  basic metabolic panel and complete blood count.   If you have labs (blood work) drawn today and your tests are completely normal, you will receive your results only by:  [place] (if you have MyChart) OR  A paper copy in the mail If you have any lab test that is abnormal or we need to change your treatment, we will call you to review the results.   Testing/Procedures: None ordered   Follow-Up: At [facility], you and your health needs are our priority.  As part of our continuing mission to provide you with exceptional heart care, we have created designated Provider Care Teams.  These Care Teams include your primary Cardiologist (physician) and Advanced Practice Providers (APPs -  Physician Assistants and Nurse Practitioners) who all work together to provide you with the care you need, when you need it.  We recommend signing up for the patient portal called "MyChart".  Sign up information is provided on this After Visit Summary.  MyChart is used to connect with patients for Virtual Visits (Telemedicine).  Patients are able to view lab/test results, encounter notes, upcoming appointments, etc.  Non-urgent messages can be sent to your provider as well.   To learn more about what you can do with MyChart, go to [email or web address].    Your next appointment:   1 month(s)  The format for your next appointment:   In Person  Provider:   Jyl Heinz, MD   Other Instructions Apixaban oral tablets What is this medicine? APIXABAN (a PIX a ban) is an anticoagulant (blood thinner). It is used to lower the chance of stroke in  people with a medical condition called atrial fibrillation. It is also used to treat or prevent blood clots in the lungs or in the veins. This medicine may be used for other purposes; ask your health care provider or pharmacist if you have questions. COMMON BRAND NAME(S): Eliquis What should I tell my health care provider before I take this medicine? They need to know if you have any of these conditions:  antiphospholipid antibody syndrome  bleeding disorders  bleeding in the brain  blood in your stools (black or tarry stools) or if you have blood in your vomit  history of blood clots  history of stomach bleeding  kidney disease  liver disease  mechanical heart valve  an unusual or allergic reaction to apixaban, other medicines, foods, dyes, or preservatives  pregnant or trying to get pregnant  breast-feeding How should I use this medicine? Take this medicine by mouth with a glass of water. Follow the directions on the prescription label. You can take it with or without food. If it upsets your stomach, take it with food. Take your medicine at regular intervals. Do not take it more often than directed. Do not stop taking except on your doctor's advice. Stopping this medicine may increase your risk of a blood clot. Be sure to refill your prescription before you run out of medicine. Talk to your pediatrician regarding the use of this medicine in children. Special care may be needed. Overdosage: If you think you have taken too much of this medicine contact  a poison control center or emergency room at once. NOTE: This medicine is only for you. Do not share this medicine with others. What if I miss a dose? If you miss a dose, take it as soon as you can. If it is almost time for your next dose, take only that dose. Do not take double or extra doses. What may interact with this medicine? This medicine may interact with the following:  aspirin and aspirin-like medicines  certain  medicines for fungal infections like ketoconazole and itraconazole  certain medicines for seizures like carbamazepine and phenytoin  certain medicines that treat or prevent blood clots like warfarin, enoxaparin, and dalteparin  clarithromycin  NSAIDs, medicines for pain and inflammation, like ibuprofen or naproxen  rifampin  ritonavir  St. John's wort This list may not describe all possible interactions. Give your health care provider a list of all the medicines, herbs, non-prescription drugs, or dietary supplements you use. Also tell them if you smoke, drink alcohol, or use illegal drugs. Some items may interact with your medicine. What should I watch for while using this medicine? Visit your healthcare professional for regular checks on your progress. You may need blood work done while you are taking this medicine. Your condition will be monitored carefully while you are receiving this medicine. It is important not to miss any appointments. Avoid sports and activities that might cause injury while you are using this medicine. Severe falls or injuries can cause unseen bleeding. Be careful when using sharp tools or knives. Consider using an Copy. Take special care brushing or flossing your teeth. Report any injuries, bruising, or red spots on the skin to your healthcare professional. If you are going to need surgery or other procedure, tell your healthcare professional that you are taking this medicine. Wear a medical ID bracelet or chain. Carry a card that describes your disease and details of your medicine and dosage times. What side effects may I notice from receiving this medicine? Side effects that you should report to your doctor or health care professional as soon as possible:  allergic reactions like skin rash, itching or hives, swelling of the face, lips, or tongue  signs and symptoms of bleeding such as bloody or black, tarry stools; red or dark-brown urine; spitting up  blood or brown material that looks like coffee grounds; red spots on the skin; unusual bruising or bleeding from the eye, gums, or nose  signs and symptoms of a blood clot such as chest pain; shortness of breath; pain, swelling, or warmth in the leg  signs and symptoms of a stroke such as changes in vision; confusion; trouble speaking or understanding; severe headaches; sudden numbness or weakness of the face, arm or leg; trouble walking; dizziness; loss of coordination This list may not describe all possible side effects. Call your doctor for medical advice about side effects. You may report side effects to FDA at 1-800-FDA-1088. Where should I keep my medicine? Keep out of the reach of children. Store at room temperature between 20 and 25 degrees C (68 and 77 degrees F). Throw away any unused medicine after the expiration date. NOTE: This sheet is a summary. It may not cover all possible information. If you have questions about this medicine, talk to your doctor, pharmacist, or health care provider.  2020 Elsevier/Gold Standard (2018-07-08 17:39:34)

## 2020-09-20 LAB — CBC WITH DIFFERENTIAL/PLATELET
Basophils Absolute: 0.1 10*3/uL (ref 0.0–0.2)
Basos: 1 %
EOS (ABSOLUTE): 0.2 10*3/uL (ref 0.0–0.4)
Eos: 2 %
Hematocrit: 39.3 % (ref 34.0–46.6)
Hemoglobin: 12.5 g/dL (ref 11.1–15.9)
Immature Grans (Abs): 0 10*3/uL (ref 0.0–0.1)
Immature Granulocytes: 0 %
Lymphocytes Absolute: 1.6 10*3/uL (ref 0.7–3.1)
Lymphs: 17 %
MCH: 28.1 pg (ref 26.6–33.0)
MCHC: 31.8 g/dL (ref 31.5–35.7)
MCV: 88 fL (ref 79–97)
Monocytes Absolute: 0.8 10*3/uL (ref 0.1–0.9)
Monocytes: 9 %
Neutrophils Absolute: 6.7 10*3/uL (ref 1.4–7.0)
Neutrophils: 71 %
Platelets: 289 10*3/uL (ref 150–450)
RBC: 4.45 x10E6/uL (ref 3.77–5.28)
RDW: 14.3 % (ref 11.7–15.4)
WBC: 9.4 10*3/uL (ref 3.4–10.8)

## 2020-09-20 LAB — BASIC METABOLIC PANEL
BUN/Creatinine Ratio: 28 (ref 12–28)
BUN: 23 mg/dL (ref 8–27)
CO2: 25 mmol/L (ref 20–29)
Calcium: 9.5 mg/dL (ref 8.7–10.3)
Chloride: 103 mmol/L (ref 96–106)
Creatinine, Ser: 0.83 mg/dL (ref 0.57–1.00)
GFR calc Af Amer: 79 mL/min/{1.73_m2} (ref >59)
GFR calc non Af Amer: 68 mL/min/{1.73_m2} (ref >59)
Glucose: 75 mg/dL (ref 65–99)
Potassium: 4.6 mmol/L (ref 3.5–5.2)
Sodium: 140 mmol/L (ref 134–144)

## 2020-10-17 ENCOUNTER — Other Ambulatory Visit: Payer: Self-pay

## 2020-10-17 ENCOUNTER — Telehealth: Payer: Self-pay

## 2020-10-17 ENCOUNTER — Ambulatory Visit (INDEPENDENT_AMBULATORY_CARE_PROVIDER_SITE_OTHER): Payer: Medicare Other | Admitting: Cardiology

## 2020-10-17 ENCOUNTER — Encounter: Payer: Self-pay | Admitting: Cardiology

## 2020-10-17 VITALS — BP 134/86 | HR 82 | Ht 70.0 in | Wt 214.0 lb

## 2020-10-17 DIAGNOSIS — I4891 Unspecified atrial fibrillation: Secondary | ICD-10-CM

## 2020-10-17 DIAGNOSIS — E7800 Pure hypercholesterolemia, unspecified: Secondary | ICD-10-CM

## 2020-10-17 DIAGNOSIS — I10 Essential (primary) hypertension: Secondary | ICD-10-CM | POA: Diagnosis not present

## 2020-10-17 DIAGNOSIS — E088 Diabetes mellitus due to underlying condition with unspecified complications: Secondary | ICD-10-CM

## 2020-10-17 NOTE — Progress Notes (Signed)
Cardiology Office Note:    Date:  10/17/2020   ID:  Vickie Burns, Vickie Burns [birth year], MRN [id number]  PCP:  Lajean Manes, MD  Cardiologist:  Jenean Lindau, MD   Referring MD: Lajean Manes, MD    ASSESSMENT:    1. Atrial fibrillation, unspecified type ([place])   2. Essential hypertension   3. Hypercholesteremia   4. Diabetes mellitus due to underlying condition with unspecified complications ([place])    PLAN:    In order of problems listed above:  1. Primary prevention stressed with the patient.  Importance of compliance with diet medication stressed and she vocalized understanding. 2. Essential hypertension: Blood pressure stable and diet was emphasized.  She was advised to walk about half an hour a day on a daily basis. 3. Atrial fibrillation:I discussed with the patient atrial fibrillation, disease process. Management and therapy including rate and rhythm control, anticoagulation benefits and potential risks were discussed extensively with the patient. Patient had multiple questions which were answered to patient's satisfaction. 4. Mixed dyslipidemia: On statin therapy and tolerating well. 5. Diabetes mellitus: Managed by primary care physician.  Hemoglobin A1c was elevated and I discussed diet and exercise with her. 6. Patient will be seen in follow-up appointment in 3 months or earlier if the patient has any concerns    Medication Adjustments/Labs and Tests Ordered: Current medicines are reviewed at length with the patient today.  Concerns regarding medicines are outlined above.  No orders of the defined types were placed in this encounter.  No orders of the defined types were placed in this encounter.    No chief complaint on file.    History of Present Illness:    Vickie Burns is a 79 y.o. female.  She has past medical history of essential hypertension dyslipidemia diabetes mellitus and atrial fibrillation.  She denies any problems at this time and takes  care of activities of daily living.  No chest pain orthopnea or PND.  She walks some on a regular basis and is getting ready to join [employer] sne[employer].  At the time of my evaluation, the patient is alert awake oriented and in no distress.  Past Medical History:  Diagnosis Date  . Breast CA ([place]) 04/2009   right - radiation and lumpectomy  . Diabetes mellitus due to underlying condition with unspecified complications ([place]) 03/20/2584  . Diabetes mellitus, type 2 ([place]) 01/27/2012  . Essential hypertension 10/07/2016  . Family history of breast cancer    aunt  . HLD (hyperlipidemia) 08/17/2012  . Hypercholesteremia 2000  . Hypertension    history  . 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 New onset a-fib ([place]) 06/28/2020  . Osteoporosis 09/06/2016  . Personal history of radiation therapy   . Postmenopausal    took HRT 1999 - 2004  . Preop cardiovascular exam 07/03/2020  . Unspecified atrial fibrillation ([place]) 07/03/2020    Past Surgical History:  Procedure Laterality Date  . BREAST BIOPSY    . BREAST LUMPECTOMY  04/2009   rt breast  estrogen +, Her 2 Nu negative  . BREAST LUMPECTOMY WITH RADIOACTIVE SEED LOCALIZATION Left 09/07/2020   Procedure: RADIOCATIVE SEED GUIDED LEFT BREAST LUMPECTOMY;  Surgeon: Alphonsa Overall, MD;  Location: [facility];  Service: General;  Laterality: Left;  . COLONOSCOPY WITH PROPOFOL N/A 04/05/2014   Procedure: COLONOSCOPY WITH PROPOFOL;  Surgeon: Garlan Fair, MD;  Location: [facility];  Service: Endoscopy;  Laterality: N/A;  . ORIF FOOT FRACTURE Right 04/20/2014  done in [place]  . WISDOM TOOTH EXTRACTION      Current Medications: Current Meds  Medication Sig  . apixaban (ELIQUIS) 5 MG TABS tablet Take 1 tablet (5 mg total) by mouth 2 (two) times daily.  . Calcium Carbonate-Vitamin D3 (CALCIUM 600-D) 600-400 MG-UNIT TABS Take 1 tablet by mouth 2 (two) times daily.  . cholecalciferol (VITAMIN D3) 25 MCG (1000 UNIT) tablet Take  1,000 Units by mouth daily.  Marland Kitchen ezetimibe (ZETIA) 10 MG tablet Take 10 mg by mouth every evening.   . gabapentin (NEURONTIN) 600 MG tablet Take 600 mg by mouth 3 (three) times daily.  Marland Kitchen HUMALOG MIX 75/25 KWIKPEN (75-25) 100 UNIT/ML Kwikpen Inject 20 Units into the skin 2 (two) times daily. PT DOES 20U IN TID  . JARDIANCE 25 MG TABS tablet Take 25 mg by mouth daily.  . metFORMIN (GLUCOPHAGE) 500 MG tablet Take 1,000 mg by mouth 2 (two) times daily.  . Multiple Vitamins-Minerals (PRESERVISION AREDS 2) CAPS Take 1 capsule by mouth 2 (two) times daily.   . rosuvastatin (CRESTOR) 40 MG tablet Take 40 mg by mouth every evening.   . vitamin B-12 (CYANOCOBALAMIN) 1000 MCG tablet Take 1,000 mcg by mouth daily.     Allergies:   Patient has no known allergies.   Social History   Socioeconomic History  . Marital status: Widowed    Spouse name: Not on file  . Number of children: Not on file  . Years of education: Not on file  . Highest education level: Not on file  Occupational History  . Not on file  Tobacco Use  . Smoking status: Former Smoker    Packs/day: 0.25    Years: 30.00    Pack years: 7.50    Types: Cigarettes    Start date: 03/30/1962    Quit date: 11/12/1991    Years since quitting: 28.9  . Smokeless tobacco: Never Used  Vaping Use  . Vaping Use: Never used  Substance and Sexual Activity  . Alcohol use: No    Alcohol/week: 0.0 standard drinks  . Drug use: No  . Sexual activity: Not Currently    Birth control/protection: Post-menopausal  Other Topics Concern  . Not on file  Social History Narrative   2 sons, one adopted   Social Determinants of Health   Financial Resource Strain:   . Difficulty of Paying Living Expenses: Not on file  Food Insecurity:   . Worried About Charity fundraiser in the Last Year: Not on file  . Ran Out of Food in the Last Year: Not on file  Transportation Needs:   . Lack of Transportation (Medical): Not on file  . Lack of Transportation  (Non-Medical): Not on file  Physical Activity:   . Days of Exercise per Week: Not on file  . Minutes of Exercise per Session: Not on file  Stress:   . Feeling of Stress : Not on file  Social Connections:   . Frequency of Communication with Friends and Family: Not on file  . Frequency of Social Gatherings with Friends and Family: Not on file  . Attends Religious Services: Not on file  . Active Member of Clubs or Organizations: Not on file  . Attends Archivist Meetings: Not on file  . Marital Status: Not on file     Family History: The patient's family history includes Diabetes in her father; Heart disease in her father and mother; Heart failure in her father and mother; Obesity in her  brother.  ROS:   Please see the history of present illness.    All other systems reviewed and are negative.  EKGs/Labs/Other Studies Reviewed:    The following studies were reviewed today: I discussed my findings with the patient at length.   Recent Labs: 02/16/2020: ALT 18 09/13/2020: Magnesium 1.9 09/19/2020: BUN 23; Creatinine, Ser 0.83; Hemoglobin 12.5; Platelets 289; Potassium 4.6; Sodium 140  Recent Lipid Panel No results found for: CHOL, TRIG, HDL, CHOLHDL, VLDL, LDLCALC, LDLDIRECT  Physical Exam:    VS:  BP 134/86   Pulse 82   Ht 5\' 10"  (1.778 m)   Wt 214 lb (97.1 kg)   LMP  (LMP Unknown)   SpO2 96%   BMI 30.71 kg/m     Wt Readings from Last 3 Encounters:  10/17/20 214 lb (97.1 kg)  09/19/20 214 lb (97.1 kg)  09/13/20 207 lb (93.9 kg)     GEN: Patient is in no acute distress HEENT: Normal NECK: No JVD; No carotid bruits LYMPHATICS: No lymphadenopathy CARDIAC: Hear sounds irregular, 2/6 systolic murmur at the apex. RESPIRATORY:  Clear to auscultation without rales, wheezing or rhonchi  ABDOMEN: Soft, non-tender, non-distended MUSCULOSKELETAL:  No edema; No deformity  SKIN: Warm and dry NEUROLOGIC:  Alert and oriented x 3 PSYCHIATRIC:  Normal affect    Signed, Jenean Lindau, MD  10/17/2020 2:06 PM    [place]

## 2020-10-17 NOTE — Telephone Encounter (Signed)
4 boxes of samples of Eliquis given to the pt per Dr. Geraldo Pitter. Lot # [id number] Exp. 8/23

## 2020-10-17 NOTE — Patient Instructions (Signed)

## 2020-10-18 LAB — FECAL OCCULT BLOOD, IMMUNOCHEMICAL: Fecal Occult Bld: NEGATIVE

## 2020-10-23 ENCOUNTER — Ambulatory Visit
Admission: RE | Admit: 2020-10-23 | Discharge: 2020-10-23 | Disposition: A | Discharge status: Home / Self Care | Payer: Medicare Other | Source: Ambulatory Visit | Attending: Internal Medicine | Admitting: Internal Medicine

## 2020-10-23 ENCOUNTER — Other Ambulatory Visit: Payer: Self-pay | Admitting: Internal Medicine

## 2020-10-23 DIAGNOSIS — T1490XA Injury, unspecified, initial encounter: Secondary | ICD-10-CM

## 2021-01-23 ENCOUNTER — Ambulatory Visit: Payer: Medicare Other | Admitting: Cardiology

## 2021-02-12 DIAGNOSIS — E1142 Type 2 diabetes mellitus with diabetic polyneuropathy: Secondary | ICD-10-CM

## 2021-02-12 DIAGNOSIS — Z86010 Personal history of colonic polyps: Secondary | ICD-10-CM | POA: Insufficient documentation

## 2021-02-12 DIAGNOSIS — Z860101 Personal history of adenomatous and serrated colon polyps: Secondary | ICD-10-CM

## 2021-02-12 DIAGNOSIS — E11319 Type 2 diabetes mellitus with unspecified diabetic retinopathy without macular edema: Secondary | ICD-10-CM

## 2021-02-12 DIAGNOSIS — Z794 Long term (current) use of insulin: Secondary | ICD-10-CM

## 2021-02-12 DIAGNOSIS — Z853 Personal history of malignant neoplasm of breast: Secondary | ICD-10-CM | POA: Insufficient documentation

## 2021-02-12 DIAGNOSIS — I872 Venous insufficiency (chronic) (peripheral): Secondary | ICD-10-CM

## 2021-02-12 DIAGNOSIS — I4891 Unspecified atrial fibrillation: Secondary | ICD-10-CM | POA: Insufficient documentation

## 2021-02-12 HISTORY — DX: Personal history of malignant neoplasm of breast: Z85.3

## 2021-02-12 HISTORY — DX: Type 2 diabetes mellitus with unspecified diabetic retinopathy without macular edema: E11.319

## 2021-02-12 HISTORY — DX: Unspecified atrial fibrillation: I48.91

## 2021-02-12 HISTORY — DX: Personal history of adenomatous and serrated colon polyps: Z86.0101

## 2021-02-12 HISTORY — DX: Type 2 diabetes mellitus with diabetic polyneuropathy: E11.42

## 2021-02-12 HISTORY — DX: Personal history of colonic polyps: Z86.010

## 2021-02-12 HISTORY — DX: Venous insufficiency (chronic) (peripheral): I87.2

## 2021-02-12 HISTORY — DX: Long term (current) use of insulin: Z79.4

## 2021-02-13 ENCOUNTER — Other Ambulatory Visit: Payer: Self-pay

## 2021-02-13 ENCOUNTER — Encounter: Payer: Self-pay | Admitting: Cardiology

## 2021-02-13 ENCOUNTER — Ambulatory Visit (INDEPENDENT_AMBULATORY_CARE_PROVIDER_SITE_OTHER): Payer: Medicare Other | Admitting: Cardiology

## 2021-02-13 VITALS — BP 126/64 | HR 86 | Ht 70.0 in | Wt 214.0 lb

## 2021-02-13 DIAGNOSIS — E7800 Pure hypercholesterolemia, unspecified: Secondary | ICD-10-CM | POA: Diagnosis not present

## 2021-02-13 DIAGNOSIS — E088 Diabetes mellitus due to underlying condition with unspecified complications: Secondary | ICD-10-CM

## 2021-02-13 DIAGNOSIS — I4891 Unspecified atrial fibrillation: Secondary | ICD-10-CM | POA: Diagnosis not present

## 2021-02-13 DIAGNOSIS — I10 Essential (primary) hypertension: Secondary | ICD-10-CM | POA: Diagnosis not present

## 2021-02-13 NOTE — Patient Instructions (Signed)
Medication Instructions:  No medication changes. *If you need a refill on your cardiac medications before your next appointment, please call your pharmacy*   Lab Work: Your physician recommends that you return for lab work in: the next few days. You need to have labs done when you are fasting.  You can come Monday through Friday 8:30 am to 12:00 pm and 1:15 to 4:30. You do not need to make an appointment as the order has already been placed. The labs you are going to have done are BMET, CBC, TSH, LFT and Lipids. If you have labs (blood work) drawn today and your tests are completely normal, you will receive your results only by: Marland Kitchen MyChart Message (if you have MyChart) OR . A paper copy in the mail If you have any lab test that is abnormal or we need to change your treatment, we will call you to review the results.   Testing/Procedures: None ordered   Follow-Up: At [facility], you and your health needs are our priority.  As part of our continuing mission to provide you with exceptional heart care, we have created designated Provider Care Teams.  These Care Teams include your primary Cardiologist (physician) and Advanced Practice Providers (APPs -  Physician Assistants and Nurse Practitioners) who all work together to provide you with the care you need, when you need it.  We recommend signing up for the patient portal called "MyChart".  Sign up information is provided on this After Visit Summary.  MyChart is used to connect with patients for Virtual Visits (Telemedicine).  Patients are able to view lab/test results, encounter notes, upcoming appointments, etc.  Non-urgent messages can be sent to your provider as well.   To learn more about what you can do with MyChart, go to [email or web address].    Your next appointment:   4 month(s)  The format for your next appointment:   In Person  Provider:   Jyl Heinz, MD   Other Instructions NA

## 2021-02-13 NOTE — Progress Notes (Signed)
Cardiology Office Note:    Date:  02/13/2021   ID:  Vickie, Burns 1941-07-03, MRN [id number]  PCP:  Lajean Manes, MD  Cardiologist:  Jenean Lindau, MD   Referring MD: Lajean Manes, MD    ASSESSMENT:    1. Atrial fibrillation, unspecified type ([place])   2. Essential hypertension   3. Hypercholesteremia   4. Diabetes mellitus due to underlying condition with unspecified complications ([place])    PLAN:    In order of problems listed above:  1. Primary prevention stressed with the patient.  Importance of compliance with diet medication stressed and she vocalized understanding.  Advised her to walk on a regular basis at least half an hour a day and she promises to do so. 2. Essential hypertension: Blood pressure stable and diet was emphasized.  Lifestyle modification urged 3. Paroxysmal atrial fibrillation:I discussed with the patient atrial fibrillation, disease process. Management and therapy including rate and rhythm control, anticoagulation benefits and potential risks were discussed extensively with the patient. Patient had multiple questions which were answered to patient's satisfaction. 4. Diabetes mellitus and mixed dyslipidemia: Diet as mentioned.  I told her to come back in the next few days for blood work.  She has not had blood work for some time and so we will do hemoglobin A1c. 5. Patient will be seen in follow-up appointment in 6 months or earlier if the patient has any concerns    Medication Adjustments/Labs and Tests Ordered: Current medicines are reviewed at length with the patient today.  Concerns regarding medicines are outlined above.  Orders Placed This Encounter  Procedures  . Basic metabolic panel  . CBC with Differential/Platelet  . Hepatic function panel  . Lipid panel  . TSH  . VITAMIN D 25 Hydroxy (Vit-D Deficiency, Fractures)  . Hemoglobin A1c   No orders of the defined types were placed in this encounter.    No chief complaint on  file.    History of Present Illness:    Vickie Burns is a 80 y.o. female.  Patient has past medical history of paroxysmal atrial fibrillation, essential hypertension and dyslipidemia.  She denies any problems at this time and takes care of activities of daily living.  No chest pain orthopnea or PND.  At the time of my evaluation, the patient is alert awake oriented and in no distress.  Past Medical History:  Diagnosis Date  . Breast CA ([place]) 04/2009   right - radiation and lumpectomy  . Diabetes mellitus due to underlying condition with unspecified complications ([place]) 3/57/0177  . Diabetes mellitus, type 2 ([place]) 01/27/2012  . Essential hypertension 10/07/2016  . Family history of breast cancer    aunt  . HLD (hyperlipidemia) 08/17/2012  . Hypercholesteremia 2000  . Hypertension    history  . 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 New onset a-fib ([place]) 06/28/2020  . Osteoporosis 09/06/2016  . Personal history of radiation therapy   . Postmenopausal    took HRT 1999 - 2004  . Preop cardiovascular exam 07/03/2020  . Unspecified atrial fibrillation ([place]) 07/03/2020    Past Surgical History:  Procedure Laterality Date  . BREAST BIOPSY    . BREAST LUMPECTOMY  04/2009   rt breast  estrogen +, Her 2 Nu negative  . BREAST LUMPECTOMY WITH RADIOACTIVE SEED LOCALIZATION Left 09/07/2020   Procedure: RADIOCATIVE SEED GUIDED LEFT BREAST LUMPECTOMY;  Surgeon: Alphonsa Overall, MD;  Location: [facility];  Service: General;  Laterality: Left;  .  COLONOSCOPY WITH PROPOFOL N/A 04/05/2014   Procedure: COLONOSCOPY WITH PROPOFOL;  Surgeon: Garlan Fair, MD;  [facility];  Service: Endoscopy;  Laterality: N/A;  . ORIF FOOT FRACTURE Right 04/20/2014   done in [place]  . WISDOM TOOTH EXTRACTION      Current Medications: Current Meds  Medication Sig  . apixaban (ELIQUIS) 5 MG TABS tablet Take 5 mg by mouth 2 (two) times daily.  . Calcium Carbonate-Vitamin D3  600-400 MG-UNIT TABS Take 1 tablet by mouth 2 (two) times daily.  . cholecalciferol (VITAMIN D3) 25 MCG (1000 UNIT) tablet Take 1,000 Units by mouth daily.  Marland Kitchen ezetimibe (ZETIA) 10 MG tablet Take 10 mg by mouth every evening.  . gabapentin (NEURONTIN) 600 MG tablet Take 600 mg by mouth 3 (three) times daily.  Marland Kitchen HUMALOG MIX 75/25 KWIKPEN (75-25) 100 UNIT/ML Kwikpen Inject 16-20 Units into the skin 3 (three) times daily.  Marland Kitchen JARDIANCE 25 MG TABS tablet Take 25 mg by mouth daily.  . metFORMIN (GLUCOPHAGE) 500 MG tablet Take 1,000 mg by mouth 2 (two) times daily.  . Multiple Vitamins-Minerals (PRESERVISION AREDS 2) CAPS Take 1 capsule by mouth 2 (two) times daily.   . rosuvastatin (CRESTOR) 40 MG tablet Take 40 mg by mouth every evening.  . vitamin B-12 (CYANOCOBALAMIN) 1000 MCG tablet Take 1,000 mcg by mouth daily.     Allergies:   Patient has no known allergies.   Social History   Socioeconomic History  . Marital status: Widowed    Spouse name: Not on file  . Number of children: Not on file  . Years of education: Not on file  . Highest education level: Not on file  Occupational History  . Not on file  Tobacco Use  . Smoking status: Former Smoker    Packs/day: 0.25    Years: 30.00    Pack years: 7.50    Types: Cigarettes    Start date: 03/30/1962    Quit date: 11/12/1991    Years since quitting: 29.2  . Smokeless tobacco: Never Used  Vaping Use  . Vaping Use: Never used  Substance and Sexual Activity  . Alcohol use: No    Alcohol/week: 0.0 standard drinks  . Drug use: No  . Sexual activity: Not Currently    Birth control/protection: Post-menopausal  Other Topics Concern  . Not on file  Social History Narrative   2 sons, one adopted   Social Determinants of Health   Financial Resource Strain: Not on file  Food Insecurity: Not on file  Transportation Needs: Not on file  Physical Activity: Not on file  Stress: Not on file  Social Connections: Not on file     Family  History: The patient's family history includes Diabetes in her father; Heart disease in her father and mother; Heart failure in her father and mother; Obesity in her brother.  ROS:   Please see the history of present illness.    All other systems reviewed and are negative.  EKGs/Labs/Other Studies Reviewed:    The following studies were reviewed today: I discussed my findings with the patient at length.   Recent Labs: 02/16/2020: ALT 18 09/13/2020: Magnesium 1.9 09/19/2020: BUN 23; Creatinine, Ser 0.83; Hemoglobin 12.5; Platelets 289; Potassium 4.6; Sodium 140  Recent Lipid Panel No results found for: CHOL, TRIG, HDL, CHOLHDL, VLDL, LDLCALC, LDLDIRECT  Physical Exam:    VS:  BP 126/64   Pulse 86   Ht 5\' 10"  (1.778 m)   Wt 214 lb (97.1  kg)   LMP  (LMP Unknown)   SpO2 98%   BMI 30.71 kg/m     Wt Readings from Last 3 Encounters:  02/13/21 214 lb (97.1 kg)  10/17/20 214 lb (97.1 kg)  09/19/20 214 lb (97.1 kg)     GEN: Patient is in no acute distress HEENT: Normal NECK: No JVD; No carotid bruits LYMPHATICS: No lymphadenopathy CARDIAC: Hear sounds regular, 2/6 systolic murmur at the apex. RESPIRATORY:  Clear to auscultation without rales, wheezing or rhonchi  ABDOMEN: Soft, non-tender, non-distended MUSCULOSKELETAL:  No edema; No deformity  SKIN: Warm and dry NEUROLOGIC:  Alert and oriented x 3 PSYCHIATRIC:  Normal affect   Signed, Jenean Lindau, MD  02/13/2021 1:44 PM    [facility]

## 2021-02-15 ENCOUNTER — Inpatient Hospital Stay (HOSPITAL_BASED_OUTPATIENT_CLINIC_OR_DEPARTMENT_OTHER): Payer: Medicare Other | Admitting: Hematology & Oncology

## 2021-02-15 ENCOUNTER — Inpatient Hospital Stay: Payer: Medicare Other | Attending: Hematology & Oncology

## 2021-02-15 ENCOUNTER — Other Ambulatory Visit: Payer: Self-pay

## 2021-02-15 ENCOUNTER — Inpatient Hospital Stay: Payer: Medicare Other

## 2021-02-15 VITALS — BP 134/71 | HR 79 | Temp 98.2°F | Resp 18 | Wt 217.0 lb

## 2021-02-15 DIAGNOSIS — M818 Other osteoporosis without current pathological fracture: Secondary | ICD-10-CM | POA: Insufficient documentation

## 2021-02-15 DIAGNOSIS — C50011 Malignant neoplasm of nipple and areola, right female breast: Secondary | ICD-10-CM

## 2021-02-15 DIAGNOSIS — Z853 Personal history of malignant neoplasm of breast: Secondary | ICD-10-CM | POA: Insufficient documentation

## 2021-02-15 DIAGNOSIS — Z923 Personal history of irradiation: Secondary | ICD-10-CM | POA: Insufficient documentation

## 2021-02-15 DIAGNOSIS — M8000XD Age-related osteoporosis with current pathological fracture, unspecified site, subsequent encounter for fracture with routine healing: Secondary | ICD-10-CM

## 2021-02-15 LAB — CMP (CANCER CENTER ONLY)
ALT: 13 U/L (ref 0–44)
AST: 18 U/L (ref 15–41)
Albumin: 4 g/dL (ref 3.5–5.0)
Alkaline Phosphatase: 46 U/L (ref 38–126)
Anion gap: 7 (ref 5–15)
BUN: 28 mg/dL — ABNORMAL HIGH (ref 8–23)
CO2: 27 mmol/L (ref 22–32)
Calcium: 10.1 mg/dL (ref 8.9–10.3)
Chloride: 104 mmol/L (ref 98–111)
Creatinine: 1.1 mg/dL — ABNORMAL HIGH (ref 0.44–1.00)
GFR, Estimated: 51 mL/min — ABNORMAL LOW (ref >60)
Glucose, Bld: 156 mg/dL — ABNORMAL HIGH (ref 70–99)
Potassium: 5.1 mmol/L (ref 3.5–5.1)
Sodium: 138 mmol/L (ref 135–145)
Total Bilirubin: 0.6 mg/dL (ref 0.3–1.2)
Total Protein: 6.5 g/dL (ref 6.5–8.1)

## 2021-02-15 LAB — CBC WITH DIFFERENTIAL (CANCER CENTER ONLY)
Abs Immature Granulocytes: 0.03 10*3/uL (ref 0.00–0.07)
Basophils Absolute: 0.1 10*3/uL (ref 0.0–0.1)
Basophils Relative: 1 %
Eosinophils Absolute: 0.2 10*3/uL (ref 0.0–0.5)
Eosinophils Relative: 2 %
HCT: 39.8 % (ref 36.0–46.0)
Hemoglobin: 12.6 g/dL (ref 12.0–15.0)
Immature Granulocytes: 0 %
Lymphocytes Relative: 18 %
Lymphs Abs: 1.4 10*3/uL (ref 0.7–4.0)
MCH: 27 pg (ref 26.0–34.0)
MCHC: 31.7 g/dL (ref 30.0–36.0)
MCV: 85.2 fL (ref 80.0–100.0)
Monocytes Absolute: 0.6 10*3/uL (ref 0.1–1.0)
Monocytes Relative: 8 %
Neutro Abs: 5.6 10*3/uL (ref 1.7–7.7)
Neutrophils Relative %: 71 %
Platelet Count: 245 10*3/uL (ref 150–400)
RBC: 4.67 MIL/uL (ref 3.87–5.11)
RDW: 15.5 % (ref 11.5–15.5)
WBC Count: 7.8 10*3/uL (ref 4.0–10.5)
nRBC: 0 % (ref 0.0–0.2)

## 2021-02-15 MED ORDER — ZOLEDRONIC ACID 4 MG/100ML IV SOLN
4.0000 mg | Freq: Once | INTRAVENOUS | Status: AC
  Administered 2021-02-15: 4 mg via INTRAVENOUS
  Filled 2021-02-15: qty 100

## 2021-02-15 NOTE — Progress Notes (Signed)
Hematology and Oncology Follow Up Visit  Vickie Burns [id number] [birth year] 80 y.o. 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  The big news is that she had a biopsy of a left breast lesion.  This was done in October 2021.  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  There was some fibrocystic changes with usual with usual ductal hyperplasia.  There is no malignancy.  She is doing okay.  Her blood sugars are getting better.  She does have issues with diabetes.  She has had no problems with nausea or vomiting.  There has been no actual changes in bowel or bladder habits.  She has had no problems with rashes.  There is no leg swelling.  She has had no headache.  Overall, her performance status is ECOG 1.    Medications:  Allergies as of 02/15/2021   No Known Allergies     Medication List       Accurate as of February 15, 2021  2:36 PM. If you have any questions, ask your nurse or doctor.        apixaban 5 MG Tabs tablet Commonly known as: ELIQUIS Take 5 mg by mouth 2 (two) times daily.   Calcium Carbonate-Vitamin D3 600-400 MG-UNIT Tabs Take 1 tablet by mouth 2 (two) times daily.   cholecalciferol 25 MCG (1000 UNIT) tablet Commonly known as: VITAMIN D3 Take 1,000 Units by mouth daily.   ezetimibe 10 MG tablet Commonly known as: ZETIA Take 10 mg by mouth every evening.   gabapentin 600 MG tablet Commonly known as: NEURONTIN Take 600 mg by mouth 3 (three) times daily.   HUMALOG MIX 50/50 [place] Inject 16-20 Units into the skin 3 (three) times daily. What changed: Another medication with the same name was removed. Continue taking this medication, and follow the directions you see here. Changed by: Volanda Napoleon, MD   Jardiance 25 MG Tabs tablet Generic drug: empagliflozin Take 25 mg by mouth daily.   metFORMIN 500 MG tablet Commonly known as: GLUCOPHAGE Take 1,000 mg by mouth 2 (two) times daily.   PreserVision AREDS 2 Caps Take 1 capsule by mouth 2 (two) times daily.   rosuvastatin 40 MG tablet Commonly known as: CRESTOR Take 40 mg by mouth every evening.   vitamin B-12 1000 MCG tablet Commonly known as: CYANOCOBALAMIN Take 1,000 mcg by mouth daily.       Allergies: No Known Allergies  Past Medical History, Surgical history, Social history, and Family History were reviewed and updated.  Review of Systems: Review of Systems  Constitutional: Negative.   HENT: Negative.   Eyes: Negative.   Respiratory: Negative.   Cardiovascular: Negative.   Gastrointestinal: Negative.   Genitourinary: Negative.   Musculoskeletal: Negative.   Skin: Negative.   Neurological: Negative.   Endo/Heme/Allergies: Bruises/bleeds easily.  Psychiatric/Behavioral: Negative.    Marland Kitchen   Physical Exam:  weight is 217 lb (98.4 kg). Her oral temperature is 98.2 F (36.8 C). Her blood pressure is 134/71 and her pulse is 79. Her respiration is 18 and oxygen saturation is 100%.   Wt Readings from Last 3 Encounters:  02/15/21 217 lb (98.4 kg)  02/13/21 214 lb (97.1 kg)  10/17/20 214 lb (97.1  kg)    Physical Exam Vitals reviewed.  Constitutional:      Comments: Her breast exam shows the right breast be slightly contracted from surgery and radiation.  She has the lumpectomy scar at the edge of the areola at about the 11 o'clock position.  There is no mass in the right breast.  There is no right axillary adenopathy.  Left breast shows the lumpectomy scar at the edge of the areola at about the 1 o'clock position.  There is no mass, edema or erythema in the left breast.  There is no left axillary adenopathy.  HENT:     Head: Normocephalic and atraumatic.  Eyes:     Pupils: Pupils are equal, round, and  reactive to light.  Cardiovascular:     Rate and Rhythm: Normal rate and regular rhythm.     Heart sounds: Normal heart sounds.  Pulmonary:     Effort: Pulmonary effort is normal.     Breath sounds: Normal breath sounds.  Abdominal:     General: Bowel sounds are normal.     Palpations: Abdomen is soft.  Musculoskeletal:        General: No tenderness or deformity. Normal range of motion.     Cervical back: Normal range of motion.     Comments: Extremities shows a substantial ecchymoses in the left lower leg.  This is below the knee.  There is no obvious hematoma.  She has good circulation in her distal extremities.  Lymphadenopathy:     Cervical: No cervical adenopathy.  Skin:    General: Skin is warm and dry.     Findings: No erythema or rash.  Neurological:     Mental Status: She is alert and oriented to person, place, and time.  Psychiatric:        Behavior: Behavior normal.        Thought Content: Thought content normal.        Judgment: Judgment normal.      Lab Results  Component Value Date   WBC 7.8 02/15/2021   HGB 12.6 02/15/2021   HCT 39.8 02/15/2021   MCV 85.2 02/15/2021   PLT 245 02/15/2021   No results found for: FERRITIN, IRON, TIBC, UIBC, IRONPCTSAT Lab Results  Component Value Date   RBC 4.67 02/15/2021   No results found for: KPAFRELGTCHN, LAMBDASER, KAPLAMBRATIO No results found for: IGGSERUM, IGA, IGMSERUM No results found for: Odetta Pink, SPEI   Chemistry      Component Value Date/Time   NA 138 02/15/2021 1318   NA 140 09/19/2020 1444   NA 144 09/26/2017 1143   NA 138 09/29/2015 1410   K 5.1 02/15/2021 1318   K 4.3 09/26/2017 1143   K 4.5 09/29/2015 1410   CL 104 02/15/2021 1318   CL 106 09/26/2017 1143   CO2 27 02/15/2021 1318   CO2 27 09/26/2017 1143   CO2 27 09/29/2015 1410   BUN 28 (H) 02/15/2021 1318   BUN 23 09/19/2020 1444   BUN 11 09/26/2017 1143   BUN 17.4 09/29/2015 1410    CREATININE 1.10 (H) 02/15/2021 1318   CREATININE 1.1 09/26/2017 1143   CREATININE 1.0 09/29/2015 1410      Component Value Date/Time   CALCIUM 10.1 02/15/2021 1318   CALCIUM 10.0 09/26/2017 1143   CALCIUM 9.5 09/29/2015 1410   ALKPHOS 46 02/15/2021 1318   ALKPHOS 48 09/26/2017 1143   ALKPHOS 51 09/29/2015 1410   AST 18 02/15/2021  1318   AST 18 09/29/2015 1410   ALT 13 02/15/2021 1318   ALT 23 09/26/2017 1143   ALT 15 09/29/2015 1410   BILITOT 0.6 02/15/2021 1318   BILITOT 0.37 09/29/2015 1410      Impression and Plan: Ms. Terrones is a very pleasant 80 yo caucasian female with history of stage I infiltrating ductal carcinoma of the right breast.    She was treated with lumpectomy and radiation.   She completed 5 years of Femara in October 2015. She continues to do well and so far there has been no evidence of recurrence.   We will proceed with Zometa today as planned.   We will plan to see her back again in another year for follow-up, lab and Zometa infusion.   Marland Kitchen   Volanda Napoleon, MD 4/7/20222:36 PM

## 2021-02-15 NOTE — Patient Instructions (Signed)
Zoledronic Acid Injection (Hypercalcemia, Oncology) What is this medicine? ZOLEDRONIC ACID (ZOE le dron ik AS id) slows calcium loss from bones. It high calcium levels in the blood from some kinds of cancer. It may be used in other people at risk for bone loss. This medicine may be used for other purposes; ask your health care provider or pharmacist if you have questions. COMMON BRAND NAME(S): Zometa What should I tell my health care provider before I take this medicine? They need to know if you have any of these conditions:  cancer  dehydration  dental disease  kidney disease  liver disease  low levels of calcium in the blood  lung or breathing disease (asthma)  receiving steroids like dexamethasone or prednisone  an unusual or allergic reaction to zoledronic acid, other medicines, foods, dyes, or preservatives  pregnant or trying to get pregnant  breast-feeding How should I use this medicine? This drug is injected into a vein. It is given by a health care provider in a hospital or clinic setting. Talk to your health care provider about the use of this drug in children. Special care may be needed. Overdosage: If you think you have taken too much of this medicine contact a poison control center or emergency room at once. NOTE: This medicine is only for you. Do not share this medicine with others. What if I miss a dose? Keep appointments for follow-up doses. It is important not to miss your dose. Call your health care provider if you are unable to keep an appointment. What may interact with this medicine?  certain antibiotics given by injection  NSAIDs, medicines for pain and inflammation, like ibuprofen or naproxen  some diuretics like bumetanide, furosemide  teriparatide  thalidomide This list may not describe all possible interactions. Give your health care provider a list of all the medicines, herbs, non-prescription drugs, or dietary supplements you use. Also tell  them if you smoke, drink alcohol, or use illegal drugs. Some items may interact with your medicine. What should I watch for while using this medicine? Visit your health care provider for regular checks on your progress. It may be some time before you see the benefit from this drug. Some people who take this drug have severe bone, joint, or muscle pain. This drug may also increase your risk for jaw problems or a broken thigh bone. Tell your health care provider right away if you have severe pain in your jaw, bones, joints, or muscles. Tell you health care provider if you have any pain that does not go away or that gets worse. Tell your dentist and dental surgeon that you are taking this drug. You should not have major dental surgery while on this drug. See your dentist to have a dental exam and fix any dental problems before starting this drug. Take good care of your teeth while on this drug. Make sure you see your dentist for regular follow-up appointments. You should make sure you get enough calcium and vitamin D while you are taking this drug. Discuss the foods you eat and the vitamins you take with your health care provider. Check with your health care provider if you have severe diarrhea, nausea, and vomiting, or if you sweat a lot. The loss of too much body fluid may make it dangerous for you to take this drug. You may need blood work done while you are taking this drug. Do not become pregnant while taking this drug. Women should inform their health care provider   if they wish to become pregnant or think they might be pregnant. There is potential for serious harm to an unborn child. Talk to your health care provider for more information. What side effects may I notice from receiving this medicine? Side effects that you should report to your doctor or health care provider as soon as possible:  allergic reactions (skin rash, itching or hives; swelling of the face, lips, or tongue)  bone  pain  infection (fever, chills, cough, sore throat, pain or trouble passing urine)  jaw pain, especially after dental work  joint pain  kidney injury (trouble passing urine or change in the amount of urine)  low blood pressure (dizziness; feeling faint or lightheaded, falls; unusually weak or tired)  low calcium levels (fast heartbeat; muscle cramps or pain; pain, tingling, or numbness in the hands or feet; seizures)  low magnesium levels (fast, irregular heartbeat; muscle cramp or pain; muscle weakness; tremors; seizures)  low red blood cell counts (trouble breathing; feeling faint; lightheaded, falls; unusually weak or tired)  muscle pain  redness, blistering, peeling, or loosening of the skin, including inside the mouth  severe diarrhea  swelling of the ankles, feet, hands  trouble breathing Side effects that usually do not require medical attention (report to your doctor or health care provider if they continue or are bothersome):  anxious  constipation  coughing  depressed mood  eye irritation, itching, or pain  fever  general ill feeling or flu-like symptoms  nausea  pain, redness, or irritation at site where injected  trouble sleeping This list may not describe all possible side effects. Call your doctor for medical advice about side effects. You may report side effects to FDA at 1-800-FDA-1088. Where should I keep my medicine? This drug is given in a hospital or clinic. It will not be stored at home. NOTE: This sheet is a summary. It may not cover all possible information. If you have questions about this medicine, talk to your doctor, pharmacist, or health care provider.  2021 Elsevier/Gold Standard (2019-08-12 09:13:00)  

## 2021-02-19 ENCOUNTER — Telehealth: Payer: Self-pay | Admitting: Hematology & Oncology

## 2021-02-19 NOTE — Telephone Encounter (Signed)
Appointments scheduled calendar printed & mailed per 4/7 los 

## 2021-02-21 ENCOUNTER — Telehealth: Payer: Self-pay | Admitting: Adult Health

## 2021-02-21 ENCOUNTER — Telehealth: Payer: Self-pay

## 2021-02-21 NOTE — Telephone Encounter (Signed)
Called to discuss with patient about COVID-19 symptoms and the use of one of the available treatments for those with mild to moderate Covid symptoms and at a high risk of hospitalization.  Pt appears to qualify for outpatient treatment due to co-morbid conditions and/or a member of an at-risk group in accordance with the FDA Emergency Use Authorization.    Symptom onset: Nasal congestion, headache 02/19/21 Vaccinated: Yes Booster? Yes Immunocompromised? Yes Qualifiers: DM, Breast cancer  Pt. Would like to speak with APP.   Vickie Burns

## 2021-02-21 NOTE — Telephone Encounter (Signed)
Called to discuss with patient about COVID-19 symptoms and the use of one of the available treatments for those with mild to moderate Covid symptoms and at a high risk of hospitalization.  Pt appears to qualify for outpatient treatment due to co-morbid conditions and/or a member of an at-risk group in accordance with the FDA Emergency Use Authorization.    Symptom onset: 4/8 Due to medication list and several interactions, along with sx onset, and slight decrease in GFR recommend Bebeltovimab.  Patient declines this because she does not want to come to [facility] from [facility].  She understands to reach out to her PCP for future needs and thanked me for call.   Scot Dock

## 2021-03-07 ENCOUNTER — Ambulatory Visit: Payer: Medicare Other | Admitting: Obstetrics and Gynecology

## 2021-03-28 ENCOUNTER — Other Ambulatory Visit: Payer: Self-pay

## 2021-03-28 MED ORDER — APIXABAN 5 MG PO TABS: 5.0000 mg | ORAL_TABLET | Freq: Two times a day (BID) | ORAL | 1 refills | Status: DC

## 2021-03-28 NOTE — Telephone Encounter (Signed)
86f, 98.4kg, scr 1.1 02/15/21, lovw/revenkar 02/13/21

## 2021-04-30 ENCOUNTER — Other Ambulatory Visit: Payer: Self-pay | Admitting: Geriatric Medicine

## 2021-04-30 DIAGNOSIS — Z1231 Encounter for screening mammogram for malignant neoplasm of breast: Secondary | ICD-10-CM

## 2021-05-28 ENCOUNTER — Telehealth: Payer: Self-pay | Admitting: Cardiology

## 2021-05-28 NOTE — Telephone Encounter (Signed)
Pt c/o Shortness Of Breath: STAT if SOB developed within the last 24 hours or pt is noticeably SOB on the phone  1. Are you currently SOB (can you hear that pt is SOB on the phone)? No, pt's sugar 104 currently  2. How long have you been experiencing SOB? Started this morning around 6am but she's not currently SOB  3. Are you SOB when sitting or when up moving around? Both, pt is currently at [employer] sitting  4. Are you currently experiencing any other symptoms?  No,

## 2021-05-28 NOTE — Telephone Encounter (Signed)
Spoke to the patient just now and she let me know that she is currently at work. However, this morning when getting ready for work she felt short of breath and had to sit down. She denies any dizziness, lightheadedness, chest pain. She took all of her morning medications. She did not take her vital signs this morning and does not have a way to do so.   She feels better now and is not having any symptoms. I advised that she should keep an eye on her symptoms and should report back to [place] if the SOB were to come back or worsen. I also advised that she should contact her PCP to be evaluated for this.    Encouraged patient to call back with any questions or concerns.

## 2021-05-30 ENCOUNTER — Telehealth: Payer: Self-pay | Admitting: Cardiology

## 2021-05-30 NOTE — Telephone Encounter (Signed)
Pt c/o Shortness Of Breath: STAT if SOB developed within the last 24 hours or pt is noticeably SOB on the phone  1. Are you currently SOB (can you hear that pt is SOB on the phone)? Yes, pt is currently at work. Pt stated she received a callback on 05/28/21 but it was about her Blood sugar not her SOB  2. How long have you been experiencing SOB?  2 days ago  3. Are you SOB when sitting or when up moving around? Both, pt states she has the sob whenever she breathes and if she walks it causes a pain across the middle of her back  4. Are you currently experiencing any other symptoms? No other symptoms

## 2021-05-30 NOTE — Telephone Encounter (Signed)
Spoke to the patient just now and discussed with her that we did discuss her shortness of breath when we spoke on Monday 05/28/2021. She states that she forgot about this conversation. However, she is short of breath upon exertion and is wanting to see Dr. Geraldo Pitter sooner than her appointment in August. I advised her that at this time his soonest available appointment is in [facility] on 06/07/2021 at 10:20 am. She is agreeable to come in and see him on this day.   She saw her PCP yesterday and they advised that she should see Dr. Geraldo Pitter in regards to this. She states that her vital signs were good while at this visit but she is unable to take them at home. I advised her that if her symptoms were to worsen that she should be evaluated either at the urgent care or the emergency room. She verbalizes understanding.

## 2021-06-07 ENCOUNTER — Other Ambulatory Visit: Payer: Self-pay

## 2021-06-07 ENCOUNTER — Ambulatory Visit (HOSPITAL_BASED_OUTPATIENT_CLINIC_OR_DEPARTMENT_OTHER)
Admission: RE | Admit: 2021-06-07 | Discharge: 2021-06-07 | Disposition: A | Discharge status: Home / Self Care | Payer: Medicare Other | Source: Ambulatory Visit | Attending: Cardiology | Admitting: Cardiology

## 2021-06-07 ENCOUNTER — Ambulatory Visit (INDEPENDENT_AMBULATORY_CARE_PROVIDER_SITE_OTHER): Payer: Medicare Other | Admitting: Cardiology

## 2021-06-07 ENCOUNTER — Encounter (HOSPITAL_BASED_OUTPATIENT_CLINIC_OR_DEPARTMENT_OTHER): Payer: Self-pay

## 2021-06-07 ENCOUNTER — Encounter: Payer: Self-pay | Admitting: Cardiology

## 2021-06-07 VITALS — BP 114/78 | HR 79 | Ht 70.0 in | Wt 216.1 lb

## 2021-06-07 DIAGNOSIS — R911 Solitary pulmonary nodule: Secondary | ICD-10-CM

## 2021-06-07 DIAGNOSIS — C50012 Malignant neoplasm of nipple and areola, left female breast: Secondary | ICD-10-CM

## 2021-06-07 DIAGNOSIS — I10 Essential (primary) hypertension: Secondary | ICD-10-CM

## 2021-06-07 DIAGNOSIS — E088 Diabetes mellitus due to underlying condition with unspecified complications: Secondary | ICD-10-CM | POA: Diagnosis not present

## 2021-06-07 DIAGNOSIS — I4819 Other persistent atrial fibrillation: Secondary | ICD-10-CM

## 2021-06-07 DIAGNOSIS — E782 Mixed hyperlipidemia: Secondary | ICD-10-CM

## 2021-06-07 DIAGNOSIS — C50011 Malignant neoplasm of nipple and areola, right female breast: Secondary | ICD-10-CM

## 2021-06-07 HISTORY — DX: Other persistent atrial fibrillation: I48.19

## 2021-06-07 MED ORDER — AMIODARONE HCL 200 MG PO TABS: 200.0000 mg | ORAL_TABLET | Freq: Every day | ORAL | 0 refills | Status: DC

## 2021-06-07 MED ORDER — AMIODARONE HCL 200 MG PO TABS
200.0000 mg | ORAL_TABLET | Freq: Every day | ORAL | 3 refills | Status: DC
Start: 2021-06-07 — End: 2021-11-21

## 2021-06-07 NOTE — Addendum Note (Signed)
Addended by: Truddie Hidden on: 06/07/2021 05:16 PM   Modules accepted: Orders

## 2021-06-07 NOTE — Patient Instructions (Signed)
Medication Instructions:  Your physician has recommended you make the following change in your medication:   Take Amiodarone 400 mg (2 tablets) daily for 2 weeks then decrease to 200 mg (1 tablet) daily.  *If you need a refill on your cardiac medications before your next appointment, please call your pharmacy*   Lab Work: Your physician recommends that you have a BMET, TSH and lft's today.  If you have labs (blood work) drawn today and your tests are completely normal, you will receive your results only by: [place] (if you have MyChart) OR A paper copy in the mail If you have any lab test that is abnormal or we need to change your treatment, we will call you to review the results.   Testing/Procedures: A chest x-ray takes a picture of the organs and structures inside the chest, including the heart, lungs, and blood vessels. This test can show several things, including, whether the heart is enlarges; whether fluid is building up in the lungs; and whether pacemaker / defibrillator leads are still in place.    Follow-Up: At [facility], you and your health needs are our priority.  As part of our continuing mission to provide you with exceptional heart care, we have created designated Provider Care Teams.  These Care Teams include your primary Cardiologist (physician) and Advanced Practice Providers (APPs -  Physician Assistants and Nurse Practitioners) who all work together to provide you with the care you need, when you need it.  We recommend signing up for the patient portal called "MyChart".  Sign up information is provided on this After Visit Summary.  MyChart is used to connect with patients for Virtual Visits (Telemedicine).  Patients are able to view lab/test results, encounter notes, upcoming appointments, etc.  Non-urgent messages can be sent to your provider as well.   To learn more about what you can do with MyChart, go to [email or web address].    Your next  appointment:   1 month(s)  The format for your next appointment:   In Person  Provider:   Jyl Heinz, MD   Other Instructions NA

## 2021-06-07 NOTE — Progress Notes (Signed)
Cardiology Office Note:    Date:  06/07/2021   ID:  Kornelia, Guyon 1941/10/20, MRN [id number]  PCP:  Lajean Manes, MD  Cardiologist:  Jenean Lindau, MD   Referring MD: Lajean Manes, MD    ASSESSMENT:    1. Essential hypertension   2. Persistent atrial fibrillation ([facility])   3. Diabetes mellitus due to underlying condition with unspecified complications ([place])    PLAN:    In order of problems listed above:  Primary prevention stressed with the patient.  Importance of compliance with diet medication stressed and she vocalized understanding. Persistent atrial fibrillation: I reviewed echocardiogram report at length.  In view of some shortness of breath issues I would like to do an rhythm control strategy.  I discussed this with her at length.  I will initiate her on amiodarone 400 mg daily for 2 weeks then 200 mg daily and see her in follow-up appointment in a month.  We will have baseline blood work today and chest x-ray today.  Benefits and potential risks of the medication explained and she vocalized understanding.  Questions were answered to her satisfaction. Essential hypertension: Blood pressure stable and diet was emphasized. Mixed dyslipidemia and diabetes mellitus: She is followed by primary care physician for this. Patient mentions to me that she had a fall several weeks ago.  She tells me that this was a mechanical fall and subsequently she has not had any such issues.  Anticoagulation and fall risks were explained and she promises to be more careful.  Further recommendations will be made based on the follow-up in a month.   Medication Adjustments/Labs and Tests Ordered: Current medicines are reviewed at length with the patient today.  Concerns regarding medicines are outlined above.  No orders of the defined types were placed in this encounter.  No orders of the defined types were placed in this encounter.    No chief complaint on file.    History of  Present Illness:    Vickie Burns is a 80 y.o. female.  Patient has past medical history of essential hypertension, dyslipidemia, diabetes mellitus and persistent atrial fibrillation.  She gives history of some dyspnea on exertion.  Stress testing in the past has been negative.  At the time of my evaluation, the patient is alert awake oriented and in no distress.  She mentions to me that she does not exercise because of shortness of shortness of breath issues.  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   New onset a-fib ([place]) 06/28/2020   Osteoporosis 09/06/2016   Personal history of radiation therapy    Postmenopausal    took HRT 1999 - 2004   Preop cardiovascular exam 07/03/2020   Unspecified atrial fibrillation ([place]) 07/03/2020    Past Surgical History:  Procedure Laterality Date   BREAST BIOPSY     BREAST LUMPECTOMY  04/2009   rt breast  estrogen +, Her 2 Nu negative   BREAST LUMPECTOMY WITH RADIOACTIVE SEED LOCALIZATION Left 09/07/2020   Procedure: RADIOCATIVE SEED GUIDED LEFT BREAST LUMPECTOMY;  Surgeon: Lucia Gaskins,  Shanon Brow, MD;  Location: [facility];  Service: General;  Laterality: Left;   COLONOSCOPY WITH PROPOFOL N/A 04/05/2014   Procedure: COLONOSCOPY WITH PROPOFOL;  Surgeon: Garlan Fair, MD;  [facility];  Service: Endoscopy;  Laterality: N/A;   ORIF FOOT FRACTURE Right 04/20/2014   done in [place] EXTRACTION      Current Medications: Current Meds  Medication Sig   apixaban (ELIQUIS) 5 MG TABS tablet Take 1 tablet (5 mg  total) by mouth 2 (two) times daily.   Calcium Carbonate-Vitamin D3 600-400 MG-UNIT TABS Take 1 tablet by mouth 2 (two) times daily.   cholecalciferol (VITAMIN D3) 25 MCG (1000 UNIT) tablet Take 1,000 Units by mouth daily.   ezetimibe (ZETIA) 10 MG tablet Take 10 mg by mouth every evening.   gabapentin (NEURONTIN) 600 MG tablet Take 600 mg by mouth 3 (three) times daily.   Insulin Lispro Prot & Lispro (HUMALOG MIX 50/50 [place]) Inject 16-20 Units into the skin 3 (three) times daily.   JARDIANCE 25 MG TABS tablet Take 25 mg by mouth daily.   metFORMIN (GLUCOPHAGE) 500 MG tablet Take 1,000 mg by mouth 2 (two) times daily.   Multiple Vitamins-Minerals (PRESERVISION AREDS 2) CAPS Take 1 capsule by mouth 2 (two) times daily.    rosuvastatin (CRESTOR) 40 MG tablet Take 40 mg by mouth every evening.   vitamin B-12 (CYANOCOBALAMIN) 1000 MCG tablet Take 1,000 mcg by mouth daily.     Allergies:   Patient has no known allergies.   Social History   Socioeconomic History   Marital status: Widowed    Spouse name: Not on file   Number of children: Not on file   Years of education: Not on file   Highest education level: Not on file  Occupational History   Not on file  Tobacco Use   Smoking status: Former    Packs/day: 0.25    Years: 30.00    Pack years: 7.50    Types: Cigarettes    Start date: 03/30/1962    Quit date: 11/12/1991    Years since quitting: 29.5   Smokeless tobacco: Never  Vaping Use   Vaping Use: Never used  Substance and Sexual Activity   Alcohol use: No    Alcohol/week: 0.0 standard drinks   Drug use: No   Sexual activity: Not Currently    Birth control/protection: Post-menopausal  Other Topics Concern   Not on file  Social History Narrative   2 sons, one adopted   Social Determinants of Health   Financial Resource Strain: Not on file  Food Insecurity: Not on file  Transportation Needs: Not on file  Physical Activity: Not on file  Stress: Not on file  Social  Connections: Not on file     Family History: The patient's family history includes Diabetes in her father; Heart disease in her father and mother; Heart failure in her father and mother; Obesity in her brother.  ROS:   Please see the history of present illness.    All other systems reviewed and are negative.  EKGs/Labs/Other Studies Reviewed:    The following studies were reviewed today: I discussed my findings with the patient at length.  EKG reveals atrial fibrillation with controlled ventricular rate.   Recent Labs: 09/13/2020: Magnesium 1.9 02/15/2021: ALT 13; BUN 28; Creatinine 1.10; Hemoglobin 12.6; Platelet Count 245; Potassium 5.1; Sodium 138  Recent Lipid Panel No results found for: CHOL, TRIG, HDL, CHOLHDL, VLDL, LDLCALC, LDLDIRECT  Physical Exam:    VS:  BP 114/78 (BP Location: Left Arm, Patient Position: Sitting, Cuff Size: Large)   Pulse 79   Ht '5\' 10"'$  (1.778 m)   Wt 216 lb 1.9 oz (98 kg)   LMP  (LMP Unknown)   SpO2 98%   BMI 31.01 kg/m     Wt Readings from Last 3 Encounters:  06/07/21 216 lb 1.9 oz (98 kg)  02/15/21 217 lb (98.4 kg)  02/13/21 214 lb (97.1 kg)     GEN: Patient is in no acute distress HEENT: Normal NECK: No JVD; No carotid bruits LYMPHATICS: No lymphadenopathy CARDIAC: Hear sounds regular, 2/6 systolic murmur at the apex. RESPIRATORY:  Clear to auscultation without rales, wheezing or rhonchi  ABDOMEN: Soft, non-tender, non-distended MUSCULOSKELETAL:  No edema; No deformity  SKIN: Warm and dry NEUROLOGIC:  Alert and oriented x 3 PSYCHIATRIC:  Normal affect   Signed, Jenean Lindau, MD  06/07/2021 11:04 AM    [place]

## 2021-06-08 LAB — BASIC METABOLIC PANEL
BUN/Creatinine Ratio: 19 (ref 12–28)
BUN: 20 mg/dL (ref 8–27)
CO2: 22 mmol/L (ref 20–29)
Calcium: 10.2 mg/dL (ref 8.7–10.3)
Chloride: 101 mmol/L (ref 96–106)
Creatinine, Ser: 1.04 mg/dL — ABNORMAL HIGH (ref 0.57–1.00)
Glucose: 163 mg/dL — ABNORMAL HIGH (ref 65–99)
Potassium: 4.8 mmol/L (ref 3.5–5.2)
Sodium: 140 mmol/L (ref 134–144)
eGFR: 55 mL/min/{1.73_m2} — ABNORMAL LOW (ref >59)

## 2021-06-08 LAB — HEPATIC FUNCTION PANEL
ALT: 13 IU/L (ref 0–32)
AST: 18 IU/L (ref 0–40)
Albumin: 4.3 g/dL (ref 3.7–4.7)
Alkaline Phosphatase: 59 IU/L (ref 44–121)
Bilirubin Total: 0.4 mg/dL (ref 0.0–1.2)
Bilirubin, Direct: 0.18 mg/dL (ref 0.00–0.40)
Total Protein: 6.2 g/dL (ref 6.0–8.5)

## 2021-06-08 LAB — TSH: TSH: 1.65 u[IU]/mL (ref 0.450–4.500)

## 2021-06-08 NOTE — Addendum Note (Signed)
Addended by: Truddie Hidden on: 06/08/2021 09:06 AM   Modules accepted: Orders

## 2021-06-12 ENCOUNTER — Telehealth: Payer: Self-pay | Admitting: Cardiology

## 2021-06-12 NOTE — Telephone Encounter (Signed)
    Pt requested to speak with Dr. Julien Nordmann nurse again, she said she has questions about CT

## 2021-06-12 NOTE — Telephone Encounter (Signed)
Correction: patient has taken 1 tablet so far.

## 2021-06-12 NOTE — Telephone Encounter (Signed)
Pt c/o medication issue:  1. Name of Medication:  amiodarone (PACERONE) 200 MG tablet  2. How are you currently taking this medication (dosage and times per day)? Patient has not started taking this medication  3. Are you having a reaction (difficulty breathing--STAT)?  No   4. What is your medication issue?  Patient states when initially prescribed this medication she was told to take it twice daily for one week and then once a week. This is not noted in the instructions, so she wants to be sure she will be taking it correctly. Please advise.

## 2021-06-12 NOTE — Telephone Encounter (Signed)
Called patient to let discuss the CT she will have done. She was very concerned because she had not heard anything since her appointment. She thanked me for walking through everything with her.

## 2021-06-12 NOTE — Telephone Encounter (Signed)
Per Dr. Julien Nordmann notes patient is to take Amiodarone 400 mg daily for 2 weeks then 200 mg daily. Spoke with patient she is made aware. She states yes he did tell me that but the bottle didn't say it so I wanted to make sure. She thanked me for calling her, no further questions at this time.

## 2021-06-15 ENCOUNTER — Telehealth: Payer: Self-pay

## 2021-06-15 LAB — LIPID PANEL
Chol/HDL Ratio: 2.4 ratio (ref 0.0–4.4)
Cholesterol, Total: 115 mg/dL (ref 100–199)
HDL: 47 mg/dL (ref >39)
LDL Chol Calc (NIH): 48 mg/dL (ref 0–99)
Triglycerides: 111 mg/dL (ref 0–149)
VLDL Cholesterol Cal: 20 mg/dL (ref 5–40)

## 2021-06-15 NOTE — Telephone Encounter (Signed)
-----   Message from Jenean Lindau, MD sent at 06/15/2021 12:17 PM EDT ----- The results of the study is unremarkable. Please inform patient. I will discuss in detail at next appointment. Cc  primary care/referring physician Jenean Lindau, MD 06/15/2021 12:17 PM

## 2021-06-15 NOTE — Telephone Encounter (Signed)
Spoke with patient regarding results and recommendation.  Patient verbalizes understanding and is agreeable to plan of care. Advised patient to call back with any issues or concerns.  

## 2021-06-27 ENCOUNTER — Ambulatory Visit (HOSPITAL_BASED_OUTPATIENT_CLINIC_OR_DEPARTMENT_OTHER)
Admission: RE | Admit: 2021-06-27 | Discharge: 2021-06-27 | Disposition: A | Discharge status: Home / Self Care | Payer: Medicare Other | Source: Ambulatory Visit | Attending: Cardiology | Admitting: Cardiology

## 2021-06-27 ENCOUNTER — Other Ambulatory Visit: Payer: Self-pay

## 2021-06-27 DIAGNOSIS — C50011 Malignant neoplasm of nipple and areola, right female breast: Secondary | ICD-10-CM | POA: Insufficient documentation

## 2021-06-27 DIAGNOSIS — C50012 Malignant neoplasm of nipple and areola, left female breast: Secondary | ICD-10-CM | POA: Insufficient documentation

## 2021-06-27 DIAGNOSIS — R911 Solitary pulmonary nodule: Secondary | ICD-10-CM | POA: Diagnosis not present

## 2021-06-29 ENCOUNTER — Other Ambulatory Visit: Payer: Self-pay

## 2021-06-29 ENCOUNTER — Ambulatory Visit: Payer: Medicare Other

## 2021-07-02 ENCOUNTER — Ambulatory Visit (INDEPENDENT_AMBULATORY_CARE_PROVIDER_SITE_OTHER): Payer: Medicare Other | Admitting: Cardiology

## 2021-07-02 ENCOUNTER — Ambulatory Visit: Payer: Medicare Other | Admitting: Cardiology

## 2021-07-02 ENCOUNTER — Telehealth: Payer: Self-pay | Admitting: Cardiology

## 2021-07-02 ENCOUNTER — Other Ambulatory Visit: Payer: Self-pay

## 2021-07-02 ENCOUNTER — Encounter: Payer: Self-pay | Admitting: Cardiology

## 2021-07-02 VITALS — BP 102/68 | HR 82 | Ht 70.0 in | Wt 208.1 lb

## 2021-07-02 DIAGNOSIS — I10 Essential (primary) hypertension: Secondary | ICD-10-CM

## 2021-07-02 DIAGNOSIS — I4819 Other persistent atrial fibrillation: Secondary | ICD-10-CM | POA: Diagnosis not present

## 2021-07-02 DIAGNOSIS — E088 Diabetes mellitus due to underlying condition with unspecified complications: Secondary | ICD-10-CM

## 2021-07-02 DIAGNOSIS — I4891 Unspecified atrial fibrillation: Secondary | ICD-10-CM

## 2021-07-02 NOTE — Patient Instructions (Signed)

## 2021-07-02 NOTE — Progress Notes (Signed)
Cardiology Office Note:    Date:  07/02/2021   ID:  Vickie Burns, Vickie Burns 07-26-1941, MRN [id number]  PCP:  Lajean Manes, MD  Cardiologist:  Jenean Lindau, MD   Referring MD: Lajean Manes, MD    ASSESSMENT:    1. Essential hypertension   2. Persistent atrial fibrillation ([place])   3. Diabetes mellitus due to underlying condition with unspecified complications ([place])    PLAN:    In order of problems listed above:  Primary prevention stressed with the patient.  Importance of compliance with diet medication stressed and she vocalized understanding. Persistent atrial fibrillation:I discussed with the patient atrial fibrillation, disease process. Management and therapy including rate and rhythm control, anticoagulation benefits and potential risks were discussed extensively with the patient. Patient had multiple questions which were answered to patient's satisfaction.  I discussed cardioversion but she wants to hold off on it for this time. Essential hypertension: Blood pressure stable.  Her blood pressure is actually borderline and I encouraged her to keep her self well-hydrated with diet with increased salt and water in the diet Mixed dyslipidemia: Lipids were reviewed and discussed with her.  Diet was also discussed keeping diabetes in mind and she is doing well with this.  This is followed by primary care. Patient will be seen in follow-up appointment in 2 months or earlier if the patient has any concerns    Medication Adjustments/Labs and Tests Ordered: Current medicines are reviewed at length with the patient today.  Concerns regarding medicines are outlined above.  Orders Placed This Encounter  Procedures   EKG 12-Lead    No orders of the defined types were placed in this encounter.    No chief complaint on file.    History of Present Illness:    Vickie Burns is a 80 y.o. female.  Patient has past medical history of persistent atrial fibrillation,  essential hypertension and diabetes mellitus.  She denies any problems at this time and takes care of activities of daily living.  No chest pain orthopnea or PND.  Her only issue is that she is feeling tired these days.  At the time of my evaluation, the patient is alert awake oriented and in no distress.  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   New onset a-fib ([place]) 06/28/2020   Osteoporosis 09/06/2016   Personal history of radiation therapy    Postmenopausal    took HRT 1999 - 2004   Preop cardiovascular exam 07/03/2020   Unspecified atrial fibrillation ([place]) 07/03/2020    Past Surgical History:  Procedure Laterality Date   BREAST BIOPSY     BREAST LUMPECTOMY  04/2009   rt breast  estrogen +, Her 2 Nu negative   BREAST LUMPECTOMY WITH RADIOACTIVE SEED LOCALIZATION Left 09/07/2020   Procedure: RADIOCATIVE SEED GUIDED LEFT BREAST LUMPECTOMY;  Surgeon: Alphonsa Overall, MD;  Location: [facility];  Service: General;  Laterality: Left;   COLONOSCOPY WITH PROPOFOL N/A 04/05/2014   Procedure: COLONOSCOPY WITH PROPOFOL;  Surgeon: Garlan Fair, MD;  [facility];  Service: Endoscopy;  Laterality: N/A;   ORIF FOOT FRACTURE Right 04/20/2014   done in [place] EXTRACTION      Current Medications: Current Meds  Medication Sig   amiodarone (PACERONE) 200 MG tablet Take 1 tablet (200 mg total) by mouth daily.   apixaban (ELIQUIS) 5 MG TABS tablet Take 1 tablet (5 mg total) by mouth 2 (two) times daily.   Calcium Carbonate-Vitamin D3 600-400 MG-UNIT  TABS Take 1 tablet by mouth 2 (two) times daily.   cholecalciferol (VITAMIN D3) 25 MCG (1000 UNIT) tablet Take 1,000 Units by mouth daily.   ezetimibe (ZETIA) 10 MG tablet Take 10 mg by mouth every evening.   gabapentin (NEURONTIN) 600 MG tablet Take 600 mg by mouth 3 (three) times daily.   Insulin Lispro Prot & Lispro (HUMALOG MIX 50/50 [place]) Inject 16-20 Units into the skin 3 (three) times daily.   JARDIANCE 25 MG TABS tablet Take 25 mg by mouth daily.   meloxicam (MOBIC) 15 MG tablet Take 15 mg by mouth daily.   metFORMIN (GLUCOPHAGE) 500 MG tablet Take 1,000 mg by mouth 2 (two) times daily.   Multiple Vitamins-Minerals (PRESERVISION AREDS 2) CAPS Take 1 capsule by mouth 2 (two) times daily.    rosuvastatin (CRESTOR) 40 MG tablet Take 40 mg by mouth every evening.   vitamin B-12 (CYANOCOBALAMIN) 1000 MCG tablet Take 1,000 mcg by mouth daily.     Allergies:   Patient has no known allergies.   Social History   Socioeconomic History   Marital status: Widowed    Spouse name: Not on file   Number of children: Not on file   Years of education: Not on file   Highest education level: Not on file  Occupational History   Not on file  Tobacco Use   Smoking status: Former    Packs/day: 0.25    Years: 30.00    Pack years: 7.50    Types: Cigarettes    Start date: 03/30/1962    Quit date: 11/12/1991    Years since quitting: 29.6   Smokeless tobacco: Never  Vaping Use   Vaping Use: Never used  Substance and Sexual Activity   Alcohol use: No    Alcohol/week: 0.0 standard drinks   Drug use: No   Sexual activity: Not Currently    Birth control/protection: Post-menopausal  Other Topics Concern   Not on file  Social History Narrative   2 sons, one adopted   Social Determinants of Health   Financial Resource Strain: Not on file  Food Insecurity: Not on file  Transportation Needs: Not on file  Physical Activity: Not on file  Stress: Not on file  Social Connections: Not on file      Family History: The patient's family history includes Diabetes in her father; Heart disease in her father and mother; Heart failure in her father and mother; Obesity in her brother.  ROS:   Please see the history of present illness.    All other systems reviewed and are negative.  EKGs/Labs/Other Studies Reviewed:    The following studies were reviewed today: EKG reveals atrial fibrillation with well-controlled ventricular rate.   Recent Labs: 09/13/2020: Magnesium 1.9 02/15/2021: Hemoglobin 12.6; Platelet Count 245 06/07/2021: ALT 13; BUN 20; Creatinine, Ser 1.04; Potassium 4.8; Sodium 140; TSH 1.650  Recent Lipid Panel    Component Value Date/Time   CHOL 115 06/14/2021 0821   TRIG 111 06/14/2021 0821   HDL 47 06/14/2021 0821   CHOLHDL  2.4 06/14/2021 0821   LDLCALC 48 06/14/2021 0821    Physical Exam:    VS:  BP 102/68   Pulse 82   Ht '5\' 10"'$  (1.778 m)   Wt 208 lb 1.3 oz (94.4 kg)   LMP  (LMP Unknown)   BMI 29.86 kg/m     Wt Readings from Last 3 Encounters:  07/02/21 208 lb 1.3 oz (94.4 kg)  06/07/21 216 lb 1.9 oz (98 kg)  02/15/21 217 lb (98.4 kg)     GEN: Patient is in no acute distress HEENT: Normal NECK: No JVD; No carotid bruits LYMPHATICS: No lymphadenopathy CARDIAC: Hear sounds regular, 2/6 systolic murmur at the apex. RESPIRATORY:  Clear to auscultation without rales, wheezing or rhonchi  ABDOMEN: Soft, non-tender, non-distended MUSCULOSKELETAL:  No edema; No deformity  SKIN: Warm and dry NEUROLOGIC:  Alert and oriented x 3 PSYCHIATRIC:  Normal affect   Signed, Jenean Lindau, MD  07/02/2021 11:07 AM    [place]

## 2021-07-02 NOTE — Telephone Encounter (Signed)
Patient is requesting a call back to discuss her most recent Xray. She states she forgot to mention it during her visit today.

## 2021-07-06 ENCOUNTER — Other Ambulatory Visit: Payer: Self-pay

## 2021-07-06 ENCOUNTER — Encounter: Payer: Self-pay | Admitting: Obstetrics and Gynecology

## 2021-07-06 ENCOUNTER — Ambulatory Visit (INDEPENDENT_AMBULATORY_CARE_PROVIDER_SITE_OTHER): Payer: Medicare Other | Admitting: Obstetrics and Gynecology

## 2021-07-06 VITALS — BP 112/68 | HR 78 | Ht 70.0 in | Wt 208.8 lb

## 2021-07-06 DIAGNOSIS — Z01419 Encounter for gynecological examination (general) (routine) without abnormal findings: Secondary | ICD-10-CM | POA: Diagnosis not present

## 2021-07-06 NOTE — Patient Instructions (Signed)

## 2021-07-06 NOTE — Progress Notes (Signed)
80 y.o. [id number] Widowed White or Caucasian Not Hispanic or Latino female here for breast and pelvic exam.  No vaginal bleeding.   She has nocturia x 4-5. She is voiding normal amounts. Able to fall back asleep. Rare urge incontinence.    H/O right breast cancer, s/p lumpectomy and radiation. Completed Femara in 2015.   Last HgbA1C was 6.3%.   She is going to have a procedure to treat her AFib.   No LMP recorded (lmp unknown). Patient is postmenopausal.          Sexually active: No.  The current method of family planning is post menopausal status.    Exercising: Yes.     Walking  Smoker:  no  Health Maintenance: Pap:  03/23/13 normal,  History of abnormal Pap:  no MMG:  09/01/20 biopsy of left breast, benign papilloma. Mammogram scheduled next week BMD:   09/21/14 normal  Colonoscopy: 03/01/14 polyps, she is overdue and will call to schedule.  TDaP:  08/2015 Gardasil: n/a   reports that she quit smoking about 29 years ago. Her smoking use included cigarettes. She started smoking about 59 years ago. She has a 7.50 pack-year smoking history. She has never used smokeless tobacco. She reports that she does not drink alcohol and does not use drugs. Retired from [employer], still works there part time. Husband died many years ago at 60 (suicide, he was the love of her life, no partner since then). She has 2 sons (one is adopted), both in [place]. She has 2 grand children. She plans to stay in [place], has lots of friends here, loves her part time job  Past Medical History:  Diagnosis Date   Breast CA [facility]) 04/2009   right - radiation and lumpectomy   Diabetes mellitus due to underlying condition with unspecified complications ([place]) 123456   Diabetes mellitus, type 2 ([place]) 01/27/2012   Essential hypertension 10/07/2016   Family history of breast cancer    aunt   HLD (hyperlipidemia) 08/17/2012   Hypercholesteremia 2000   Hypertension    history   Macular degeneration  2016   dry-eye type   New onset a-fib ([place]) 06/28/2020   Osteoporosis 09/06/2016   Personal history of radiation therapy    Postmenopausal    took HRT 1999 - 2004   Preop cardiovascular exam 07/03/2020   Unspecified atrial fibrillation ([place]) 07/03/2020    Past Surgical History:  Procedure Laterality Date   BREAST BIOPSY     BREAST LUMPECTOMY  04/2009   rt breast  estrogen +, Her 2 Nu negative   BREAST LUMPECTOMY WITH RADIOACTIVE SEED LOCALIZATION Left 09/07/2020   Procedure: RADIOCATIVE SEED GUIDED LEFT BREAST LUMPECTOMY;  Surgeon: Alphonsa Overall, MD;  Location: [facility];  Service: General;  Laterality: Left;   COLONOSCOPY WITH PROPOFOL N/A 04/05/2014   Procedure: COLONOSCOPY WITH PROPOFOL;  Surgeon: Garlan Fair, MD;  [facility];  Service: Endoscopy;  Laterality: N/A;   ORIF FOOT FRACTURE Right 04/20/2014   done in [place] EXTRACTION      Current Outpatient Medications  Medication Sig Dispense Refill   amiodarone (PACERONE) 200 MG tablet Take 1 tablet (200 mg total) by mouth daily. 90 tablet 3   apixaban (ELIQUIS) 5 MG TABS tablet Take 1 tablet (5 mg total) by mouth 2 (two) times daily. 180 tablet 1   Calcium Carbonate-Vitamin D3 600-400 MG-UNIT TABS Take 1 tablet by mouth 2 (two) times daily.  cholecalciferol (VITAMIN D3) 25 MCG (1000 UNIT) tablet Take 1,000 Units by mouth daily.     ezetimibe (ZETIA) 10 MG tablet Take 10 mg by mouth every evening.     gabapentin (NEURONTIN) 600 MG tablet Take 600 mg by mouth 3 (three) times daily.     Insulin Lispro Prot & Lispro (HUMALOG MIX 50/50 [place]) Inject 16-20 Units into the skin 3 (three) times daily.     JARDIANCE 25 MG TABS tablet Take 25 mg by mouth daily.     metFORMIN (GLUCOPHAGE) 500 MG tablet Take 1,000 mg by mouth 2 (two) times daily.     rosuvastatin (CRESTOR) 40 MG tablet Take 40 mg by mouth every evening.     vitamin B-12 (CYANOCOBALAMIN) 1000 MCG tablet Take 1,000 mcg by mouth  daily.     No current facility-administered medications for this visit.    Family History  Problem Relation Age of Onset   Heart disease Mother        Confestive Heart Failure   Heart failure Mother    Diabetes Father    Heart disease Father        Congestive Heart Failure   Heart failure Father    Obesity Brother     Review of Systems  All other systems reviewed and are negative.  Exam:   BP 112/68   Pulse 78   Ht '5\' 10"'$  (1.778 m)   Wt 208 lb 12.8 oz (94.7 kg)   LMP  (LMP Unknown)   SpO2 100%   BMI 29.96 kg/m   Weight change: '@WEIGHTCHANGE'$ @ Height:   Height: '5\' 10"'$  (177.8 cm)  Ht Readings from Last 3 Encounters:  07/06/21 '5\' 10"'$  (1.778 m)  07/02/21 '5\' 10"'$  (1.778 m)  06/07/21 '5\' 10"'$  (1.778 m)    General appearance: alert, cooperative and appears stated age Head: Normocephalic, without obvious abnormality, atraumatic Neck: no adenopathy, supple, symmetrical, trachea midline and thyroid normal to inspection and palpation Breasts: normal appearance, no masses or tenderness, evidence of right lumpectomy Abdomen: soft, non-tender; non distended,  no masses,  no organomegaly Extremities: extremities normal, atraumatic, no cyanosis or edema Skin: Skin color, texture, turgor normal. No rashes or lesions Lymph nodes: Cervical, supraclavicular, and axillary nodes normal. No abnormal inguinal nodes palpated Neurologic: Grossly normal   Pelvic: External genitalia:  no lesions              Urethra:  normal appearing urethra with no masses, tenderness or lesions              Bartholins and Skenes: normal                 Vagina: normal appearing vagina with normal color and discharge, no lesions              Cervix: no lesions               Bimanual Exam:  Uterus:   no masses or tendernes              Adnexa: no mass, fullness, tenderness               Rectovaginal: Confirms               Anus:  normal sphincter tone, no lesions  Vickie Burns, CMA chaperoned for the  exam.  1. Encounter for gynecological examination without abnormal finding No pap needed Mammogram scheduled She will set up her colonoscopy Labs with primary Discussed breast self exam Discussed calcium  and vit D intake

## 2021-07-09 ENCOUNTER — Ambulatory Visit
Admission: RE | Admit: 2021-07-09 | Discharge: 2021-07-09 | Disposition: A | Discharge status: Home / Self Care | Payer: Medicare Other | Source: Ambulatory Visit | Attending: Geriatric Medicine | Admitting: Geriatric Medicine

## 2021-07-09 ENCOUNTER — Other Ambulatory Visit: Payer: Self-pay

## 2021-07-09 DIAGNOSIS — Z1231 Encounter for screening mammogram for malignant neoplasm of breast: Secondary | ICD-10-CM

## 2021-07-13 ENCOUNTER — Ambulatory Visit (HOSPITAL_BASED_OUTPATIENT_CLINIC_OR_DEPARTMENT_OTHER)
Admission: RE | Admit: 2021-07-13 | Discharge: 2021-07-13 | Disposition: A | Discharge status: Home / Self Care | Payer: Medicare Other | Source: Ambulatory Visit | Attending: Cardiology | Admitting: Cardiology

## 2021-07-13 ENCOUNTER — Other Ambulatory Visit: Payer: Self-pay

## 2021-07-13 DIAGNOSIS — I4891 Unspecified atrial fibrillation: Secondary | ICD-10-CM | POA: Diagnosis not present

## 2021-07-13 LAB — ECHOCARDIOGRAM COMPLETE
Area-P 1/2: 4.12 cm2
Calc EF: 55.6 %
S' Lateral: 3.71 cm
Single Plane A2C EF: 56.7 %
Single Plane A4C EF: 52.5 %

## 2021-08-12 ENCOUNTER — Other Ambulatory Visit: Payer: Self-pay | Admitting: Cardiology

## 2021-08-13 NOTE — Telephone Encounter (Signed)
Prescription refill request for Eliquis received. Indication:atrial fib Last office visit:8/22 Scr:1.0 Age: 80 Weight:94.7 kg  Prescription refilled

## 2021-08-17 IMAGING — DX DG CHEST 2V
2 series · 2 of 2 positions shown · non-contrast
Comparison: Prior CT chest 10/09/2016

CLINICAL DATA: 80-year-old female with atrial fibrillation on
amiodarone therapy.

EXAM:
CHEST - 2 VIEW

[chest pa]
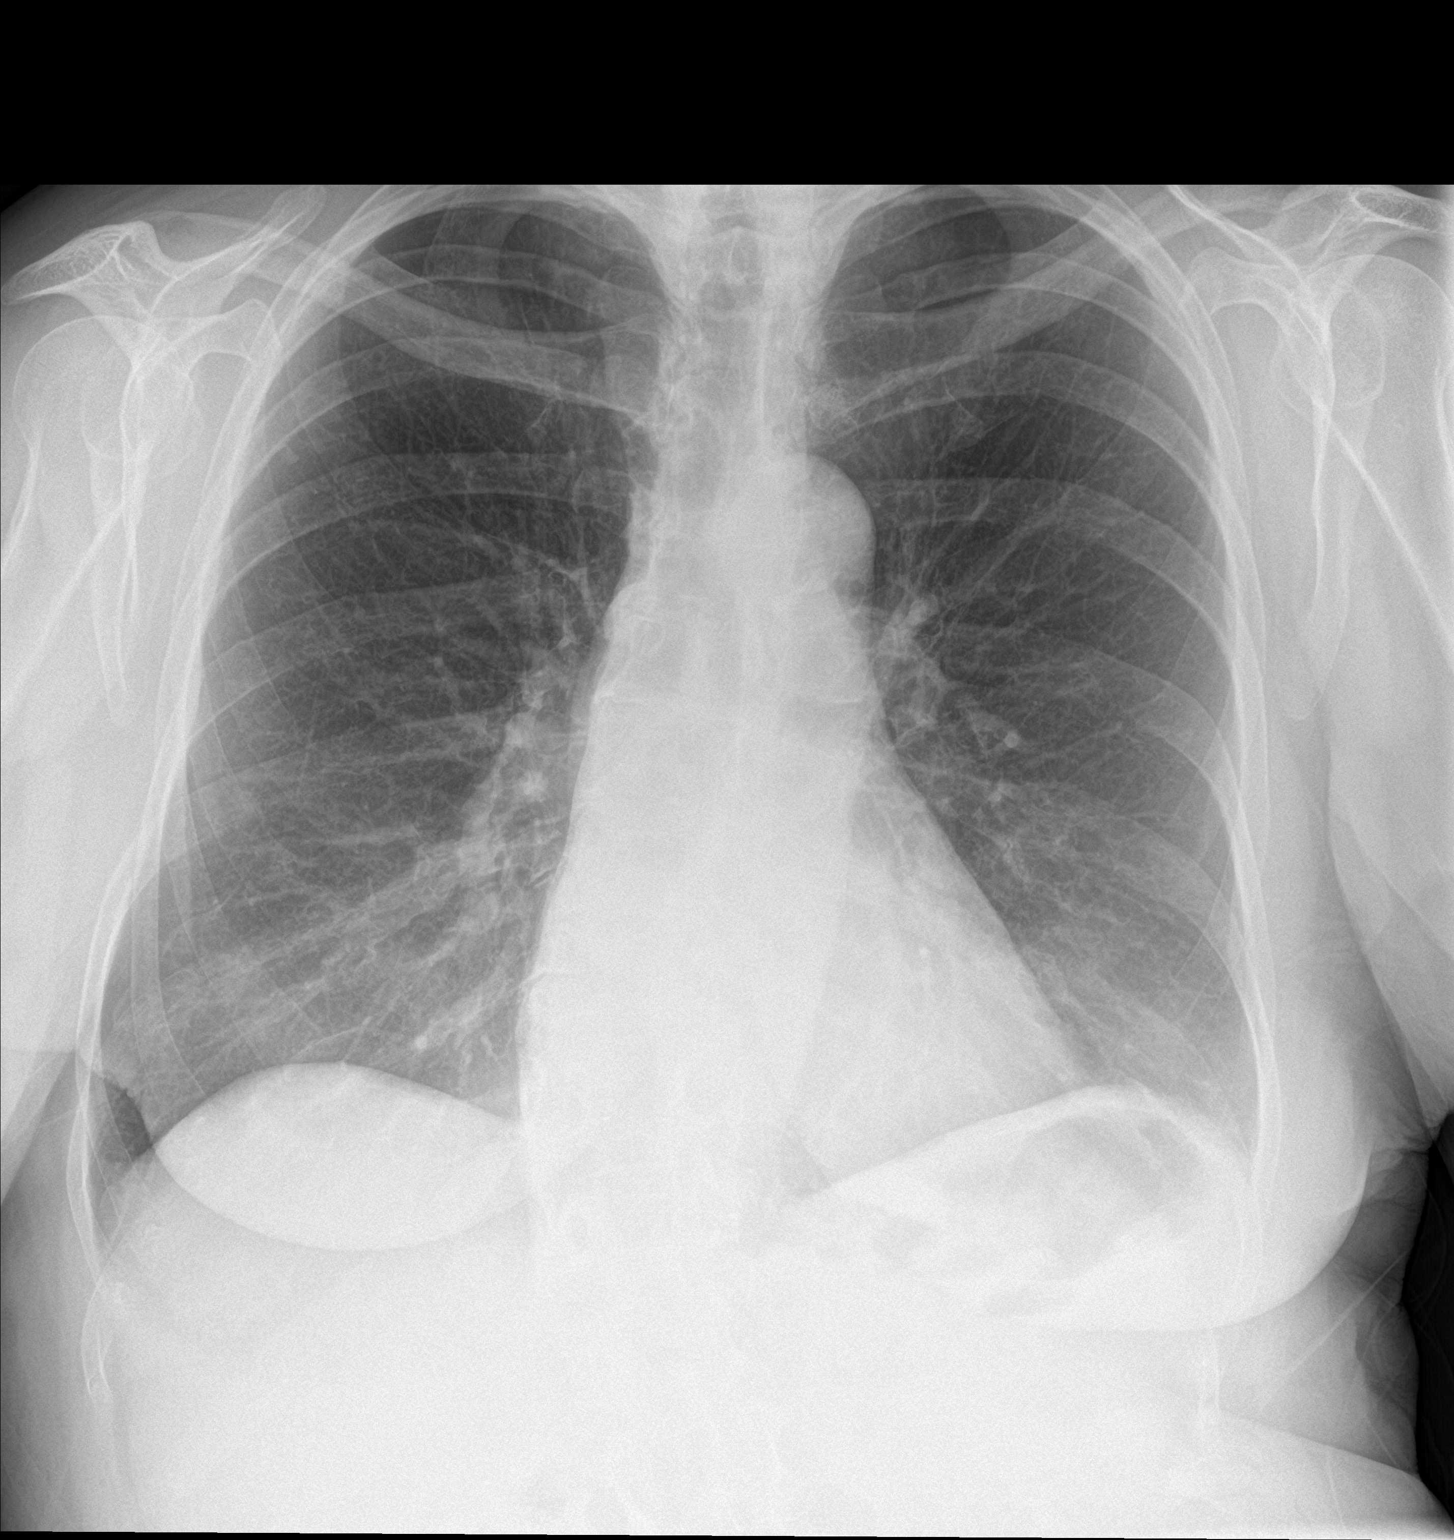

[chest lat]
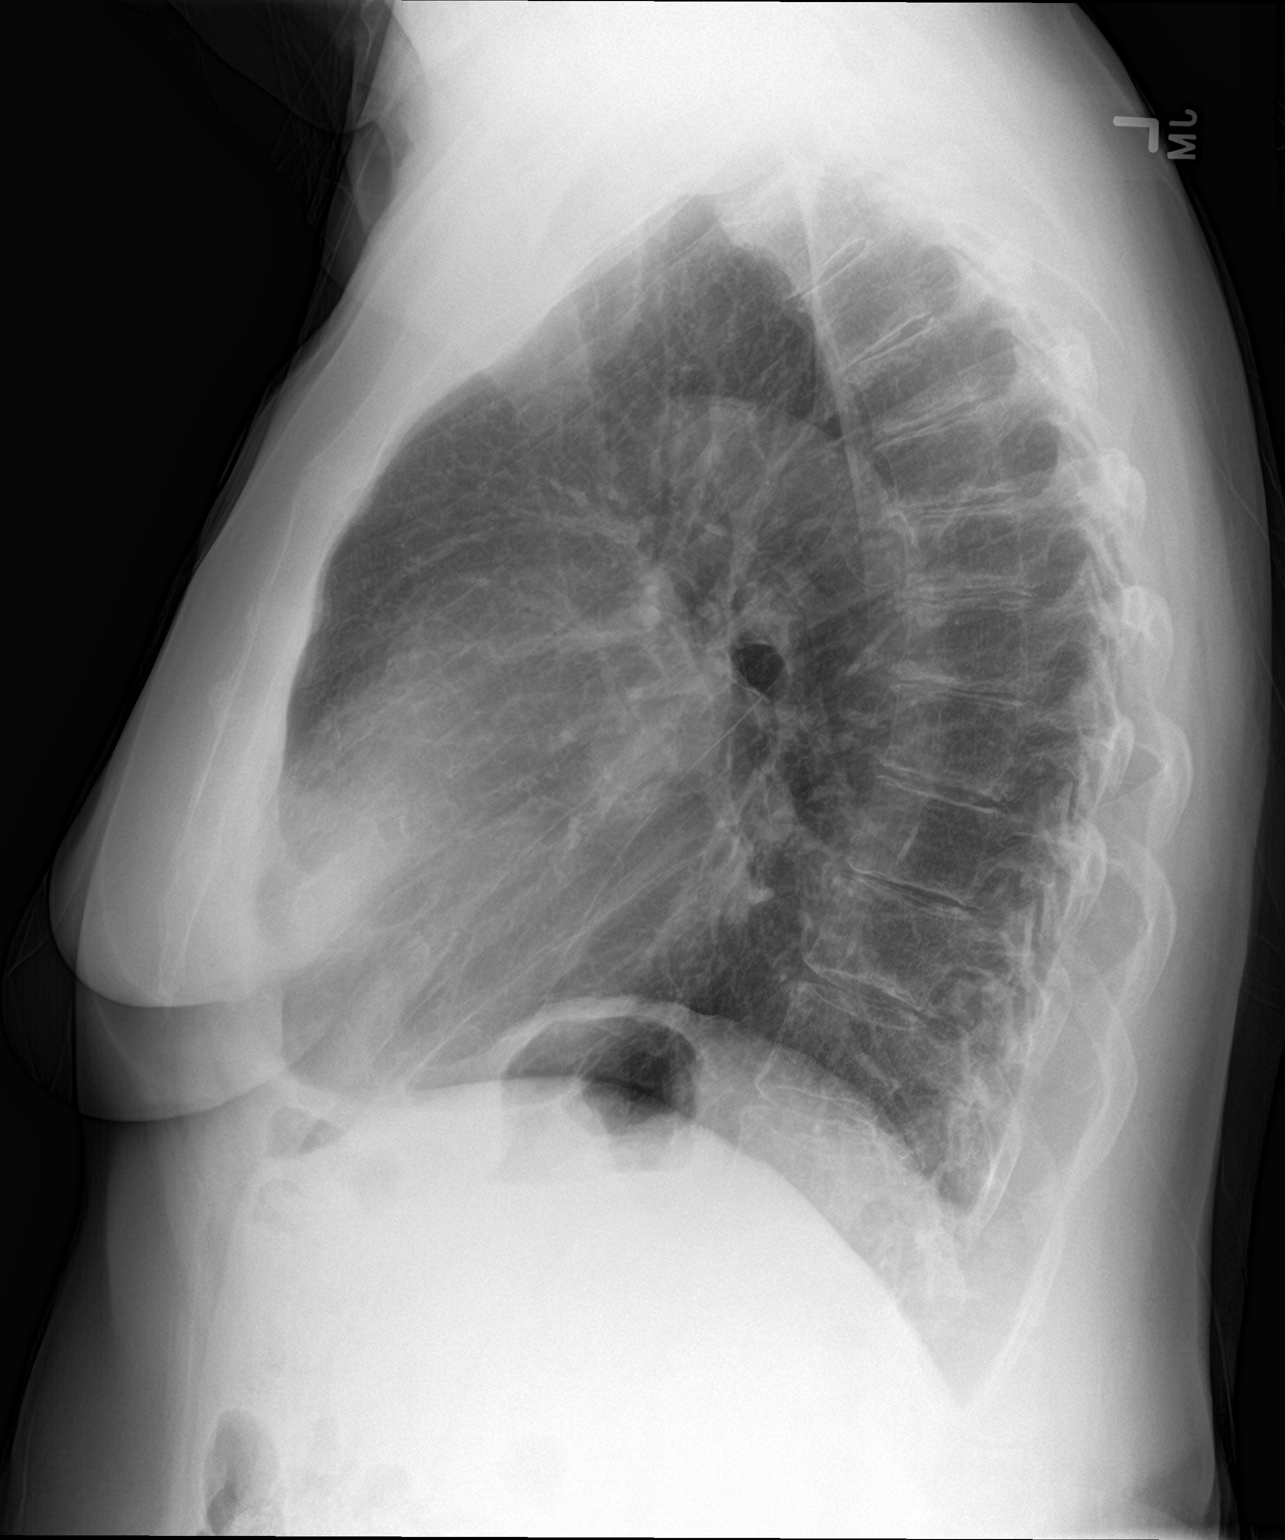

[2 of 2 positions shown; findings below may reference images not displayed]

FINDINGS: The lungs are clear and negative for focal airspace consolidation,
pulmonary edema or suspicious pulmonary nodule. Indeterminate small
4 mm nodule in the periphery of the right upper lobe in between the
fifth and sixth ribs. Stable mild chronic bronchitic changes. No
pleural effusion or pneumothorax. Cardiac and mediastinal contours
are within normal limits. No acute fracture or lytic or blastic
osseous lesions. The visualized upper abdominal bowel gas pattern is
unremarkable.
IMPRESSION: 1. Nonspecific 4 mm nodular opacity in the periphery of the right
upper lobe between the fifth and sixth ribs. The nodule is
relatively dense for size and may be calcified. Given small size, no
further follow-up is required if patient is at low risk (nonsmoker
with no history of prior malignancy). If patient is at higher risk
for malignancy, then further evaluation with chest CT can be
considered.
2. No acute cardiopulmonary process.

## 2021-09-25 ENCOUNTER — Other Ambulatory Visit: Payer: Self-pay

## 2021-09-25 ENCOUNTER — Encounter: Payer: Self-pay | Admitting: Cardiology

## 2021-09-25 ENCOUNTER — Ambulatory Visit (INDEPENDENT_AMBULATORY_CARE_PROVIDER_SITE_OTHER): Payer: Medicare Other | Admitting: Cardiology

## 2021-09-25 VITALS — BP 120/76 | HR 79 | Ht 70.0 in | Wt 203.1 lb

## 2021-09-25 DIAGNOSIS — I10 Essential (primary) hypertension: Secondary | ICD-10-CM | POA: Diagnosis not present

## 2021-09-25 DIAGNOSIS — E7800 Pure hypercholesterolemia, unspecified: Secondary | ICD-10-CM

## 2021-09-25 DIAGNOSIS — E088 Diabetes mellitus due to underlying condition with unspecified complications: Secondary | ICD-10-CM | POA: Diagnosis not present

## 2021-09-25 DIAGNOSIS — I2510 Atherosclerotic heart disease of native coronary artery without angina pectoris: Secondary | ICD-10-CM

## 2021-09-25 DIAGNOSIS — I480 Paroxysmal atrial fibrillation: Secondary | ICD-10-CM | POA: Diagnosis not present

## 2021-09-25 HISTORY — DX: Atherosclerotic heart disease of native coronary artery without angina pectoris: I25.10

## 2021-09-25 NOTE — Progress Notes (Signed)
Cardiology Office Note:    Date:  09/25/2021   ID:  Vickie Burns, Vickie Burns November 11, 1941, MRN [id number]  PCP:  Lajean Manes, MD  Cardiologist:  Jenean Lindau, MD   Referring MD: Lajean Manes, MD    ASSESSMENT:    1. Essential hypertension   2. Hypercholesteremia   3. Diabetes mellitus due to underlying condition with unspecified complications ([place])   4. Paroxysmal atrial fibrillation ([place])   5. Coronary arteriosclerosis    PLAN:    In order of problems listed above:  Primary prevention stressed with the patient.  Importance of compliance with diet medication stressed and she vocalized understanding. Paroxysmal atrial fibrillation:I discussed with the patient atrial fibrillation, disease process. Management and therapy including rate and rhythm control, anticoagulation benefits and potential risks were discussed extensively with the patient. Patient had multiple questions which were answered to patient's satisfaction.  She has not missed her anticoagulation since the past 1 month.  She takes this very meticulously.  I discussed cardioversion benefits and potential risks and she is agreeable.  Amiodarone benefits and potential risks were also discussed and she understands.  Further recommendations will be made based on the findings of the cardioversion.  Echocardiogram report was also reviewed with her.  Her left atrium was significantly elevated and that might pose a challenge to success of cardioversion and she understands. Essential hypertension: Blood pressure stable and diet was emphasized. Mixed dyslipidemia: Lipids reviewed and discussed with her. She will be seen in follow-up appointment after cardioversion.   Medication Adjustments/Labs and Tests Ordered: Current medicines are reviewed at length with the patient today.  Concerns regarding medicines are outlined above.  No orders of the defined types were placed in this encounter.  No orders of the defined types were  placed in this encounter.    No chief complaint on file.    History of Present Illness:    Vickie Burns is a 80 y.o. female.  Patient has past medical history of essential hypertension, dyslipidemia, diabetes mellitus, and paroxysmal atrial fibrillation/flutter.  She is on anticoagulation and tells me that since the past several weeks she has not missed a single dose.  No chest pain orthopnea or PND.  At the time of my evaluation, the patient is alert awake oriented and in no distress.  Past Medical History:  Diagnosis Date   Atrial fibrillation ([place]) 02/12/2021   Breast CA ([place]) 04/2009   right - radiation and lumpectomy   Diabetes mellitus due to underlying condition with unspecified complications ([place]) 3/33/5456   Diabetes mellitus, type 2 ([place]) 01/27/2012   Diabetic peripheral neuropathy associated with type 2 diabetes mellitus ([place]) 02/12/2021   Diabetic retinopathy associated with type 2 diabetes mellitus ([place]) 02/12/2021   Essential hypertension 10/07/2016   Family history of breast cancer    aunt   History of adenomatous polyp of colon 02/12/2021   HLD (hyperlipidemia) 08/17/2012   Hypercholesteremia 2000   Hypercholesterolemia 2000   Hypertension    history   Long term (current) use of insulin ([place]) 02/12/2021   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   New onset a-fib ([place]) 06/28/2020   Osteoporosis 09/06/2016   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   Persistent atrial fibrillation ([place]) 06/07/2021   Personal history of malignant neoplasm of breast 02/12/2021   Personal history of radiation therapy    Postmenopausal    took HRT 1999 - 2004   Preop cardiovascular exam 07/03/2020   Unspecified atrial fibrillation ([place]) 07/03/2020  Past Surgical History:  Procedure Laterality Date   BREAST BIOPSY     BREAST LUMPECTOMY  04/2009   rt breast  estrogen +, Her 2 Nu negative   BREAST LUMPECTOMY WITH RADIOACTIVE SEED LOCALIZATION Left 09/07/2020   Procedure: RADIOCATIVE  SEED GUIDED LEFT BREAST LUMPECTOMY;  Surgeon: Alphonsa Overall, MD;  Location: [facility];  Service: General;  Laterality: Left;   COLONOSCOPY WITH PROPOFOL N/A 04/05/2014   Procedure: COLONOSCOPY WITH PROPOFOL;  Surgeon: Garlan Fair, MD;  Location: [facility];  Service: Endoscopy;  Laterality: N/A;   ORIF FOOT FRACTURE Right 04/20/2014   done in [place] EXTRACTION      Current Medications: Current Meds  Medication Sig   amiodarone (PACERONE) 200 MG tablet Take 1 tablet (200 mg total) by mouth daily.   Calcium Carbonate-Vitamin D3 600-400 MG-UNIT TABS Take 1 tablet by mouth 2 (two) times daily.   cholecalciferol (VITAMIN D3) 25 MCG (1000 UNIT) tablet Take 1,000 Units by mouth daily.   ELIQUIS 5 MG TABS tablet TAKE 1 TABLET BY MOUTH  TWICE DAILY   ezetimibe (ZETIA) 10 MG tablet Take 10 mg by mouth every evening.   gabapentin (NEURONTIN) 600 MG tablet Take 600 mg by mouth 3 (three) times daily.   HUMALOG MIX 50/50 KWIKPEN (50-50) 100 UNIT/ML KwikPen Inject 14 Units into the skin in the morning. Then takes 10 units in at lunch and takes 14 in the evening   JARDIANCE 25 MG TABS tablet Take 25 mg by mouth daily.   metFORMIN (GLUCOPHAGE) 500 MG tablet Take 1,000 mg by mouth 2 (two) times daily.   rosuvastatin (CRESTOR) 40 MG tablet Take 40 mg by mouth every evening.   vitamin B-12 (CYANOCOBALAMIN) 1000 MCG tablet Take 1,000 mcg by mouth daily.     Allergies:   Patient has no known allergies.   Social History   Socioeconomic History   Marital status: Widowed    Spouse name: Not on file   Number of children: Not on file   Years of education: Not on file   Highest education level: Not on file  Occupational History   Not on file  Tobacco Use   Smoking status: Former    Packs/day: 0.25    Years: 30.00    Pack years: 7.50    Types: Cigarettes    Start date: 03/30/1962    Quit date: 11/12/1991    Years since quitting: 29.8   Smokeless tobacco: Never   Vaping Use   Vaping Use: Never used  Substance and Sexual Activity   Alcohol use: No    Alcohol/week: 0.0 standard drinks   Drug use: No   Sexual activity: Not Currently    Birth control/protection: Post-menopausal  Other Topics Concern   Not on file  Social History Narrative   2 sons, one adopted   Social Determinants of Health   Financial Resource Strain: Not on file  Food Insecurity: Not on file  Transportation Needs: Not on file  Physical Activity: Not on file  Stress: Not on file  Social Connections: Not on file     Family History: The patient's family history includes Diabetes in her father; Heart disease in her father and mother; Heart failure in her father and mother; Obesity in her brother.  ROS:   Please see the history of present illness.    All other systems reviewed and are negative.  EKGs/Labs/Other Studies Reviewed:    The following studies were reviewed today: EKG  reveals atrial flutter/fibrillation and nonspecific ST-T changes   Recent Labs: 02/15/2021: Hemoglobin 12.6; Platelet Count 245 06/07/2021: ALT 13; BUN 20; Creatinine, Ser 1.04; Potassium 4.8; Sodium 140; TSH 1.650  Recent Lipid Panel    Component Value Date/Time   CHOL 115 06/14/2021 0821   TRIG 111 06/14/2021 0821   HDL 47 06/14/2021 0821   CHOLHDL 2.4 06/14/2021 0821   LDLCALC 48 06/14/2021 0821    Physical Exam:    VS:  BP 120/76   Pulse 79   Ht 5\' 10"  (1.778 m)   Wt 203 lb 1.3 oz (92.1 kg)   LMP  (LMP Unknown)   SpO2 98%   BMI 29.14 kg/m     Wt Readings from Last 3 Encounters:  09/25/21 203 lb 1.3 oz (92.1 kg)  07/06/21 208 lb 12.8 oz (94.7 kg)  07/02/21 208 lb 1.3 oz (94.4 kg)     GEN: Patient is in no acute distress HEENT: Normal NECK: No JVD; No carotid bruits LYMPHATICS: No lymphadenopathy CARDIAC: Hear sounds regular, 2/6 systolic murmur at the apex. RESPIRATORY:  Clear to auscultation without rales, wheezing or rhonchi  ABDOMEN: Soft, non-tender,  non-distended MUSCULOSKELETAL:  No edema; No deformity  SKIN: Warm and dry NEUROLOGIC:  Alert and oriented x 3 PSYCHIATRIC:  Normal affect   Signed, Jenean Lindau, MD  09/25/2021 11:44 AM    [place]

## 2021-09-25 NOTE — Patient Instructions (Signed)
Medication Instructions:  Your physician recommends that you continue on your current medications as directed. Please refer to the Current Medication list given to you today.  *If you need a refill on your cardiac medications before your next appointment, please call your pharmacy*   Lab Work: Your physician recommends that you have a BMET and CBC today in the office.  If you have labs (blood work) drawn today and your tests are completely normal, you will receive your results only by: [place] (if you have MyChart) OR A paper copy in the mail If you have any lab test that is abnormal or we need to change your treatment, we will call you to review the results.   Testing/Procedures: Your physician has recommended that you have a Cardioversion (DCCV). Electrical Cardioversion uses a jolt of electricity to your heart either through paddles or wired patches attached to your chest. This is a controlled, usually prescheduled, procedure. Defibrillation is done under light anesthesia in the hospital, and you usually go home the day of the procedure. This is done to get your heart back into a normal rhythm. You are not awake for the procedure.   Diagnosis: atrial fib  DATE AND TIME OF PROCEDURE: after Thanksgiving  Please register at the Admitting Department at: to be determined prior to the cardioversion  DO NOT EAT OR DRINK ANYTHING after midnight prior to your procedure.  You should take your medications as usual with a sip of water.  If you are diabetic, DO NOT TAKE YOUR DIABETIC MEDICATIONS the morning OF THE PROCEDURE.  DO NOT STOP or miss any Eliquis doses. If you miss a dose, please let us know as soon as possible.  Have your blood drawn 1 week prior to your cardioversion.  You will need someone with you to drive you home after the procedure.    Follow-Up: At [facility], you and your health needs are our priority.  As part of our continuing mission to provide you with  exceptional heart care, we have created designated Provider Care Teams.  These Care Teams include your primary Cardiologist (physician) and Advanced Practice Providers (APPs -  Physician Assistants and Nurse Practitioners) who all work together to provide you with the care you need, when you need it.  We recommend signing up for the patient portal called "MyChart".  Sign up information is provided on this After Visit Summary.  MyChart is used to connect with patients for Virtual Visits (Telemedicine).  Patients are able to view lab/test results, encounter notes, upcoming appointments, etc.  Non-urgent messages can be sent to your provider as well.   To learn more about what you can do with MyChart, go to [email or web address].    Your next appointment:   2 month(s)  The format for your next appointment:   In Person  Provider:   Jyl Heinz, MD   Other Instructions  Electrical Cardioversion Electrical cardioversion is the delivery of a jolt of electricity to restore a normal rhythm to the heart. A rhythm that is too fast or is not regular keeps the heart from pumping well. In this procedure, sticky patches or metal paddles are placed on the chest to deliver electricity to the heart from a device. This procedure may be done in an emergency if: There is low or no blood pressure as a result of the heart rhythm. Normal rhythm must be restored as fast as possible to protect the brain and heart from further damage. It may save a life.  This may also be a scheduled procedure for irregular or fast heart rhythms that are not immediately life-threatening. Tell a health care provider about: Any allergies you have. All medicines you are taking, including vitamins, herbs, eye drops, creams, and over-the-counter medicines. Any problems you or family members have had with anesthetic medicines. Any blood disorders you have. Any surgeries you have had. Any medical conditions you have. Whether you  are pregnant or may be pregnant. What are the risks? Generally, this is a safe procedure. However, problems may occur, including: Allergic reactions to medicines. A blood clot that breaks free and travels to other parts of your body. The possible return of an abnormal heart rhythm within hours or days after the procedure. Your heart stopping (cardiac arrest). This is rare. What happens before the procedure? Medicines Your health care provider may have you start taking: Blood-thinning medicines (anticoagulants) so your blood does not clot as easily. Medicines to help stabilize your heart rate and rhythm. Ask your health care provider about: Changing or stopping your regular medicines. This is especially important if you are taking diabetes medicines or blood thinners. Taking medicines such as aspirin and ibuprofen. These medicines can thin your blood. Do not take these medicines unless your health care provider tells you to take them. Taking over-the-counter medicines, vitamins, herbs, and supplements. General instructions Follow instructions from your health care provider about eating or drinking restrictions. Plan to have someone take you home from the hospital or clinic. If you will be going home right after the procedure, plan to have someone with you for 24 hours. Ask your health care provider what steps will be taken to help prevent infection. These may include washing your skin with a germ-killing soap. What happens during the procedure? An IV will be inserted into one of your veins. Sticky patches (electrodes) or metal paddles may be placed on your chest. You will be given a medicine to help you relax (sedative). An electrical shock will be delivered. The procedure may vary among health care providers and hospitals.    What can I expect after the procedure? Your blood pressure, heart rate, breathing rate, and blood oxygen level will be monitored until you leave the hospital or  clinic. Your heart rhythm will be watched to make sure it does not change. You may have some redness on the skin where the shocks were given. Follow these instructions at home: Do not drive for 24 hours if you were given a sedative during your procedure. Take over-the-counter and prescription medicines only as told by your health care provider. Ask your health care provider how to check your pulse. Check it often. Rest for 48 hours after the procedure or as told by your health care provider. Avoid or limit your caffeine use as told by your health care provider. Keep all follow-up visits as told by your health care provider. This is important. Contact a health care provider if: You feel like your heart is beating too quickly or your pulse is not regular. You have a serious muscle cramp that does not go away. Get help right away if: You have discomfort in your chest. You are dizzy or you feel faint. You have trouble breathing or you are short of breath. Your speech is slurred. You have trouble moving an arm or leg on one side of your body. Your fingers or toes turn cold or blue. Summary Electrical cardioversion is the delivery of a jolt of electricity to restore a normal rhythm to  the heart. This procedure may be done right away in an emergency or may be a scheduled procedure if the condition is not an emergency. Generally, this is a safe procedure. After the procedure, check your pulse often as told by your health care provider. This information is not intended to replace advice given to you by your health care provider. Make sure you discuss any questions you have with your health care provider. Document Revised: 05/31/2019 Document Reviewed: 05/31/2019 Elsevier Patient Education  [place].

## 2021-10-03 DIAGNOSIS — I4891 Unspecified atrial fibrillation: Secondary | ICD-10-CM

## 2021-10-12 ENCOUNTER — Telehealth: Payer: Self-pay

## 2021-10-12 DIAGNOSIS — I4891 Unspecified atrial fibrillation: Secondary | ICD-10-CM

## 2021-10-12 NOTE — Telephone Encounter (Signed)
Spoke with Lattie Haw this morning in regards to this patient. She needed the member ID number from the patients insurance card. She also was wanting to know if the patient was aware that this is scheduled for Monday 12/5.   I called the patient just now and she said she had no idea that this was scheduled for Monday. She is very unhappy about this. She is going to call her daughter in-law to see if she is able to accommodate for this and if not she is going to need to reschedule.

## 2021-10-12 NOTE — Telephone Encounter (Signed)
Spoke to the patient just now again. She said that her daughter in-law is going to make arrangement's to come in and stay with her so that she can do it on Monday. She is coming to the office now to get her labs done and I will run them as STAT so that we get them back in time to send them to [facility].

## 2021-10-12 NOTE — Addendum Note (Signed)
Addended by: Resa Miner I on: 10/12/2021 11:19 AM   Modules accepted: Orders

## 2021-10-13 LAB — BASIC METABOLIC PANEL
BUN/Creatinine Ratio: 18 (ref 12–28)
BUN: 21 mg/dL (ref 8–27)
CO2: 21 mmol/L (ref 20–29)
Calcium: 9.6 mg/dL (ref 8.7–10.3)
Chloride: 101 mmol/L (ref 96–106)
Creatinine, Ser: 1.15 mg/dL — ABNORMAL HIGH (ref 0.57–1.00)
Glucose: 203 mg/dL — ABNORMAL HIGH (ref 70–99)
Potassium: 4.8 mmol/L (ref 3.5–5.2)
Sodium: 139 mmol/L (ref 134–144)
eGFR: 49 mL/min/{1.73_m2} — ABNORMAL LOW (ref >59)

## 2021-10-13 LAB — CBC WITH DIFFERENTIAL/PLATELET
Basophils Absolute: 0.1 10*3/uL (ref 0.0–0.2)
Basos: 1 %
EOS (ABSOLUTE): 0.2 10*3/uL (ref 0.0–0.4)
Eos: 3 %
Hematocrit: 39.8 % (ref 34.0–46.6)
Hemoglobin: 12.7 g/dL (ref 11.1–15.9)
Immature Grans (Abs): 0 10*3/uL (ref 0.0–0.1)
Immature Granulocytes: 0 %
Lymphocytes Absolute: 1 10*3/uL (ref 0.7–3.1)
Lymphs: 14 %
MCH: 28.3 pg (ref 26.6–33.0)
MCHC: 31.9 g/dL (ref 31.5–35.7)
MCV: 89 fL (ref 79–97)
Monocytes Absolute: 0.7 10*3/uL (ref 0.1–0.9)
Monocytes: 9 %
Neutrophils Absolute: 5.7 10*3/uL (ref 1.4–7.0)
Neutrophils: 73 %
Platelets: 252 10*3/uL (ref 150–450)
RBC: 4.48 x10E6/uL (ref 3.77–5.28)
RDW: 15 % (ref 11.7–15.4)
WBC: 7.7 10*3/uL (ref 3.4–10.8)

## 2021-10-17 DIAGNOSIS — I4891 Unspecified atrial fibrillation: Secondary | ICD-10-CM | POA: Diagnosis not present

## 2021-10-22 ENCOUNTER — Telehealth: Payer: Self-pay | Admitting: Cardiology

## 2021-10-22 NOTE — Telephone Encounter (Signed)
Sx resolved. Advised to go to the ED if recurrent. Pt states sx are same as before cardioversion. Post cardioversion appt changed to 11/2021. Pt verbalized understanding and had no additional questions.

## 2021-10-22 NOTE — Telephone Encounter (Signed)
Episode of SHOB and Back of shoulders ached this morning, has started back "tiny breaths" as well (about 14- she counted - since Saturday night)  Best contact [phone]  Thank you!

## 2021-11-14 DIAGNOSIS — D6859 Other primary thrombophilia: Secondary | ICD-10-CM

## 2021-11-14 HISTORY — DX: Other primary thrombophilia: D68.59

## 2021-11-21 ENCOUNTER — Other Ambulatory Visit: Payer: Self-pay

## 2021-11-21 ENCOUNTER — Encounter: Payer: Self-pay | Admitting: Cardiology

## 2021-11-21 ENCOUNTER — Other Ambulatory Visit: Payer: Self-pay | Admitting: Cardiology

## 2021-11-21 ENCOUNTER — Ambulatory Visit (INDEPENDENT_AMBULATORY_CARE_PROVIDER_SITE_OTHER): Payer: Medicare Other | Admitting: Cardiology

## 2021-11-21 VITALS — BP 100/62 | HR 70 | Ht 70.0 in | Wt 202.0 lb

## 2021-11-21 DIAGNOSIS — I10 Essential (primary) hypertension: Secondary | ICD-10-CM

## 2021-11-21 DIAGNOSIS — E088 Diabetes mellitus due to underlying condition with unspecified complications: Secondary | ICD-10-CM

## 2021-11-21 DIAGNOSIS — I2510 Atherosclerotic heart disease of native coronary artery without angina pectoris: Secondary | ICD-10-CM

## 2021-11-21 DIAGNOSIS — E782 Mixed hyperlipidemia: Secondary | ICD-10-CM | POA: Diagnosis not present

## 2021-11-21 DIAGNOSIS — Z5181 Encounter for therapeutic drug level monitoring: Secondary | ICD-10-CM

## 2021-11-21 DIAGNOSIS — Z79899 Other long term (current) drug therapy: Secondary | ICD-10-CM

## 2021-11-21 DIAGNOSIS — I4891 Unspecified atrial fibrillation: Secondary | ICD-10-CM

## 2021-11-21 MED ORDER — DIGOXIN 125 MCG PO TABS: 0.1250 mg | ORAL_TABLET | Freq: Every day | ORAL | 0 refills | Status: DC

## 2021-11-21 NOTE — Patient Instructions (Addendum)
Medication Instructions:  Your physician has recommended you make the following change in your medication:   Stop Amiodarone Start digoxin 0.125 mg daily. Do not take the medication before your next appointment as we will do labs.  *If you need a refill on your cardiac medications before your next appointment, please call your pharmacy*   Lab Work: Your physician recommends that you return for lab work in: 2 weeks You do not need to fast but do not take your digoxin.  You can come Monday through Friday 8:30 am to 12:00 pm and 1:15 to 4:30. You do not need to make an appointment as the order has already been placed. This will be to check your digoxin level. If you have labs (blood work) drawn today and your tests are completely normal, you will receive your results only by: [place] (if you have MyChart) OR A paper copy in the mail If you have any lab test that is abnormal or we need to change your treatment, we will call you to review the results.   Testing/Procedures: None ordered   Follow-Up: At [facility], you and your health needs are our priority.  As part of our continuing mission to provide you with exceptional heart care, we have created designated Provider Care Teams.  These Care Teams include your primary Cardiologist (physician) and Advanced Practice Providers (APPs -  Physician Assistants and Nurse Practitioners) who all work together to provide you with the care you need, when you need it.  We recommend signing up for the patient portal called "MyChart".  Sign up information is provided on this After Visit Summary.  MyChart is used to connect with patients for Virtual Visits (Telemedicine).  Patients are able to view lab/test results, encounter notes, upcoming appointments, etc.  Non-urgent messages can be sent to your provider as well.   To learn more about what you can do with MyChart, go to [email or web address].    Your next appointment:   2 week(s)  The  format for your next appointment:   In Person  Provider:   Jyl Heinz, MD   Other Instructions NA

## 2021-11-21 NOTE — Progress Notes (Signed)
Cardiology Office Note:    Date:  11/21/2021   ID:  Vickie Burns, Vickie Burns June 17, 1941, MRN [id number]  PCP:  Lajean Manes, MD  Cardiologist:  Jenean Lindau, MD   Referring MD: Lajean Manes, MD    ASSESSMENT:    1. Coronary arteriosclerosis   2. Essential hypertension   3. Mixed hyperlipidemia   4. Diabetes mellitus due to underlying condition with unspecified complications ([place])    PLAN:    In order of problems listed above:  Persistent atrial fibrillation: Failed cardioversion: Failed amiodarone therapy: Patient had a significant enlarged left atrium which I think contributes to the atrial fibrillation persistence.  At any rate her tolerance to atrial fibrillation is fine so we will need to accept her as to be in permanent atrial fibrillation.  We will discontinue amiodarone and start digoxin 0.125 mg daily she will be back in 2 weeks for follow-up.  At that time we will check at this level.  I do not want to start medication such as beta-blockers because she has borderline blood pressure and occasionally has symptoms of orthostatic hypotension.I discussed with the patient atrial fibrillation, disease process. Management and therapy including rate and rhythm control, anticoagulation benefits and potential risks were discussed extensively with the patient. Patient had multiple questions which were answered to patient's satisfaction. Coronary atherosclerosis: Stable at this time.  Asymptomatic.  She was encouraged to keep active and exercise on a regular basis. Mixed dyslipidemia: On statin therapy.  Statin reviewed and diet was emphasized. Patient will be seen in follow-up appointment in 6 months or earlier if the patient has any concerns    Medication Adjustments/Labs and Tests Ordered: Current medicines are reviewed at length with the patient today.  Concerns regarding medicines are outlined above.  No orders of the defined types were placed in this encounter.  No orders  of the defined types were placed in this encounter.    No chief complaint on file.    History of Present Illness:    Vickie Burns is a 81 y.o. female.  Patient has past medical history of coronary atherosclerosis, essential hypertension, dyslipidemia and diabetes mellitus.  She denies any problems at this time and takes care of activities of daily living.  She has persistent atrial fibrillation.  No chest pain orthopnea or PND.  At the time of my evaluation, the patient is alert awake oriented and in no distress.  Past Medical History:  Diagnosis Date   Atrial fibrillation ([place]) 02/12/2021   Breast CA ([place]) 04/2009   right - radiation and lumpectomy   Coronary arteriosclerosis 09/25/2021   Diabetes mellitus due to underlying condition with unspecified complications ([place]) 05/10/1600   Diabetes mellitus, type 2 ([place]) 01/27/2012   Diabetic peripheral neuropathy associated with type 2 diabetes mellitus ([place]) 02/12/2021   Diabetic retinopathy associated with type 2 diabetes mellitus ([place]) 02/12/2021   Essential hypertension 10/07/2016   Family history of breast cancer    aunt   History of adenomatous polyp of colon 02/12/2021   HLD (hyperlipidemia) 08/17/2012   Hypercholesteremia 2000   Hypercholesterolemia 2000   Hypertension    history   Long term (current) use of insulin ([place]) 02/12/2021   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) 07/03/2020    Past Surgical History:  Procedure Laterality Date   BREAST BIOPSY     BREAST LUMPECTOMY  04/2009   rt breast  estrogen +, Her 2 Nu negative    BREAST LUMPECTOMY WITH RADIOACTIVE SEED LOCALIZATION Left 09/07/2020   Procedure: RADIOCATIVE SEED GUIDED LEFT BREAST LUMPECTOMY;  Surgeon: Alphonsa Overall, MD;  Location: [facility];  Service: General;  Laterality: Left;   COLONOSCOPY WITH PROPOFOL N/A 04/05/2014   Procedure: COLONOSCOPY WITH PROPOFOL;  Surgeon: Garlan Fair, MD;  [facility];  Service: Endoscopy;  Laterality: N/A;   ORIF FOOT FRACTURE Right 04/20/2014   done in [place] EXTRACTION      Current Medications: Current Meds  Medication Sig   amiodarone (PACERONE) 200 MG tablet Take 1 tablet (200 mg total) by mouth daily.   aspirin EC 81 MG tablet Take 81 mg by mouth daily.   Calcium Carbonate-Vitamin D3 600-400 MG-UNIT TABS Take 1 tablet by mouth 2 (two) times daily.   ELIQUIS 5 MG TABS tablet TAKE 1 TABLET BY MOUTH  TWICE DAILY   ezetimibe (ZETIA) 10 MG tablet Take 10 mg by mouth every evening.   gabapentin (NEURONTIN) 600 MG tablet Take 600 mg by mouth 3 (three) times daily.   HUMALOG MIX 50/50 KWIKPEN (50-50) 100 UNIT/ML KwikPen Inject 14 Units into the skin in the morning. Then takes 10 units in at lunch and takes 14 in the evening   JARDIANCE 25 MG TABS tablet Take 25 mg by mouth daily.   metFORMIN (GLUCOPHAGE) 500 MG tablet Take 1,000 mg by mouth 2 (two) times daily.   rosuvastatin (CRESTOR) 40 MG tablet Take 40 mg by mouth every evening.   vitamin B-12 (CYANOCOBALAMIN) 1000 MCG tablet Take 1,000 mcg by mouth daily.     Allergies:   Patient has no known allergies.   Social History   Socioeconomic History   Marital status: Widowed    Spouse name: Not on file   Number of children: Not on file   Years of education: Not on file   Highest education level: Not on file  Occupational History   Not on file  Tobacco Use   Smoking status: Former    Packs/day: 0.25    Years: 30.00    Pack years: 7.50    Types: Cigarettes    Start date: 03/30/1962    Quit date: 11/12/1991     Years since quitting: 30.0   Smokeless tobacco: Never  Vaping Use   Vaping Use: Never used  Substance and Sexual Activity   Alcohol use: No    Alcohol/week: 0.0 standard drinks   Drug use: No   Sexual activity: Not Currently    Birth control/protection: Post-menopausal  Other Topics Concern   Not on file  Social History Narrative   2 sons, one adopted   Social Determinants of Health   Financial Resource Strain: Not on file  Food Insecurity: Not on file  Transportation Needs: Not on file  Physical Activity: Not on file  Stress: Not on file  Social Connections: Not on file     Family History: The patient's family history includes Diabetes in her father; Heart disease in her father and mother; Heart failure in her father and mother; Obesity in her brother.  ROS:   Please see the history  of present illness.    All other systems reviewed and are negative.  EKGs/Labs/Other Studies Reviewed:    The following studies were reviewed today: EKG reveals atrial fibrillation with well-controlled ventricular rate.   Recent Labs: 06/07/2021: ALT 13; TSH 1.650 10/12/2021: BUN 21; Creatinine, Ser 1.15; Hemoglobin 12.7; Platelets 252; Potassium 4.8; Sodium 139  Recent Lipid Panel    Component Value Date/Time   CHOL 115 06/14/2021 0821   TRIG 111 06/14/2021 0821   HDL 47 06/14/2021 0821   CHOLHDL 2.4 06/14/2021 0821   LDLCALC 48 06/14/2021 0821    Physical Exam:    VS:  BP 100/62    Pulse 70    Ht 5\' 10"  (1.778 m)    Wt 202 lb 0.6 oz (91.6 kg)    LMP  (LMP Unknown)    SpO2 97%    BMI 28.99 kg/m     Wt Readings from Last 3 Encounters:  11/21/21 202 lb 0.6 oz (91.6 kg)  09/25/21 203 lb 1.3 oz (92.1 kg)  07/06/21 208 lb 12.8 oz (94.7 kg)     GEN: Patient is in no acute distress HEENT: Normal NECK: No JVD; No carotid bruits LYMPHATICS: No lymphadenopathy CARDIAC: Hear sounds regular, 2/6 systolic murmur at the apex. RESPIRATORY:  Clear to auscultation without rales, wheezing  or rhonchi  ABDOMEN: Soft, non-tender, non-distended MUSCULOSKELETAL:  No edema; No deformity  SKIN: Warm and dry NEUROLOGIC:  Alert and oriented x 3 PSYCHIATRIC:  Normal affect   Signed, Jenean Lindau, MD  11/21/2021 11:03 AM    [place]

## 2021-11-23 ENCOUNTER — Telehealth: Payer: Self-pay | Admitting: *Deleted

## 2021-11-23 NOTE — Telephone Encounter (Signed)
Clinical pharmacist to review Eliquis 

## 2021-11-23 NOTE — Telephone Encounter (Signed)
° °  Pre-operative Risk Assessment    Patient Name: Vickie Burns  DOB: 1941/05/27 MRN: [id number]      Request for Surgical Clearance    Procedure:   COLONOSCOPY   Date of Surgery:  Clearance 02/19/22                                 Surgeon:  DR. [facility] Surgeon's Group or Practice Name:   [facility] Phone number:   [phone] Fax number:   [phone]   Type of Clearance Requested:   - Pharmacy:  Hold Apixaban (Eliquis) NOT INDICATED   Type of Anesthesia:   PROPOFOL   Additional requests/questions:      Astrid Divine   11/23/2021, 12:53 PM

## 2021-11-24 NOTE — Telephone Encounter (Signed)
Patient with diagnosis of atrial fibrillation on Eliquis for anticoagulation.    Procedure: colonoscopy Date of procedure: 02/19/22   CHA2DS2-VASc Score = 6   This indicates a 9.7% annual risk of stroke. The patient's score is based upon: CHF History: 0 HTN History: 1 Diabetes History: 1 Stroke History: 0 Vascular Disease History: 1 Age Score: 2 Gender Score: 1    CrCl 49 Platelet count 252  Per office protocol, patient can hold Eliquis for 2 days prior to procedure.   Patient will not need bridging with Lovenox (enoxaparin) around procedure.

## 2021-11-26 NOTE — Telephone Encounter (Signed)
° °  Primary Cardiologist: None  Chart reviewed as part of pre-operative protocol coverage. Given past medical history and time since last visit, based on ACC/AHA guidelines, Vickie Burns would be at acceptable risk for the planned procedure without further cardiovascular testing.   Patient with diagnosis of atrial fibrillation on Eliquis for anticoagulation.     Procedure: colonoscopy Date of procedure: 02/19/22     CHA2DS2-VASc Score = 6   This indicates a 9.7% annual risk of stroke. The patient's score is based upon: CHF History: 0 HTN History: 1 Diabetes History: 1 Stroke History: 0 Vascular Disease History: 1 Age Score: 2 Gender Score: 1     CrCl 49 Platelet count 252   Per office protocol, patient can hold Eliquis for 2 days prior to procedure.   Patient will not need bridging with Lovenox (enoxaparin) around procedure.  I will route this recommendation to the requesting party via Epic fax function and remove from pre-op pool.  Please call with questions.  Jossie Ng. Molleigh Huot NP-C    11/26/2021, 9:54 AM [address] Office [phone] Fax [phone]

## 2021-12-05 ENCOUNTER — Ambulatory Visit (INDEPENDENT_AMBULATORY_CARE_PROVIDER_SITE_OTHER): Payer: Medicare Other

## 2021-12-05 ENCOUNTER — Other Ambulatory Visit: Payer: Self-pay

## 2021-12-05 VITALS — BP 136/78 | HR 63 | Ht 70.0 in | Wt 200.0 lb

## 2021-12-05 DIAGNOSIS — Z79899 Other long term (current) drug therapy: Secondary | ICD-10-CM

## 2021-12-05 NOTE — Progress Notes (Signed)
Patient came in today to have her vital signs checked and an EKG completed. These were all stable. The EKG was reviewed by Dr. Bettina Gavia.   The patient will come back tomorrow morning for labs as she took her digoxin this morning on accident.

## 2021-12-07 LAB — DIGOXIN LEVEL: Digoxin, Serum: 0.8 ng/mL (ref 0.5–0.9)

## 2021-12-12 ENCOUNTER — Telehealth: Payer: Self-pay | Admitting: Cardiology

## 2021-12-12 MED ORDER — DIGOXIN 125 MCG PO TABS: ORAL_TABLET | ORAL | 3 refills | Status: DC

## 2021-12-12 NOTE — Telephone Encounter (Signed)
A user error has taken place: encounter opened in error, closed for administrative reasons.

## 2021-12-12 NOTE — Telephone Encounter (Signed)
Refill sent in per request.  

## 2021-12-12 NOTE — Telephone Encounter (Signed)
°*  STAT* If patient is at the pharmacy, call can be transferred to refill team.   1. Which medications need to be refilled? (please list name of each medication and dose if known) digoxin (LANOXIN) 0.125 MG tablet   2. Which pharmacy/location (including street and city if local pharmacy) is medication to be sent to?[address]  3. Do they need a 30 day or 90 day supply? [id number] - [place], [place] - [address] AT [place]  [address], [place] [address]  Phone:  [phone]  Fax:  [phone]

## 2021-12-14 DIAGNOSIS — Z7901 Long term (current) use of anticoagulants: Secondary | ICD-10-CM | POA: Insufficient documentation

## 2021-12-14 HISTORY — DX: Long term (current) use of anticoagulants: Z79.01

## 2021-12-20 ENCOUNTER — Other Ambulatory Visit: Payer: Self-pay

## 2021-12-20 ENCOUNTER — Telehealth: Payer: Self-pay | Admitting: Family Medicine

## 2021-12-20 ENCOUNTER — Ambulatory Visit (INDEPENDENT_AMBULATORY_CARE_PROVIDER_SITE_OTHER): Payer: Medicare Other | Admitting: Cardiology

## 2021-12-20 ENCOUNTER — Encounter: Payer: Self-pay | Admitting: Cardiology

## 2021-12-20 VITALS — BP 114/62 | HR 60 | Ht 70.0 in | Wt 203.0 lb

## 2021-12-20 DIAGNOSIS — I4819 Other persistent atrial fibrillation: Secondary | ICD-10-CM | POA: Diagnosis not present

## 2021-12-20 DIAGNOSIS — I4891 Unspecified atrial fibrillation: Secondary | ICD-10-CM | POA: Diagnosis not present

## 2021-12-20 DIAGNOSIS — E7800 Pure hypercholesterolemia, unspecified: Secondary | ICD-10-CM

## 2021-12-20 DIAGNOSIS — E088 Diabetes mellitus due to underlying condition with unspecified complications: Secondary | ICD-10-CM | POA: Diagnosis not present

## 2021-12-20 NOTE — Telephone Encounter (Signed)
Patient sees [name] upstairs. Vickie Burns PCP is retiring. [name] reccommended [facility]  Can we accept Vickie Burns as new patient?

## 2021-12-20 NOTE — Progress Notes (Signed)
Cardiology Office Note:    Date:  12/20/2021   ID:  Stewart, Pimenta 08-29-1941, MRN [id number]  PCP:  Lajean Manes, MD  Cardiologist:  Jenean Lindau, MD   Referring MD: Lajean Manes, MD    ASSESSMENT:    1. Atrial fibrillation, unspecified type ([place])   2. Persistent atrial fibrillation ([place])   3. Hypercholesterolemia   4. Diabetes mellitus due to underlying condition with unspecified complications ([place])    PLAN:    In order of problems listed above:  Coronary arteriosclerosis: Secondary prevention stressed to the patient.  Importance of compliance with diet and medication stressed and she verbalized understanding.  She is doing her best to be active and ambulatory. Essential hypertension: Blood pressure is stable and diet was emphasized.  Lifestyle modification urged. Mixed dyslipidemia: On statin therapy and managed by primary care.  Lipids were reviewed. Diabetes mellitus: Elevated hemoglobin A1c.  Patient is managed by endocrinologist and diet was emphasized. Permanent atrial fibrillation: On digoxin therapy: Heart rate is well controlled.I discussed with the patient atrial fibrillation, disease process. Management and therapy including rate and rhythm control, anticoagulation benefits and potential risks were discussed extensively with the patient. Patient had multiple questions which were answered to patient's satisfaction. Patient will be seen in follow-up appointment in 6 months or earlier if the patient has any concerns    Medication Adjustments/Labs and Tests Ordered: Current medicines are reviewed at length with the patient today.  Concerns regarding medicines are outlined above.  Orders Placed This Encounter  Procedures   EKG 12-Lead   No orders of the defined types were placed in this encounter.    No chief complaint on file.    History of Present Illness:    Shaquella Stamant is a 81 y.o. female.  Patient has past medical history of  persistent atrial fibrillation, essential hypertension, dyslipidemia and diabetes mellitus.  She denies any problems at this time and takes care of her activities of daily living.  No chest pain orthopnea PND or palpitations.  At the time of my evaluation, the patient is alert awake oriented and in no distress.  Past Medical History:  Diagnosis Date   Atrial fibrillation ([place]) 02/12/2021   Breast CA ([place]) 04/2009   right - radiation and lumpectomy   Coronary arteriosclerosis 09/25/2021   Diabetes mellitus due to underlying condition with unspecified complications ([place]) 3/71/0626   Diabetes mellitus, type 2 ([place]) 01/27/2012   Diabetic peripheral neuropathy associated with type 2 diabetes mellitus ([place]) 02/12/2021   Diabetic retinopathy associated with type 2 diabetes mellitus ([place]) 02/12/2021   Essential hypertension 10/07/2016   Family history of breast cancer    aunt   History of adenomatous polyp of colon 02/12/2021   HLD (hyperlipidemia) 08/17/2012   Hypercholesteremia 2000   Hypercholesterolemia 2000   Hypertension    history   Long term (current) use of anticoagulants 12/14/2021   Long term (current) use of insulin ([place]) 02/12/2021   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   Unspecified atrial fibrillation ([place]) 07/03/2020    Past Surgical History:  Procedure Laterality Date   BREAST  BIOPSY     BREAST LUMPECTOMY  04/2009   rt breast  estrogen +, Her 2 Nu negative   BREAST LUMPECTOMY WITH RADIOACTIVE SEED LOCALIZATION Left 09/07/2020   Procedure: RADIOCATIVE SEED GUIDED LEFT BREAST LUMPECTOMY;  Surgeon: Alphonsa Overall, MD;   Location: [facility];  Service: General;  Laterality: Left;   COLONOSCOPY WITH PROPOFOL N/A 04/05/2014   Procedure: COLONOSCOPY WITH PROPOFOL;  Surgeon: Garlan Fair, MD;  [facility];  Service: Endoscopy;  Laterality: N/A;   ORIF FOOT FRACTURE Right 04/20/2014   done in [place] EXTRACTION      Current Medications: Current Meds  Medication Sig   aspirin EC 81 MG tablet Take 81 mg by mouth daily.   Calcium Carbonate-Vitamin D3 600-400 MG-UNIT TABS Take 1 tablet by mouth 2 (two) times daily.   digoxin (LANOXIN) 0.125 MG tablet TAKE 1 TABLET(0.125 MG) BY MOUTH DAILY   ELIQUIS 5 MG TABS tablet TAKE 1 TABLET BY MOUTH  TWICE DAILY   ezetimibe (ZETIA) 10 MG tablet Take 10 mg by mouth every evening.   gabapentin (NEURONTIN) 600 MG tablet Take 600 mg by mouth 3 (three) times daily.   HUMALOG MIX 50/50 KWIKPEN (50-50) 100 UNIT/ML KwikPen Inject 16 Units into the skin in the morning. Then takes 10 units in at lunch and takes 14 in the evening   JARDIANCE 25 MG TABS tablet Take 25 mg by mouth daily.   metFORMIN (GLUCOPHAGE) 500 MG tablet Take 1,000 mg by mouth 2 (two) times daily.   rosuvastatin (CRESTOR) 40 MG tablet Take 40 mg by mouth every evening.   vitamin B-12 (CYANOCOBALAMIN) 1000 MCG tablet Take 1,000 mcg by mouth daily.     Allergies:   Patient has no known allergies.   Social History   Socioeconomic History   Marital status: Widowed    Spouse name: Not on file   Number of children: Not on file   Years of education: Not on file   Highest education level: Not on file  Occupational History   Not on file  Tobacco Use   Smoking status: Former    Packs/day: 0.25    Years: 30.00    Pack years: 7.50    Types: Cigarettes    Start date: 03/30/1962    Quit date: 11/12/1991    Years since quitting: 30.1   Smokeless tobacco: Never  Vaping Use   Vaping Use: Never used  Substance and Sexual Activity   Alcohol use: No    Alcohol/week: 0.0  standard drinks   Drug use: No   Sexual activity: Not Currently    Birth control/protection: Post-menopausal  Other Topics Concern   Not on file  Social History Narrative   2 sons, one adopted   Social Determinants of Health   Financial Resource Strain: Not on file  Food Insecurity: Not on file  Transportation Needs: Not on file  Physical Activity: Not on file  Stress: Not on file  Social Connections: Not on file     Family History: The patient's family history includes Diabetes in her father; Heart disease in her father and mother; Heart failure in her father and mother; Obesity in her brother.  ROS:   Please see the history of present illness.    All other systems reviewed and are negative.  EKGs/Labs/Other Studies Reviewed:    The following studies were reviewed today: EKG reveals atrial fibrillation with well-controlled ventricular rate.   Recent Labs: 06/07/2021: ALT 13; TSH  1.650 10/12/2021: BUN 21; Creatinine, Ser 1.15; Hemoglobin 12.7; Platelets 252; Potassium 4.8; Sodium 139  Recent Lipid Panel    Component Value Date/Time   CHOL 115 06/14/2021 0821   TRIG 111 06/14/2021 0821   HDL 47 06/14/2021 0821   CHOLHDL 2.4 06/14/2021 0821   LDLCALC 48 06/14/2021 0821    Physical Exam:    VS:  BP 114/62    Pulse 60    Ht 5\' 10"  (1.778 m)    Wt 203 lb 0.6 oz (92.1 kg)    LMP  (LMP Unknown)    SpO2 97%    BMI 29.13 kg/m     Wt Readings from Last 3 Encounters:  12/20/21 203 lb 0.6 oz (92.1 kg)  12/05/21 200 lb (90.7 kg)  11/21/21 202 lb 0.6 oz (91.6 kg)     GEN: Patient is in no acute distress HEENT: Normal NECK: No JVD; No carotid bruits LYMPHATICS: No lymphadenopathy CARDIAC: Hear sounds regular, 2/6 systolic murmur at the apex. RESPIRATORY:  Clear to auscultation without rales, wheezing or rhonchi  ABDOMEN: Soft, non-tender, non-distended MUSCULOSKELETAL:  No edema; No deformity  SKIN: Warm and dry NEUROLOGIC:  Alert and oriented x 3 PSYCHIATRIC:  Normal  affect   Signed, Jenean Lindau, MD  12/20/2021 10:32 AM    [place]

## 2021-12-20 NOTE — Patient Instructions (Signed)
Medication Instructions:  Your physician recommends that you continue on your current medications as directed. Please refer to the Current Medication list given to you today.  *If you need a refill on your cardiac medications before your next appointment, please call your pharmacy*   Lab Work: None ordered If you have labs (blood work) drawn today and your tests are completely normal, you will receive your results only by: MyChart Message (if you have MyChart) OR A paper copy in the mail If you have any lab test that is abnormal or we need to change your treatment, we will call you to review the results.   Testing/Procedures: None ordered   Follow-Up: At [facility], you and your health needs are our priority.  As part of our continuing mission to provide you with exceptional heart care, we have created designated Provider Care Teams.  These Care Teams include your primary Cardiologist (physician) and Advanced Practice Providers (APPs -  Physician Assistants and Nurse Practitioners) who all work together to provide you with the care you need, when you need it.  We recommend signing up for the patient portal called "MyChart".  Sign up information is provided on this After Visit Summary.  MyChart is used to connect with patients for Virtual Visits (Telemedicine).  Patients are able to view lab/test results, encounter notes, upcoming appointments, etc.  Non-urgent messages can be sent to your provider as well.   To learn more about what you can do with MyChart, go to [email or web address].com.    Your next appointment:   4 month(s)  The format for your next appointment:   In Person  Provider:   Rajan [name], MD   Other Instructions NA  

## 2021-12-25 ENCOUNTER — Encounter (HOSPITAL_COMMUNITY): Payer: Self-pay

## 2022-01-08 ENCOUNTER — Ambulatory Visit: Payer: Medicare Other | Admitting: Cardiology

## 2022-02-05 ENCOUNTER — Other Ambulatory Visit: Payer: Self-pay

## 2022-02-05 MED ORDER — DIGOXIN 125 MCG PO TABS: 0.1250 mg | ORAL_TABLET | Freq: Every day | ORAL | 2 refills | Status: DC

## 2022-02-15 ENCOUNTER — Inpatient Hospital Stay: Payer: Medicare Other | Admitting: Hematology & Oncology

## 2022-02-15 ENCOUNTER — Inpatient Hospital Stay: Payer: Medicare Other

## 2022-03-11 ENCOUNTER — Emergency Department (HOSPITAL_BASED_OUTPATIENT_CLINIC_OR_DEPARTMENT_OTHER)
Admission: EM | Admit: 2022-03-11 | Discharge: 2022-03-11 | Disposition: A | Discharge status: Home / Self Care | Payer: No Typology Code available for payment source | Attending: Emergency Medicine | Admitting: Emergency Medicine

## 2022-03-11 ENCOUNTER — Encounter (HOSPITAL_BASED_OUTPATIENT_CLINIC_OR_DEPARTMENT_OTHER): Payer: Self-pay

## 2022-03-11 ENCOUNTER — Encounter: Payer: Self-pay | Admitting: Hematology & Oncology

## 2022-03-11 ENCOUNTER — Emergency Department (HOSPITAL_BASED_OUTPATIENT_CLINIC_OR_DEPARTMENT_OTHER): Payer: No Typology Code available for payment source

## 2022-03-11 ENCOUNTER — Other Ambulatory Visit: Payer: Self-pay

## 2022-03-11 DIAGNOSIS — S61411A Laceration without foreign body of right hand, initial encounter: Secondary | ICD-10-CM | POA: Insufficient documentation

## 2022-03-11 DIAGNOSIS — S6991XA Unspecified injury of right wrist, hand and finger(s), initial encounter: Secondary | ICD-10-CM

## 2022-03-11 DIAGNOSIS — W208XXA Other cause of strike by thrown, projected or falling object, initial encounter: Secondary | ICD-10-CM | POA: Insufficient documentation

## 2022-03-11 DIAGNOSIS — Z794 Long term (current) use of insulin: Secondary | ICD-10-CM | POA: Diagnosis not present

## 2022-03-11 DIAGNOSIS — Y990 Civilian activity done for income or pay: Secondary | ICD-10-CM | POA: Insufficient documentation

## 2022-03-11 DIAGNOSIS — Z7901 Long term (current) use of anticoagulants: Secondary | ICD-10-CM | POA: Diagnosis not present

## 2022-03-11 DIAGNOSIS — S0031XA Abrasion of nose, initial encounter: Secondary | ICD-10-CM | POA: Diagnosis not present

## 2022-03-11 DIAGNOSIS — Z7982 Long term (current) use of aspirin: Secondary | ICD-10-CM | POA: Diagnosis not present

## 2022-03-11 DIAGNOSIS — M7989 Other specified soft tissue disorders: Secondary | ICD-10-CM | POA: Diagnosis not present

## 2022-03-11 MED ORDER — ACETAMINOPHEN 325 MG PO TABS
650.0000 mg | ORAL_TABLET | Freq: Once | ORAL | Status: DC
  Filled 2022-03-11: qty 2

## 2022-03-11 NOTE — ED Notes (Addendum)
Wound cleaned with sterile NS and dried. Applied steri strips to all cuts, with non stick gauze, Ace wrap, all under a thumb spica. Fit for comfort and given instructions on taking care of wounds, and velcro brace. Pt understood.  ?

## 2022-03-11 NOTE — ED Triage Notes (Addendum)
Pt reports opening cabinet door and plastic signs fell on right hand this morning. Also hit tip of nose . No loss of consciousness. Pt is on blood thinners .Injury occurred at work ?

## 2022-03-11 NOTE — ED Provider Notes (Signed)
?[facility] DEPARTMENT ?Provider Note ? ? ?CSN: [id number] ?Arrival date & time: 03/11/22  1014 ? ?  ? ?History ? ?Chief Complaint  ?Patient presents with  ? Hand Injury  ? ? ?Vickie Burns is a 81 y.o. female who presents to the emergency department complaining of right hand injury onset prior to arrival.  She was opening up a cabinet door when plastic signs fell onto her right hand.  She notes that the plastic signs also hit the tip of her nose.  Did not try any medications prior to arrival to the ED.  Patient notes she was able to drive herself to the emergency department.  Denies headache, vision changes, nausea, vomiting, LOC. ? ? ? ? ?The history is provided by the patient. No language interpreter was used.  ? ?  ? ?Home Medications ?Prior to Admission medications   ?Medication Sig Start Date End Date Taking? Authorizing Provider  ?aspirin EC 81 MG tablet Take 81 mg by mouth daily.    [provider]  ?Calcium Carbonate-Vitamin D3 600-400 MG-UNIT TABS Take 1 tablet by mouth 2 (two) times daily.    [provider]  ?digoxin (LANOXIN) 0.125 MG tablet Take 1 tablet (0.125 mg total) by mouth daily. TAKE 1 TABLET(0.125 MG) BY MOUTH DAILY 02/05/22   [name], Reita Cliche, MD  ?ELIQUIS 5 MG TABS tablet TAKE 1 TABLET BY MOUTH  TWICE DAILY 08/13/21   [name], Reita Cliche, MD  ?ezetimibe (ZETIA) 10 MG tablet Take 10 mg by mouth every evening.    [provider]  ?gabapentin (NEURONTIN) 600 MG tablet Take 600 mg by mouth 3 (three) times daily.    [provider]  ?HUMALOG MIX 50/50 KWIKPEN (50-50) 100 UNIT/ML KwikPen Inject 16 Units into the skin in the morning. Then takes 10 units in at lunch and takes 14 in the evening 09/17/21   [provider]  ?JARDIANCE 25 MG TABS tablet Take 25 mg by mouth daily. 09/28/20   [provider]  ?metFORMIN (GLUCOPHAGE) 500 MG tablet Take 1,000 mg by mouth 2 (two) times daily.    [provider]  ?rosuvastatin  (CRESTOR) 40 MG tablet Take 40 mg by mouth every evening.    [provider]  ?vitamin B-12 (CYANOCOBALAMIN) 1000 MCG tablet Take 1,000 mcg by mouth daily.    [provider]  ?   ? ?Allergies    ?Patient has no known allergies.   ? ?Review of Systems   ?Review of Systems  ?Gastrointestinal:  Negative for nausea and vomiting.  ?Musculoskeletal:  Positive for arthralgias and joint swelling.  ?Skin:  Positive for wound.  ?Neurological:  Negative for syncope and headaches.  ?All other systems reviewed and are negative. ? ?Physical Exam ?Updated Vital Signs ?BP 121/67   Pulse 84   Temp (!) 97.5 ?F (36.4 ?C)   Resp 18   Ht '5\' 10"'$  (1.778 m)   Wt 90.7 kg   LMP  (LMP Unknown)   SpO2 98%   BMI 28.70 kg/m?  ?Physical Exam ?Vitals and nursing note reviewed.  ?Constitutional:   ?   General: She is not in acute distress. ?   Appearance: Normal appearance.  ?HENT:  ?   Nose:  ?   Comments: Abrasion noted to tip of nose. No septal hematoma.  No tenderness to palpation noted to nose or head. ?Eyes:  ?   General: No scleral icterus. ?   Extraocular Movements: Extraocular movements intact.  ?Cardiovascular:  ?  Rate and Rhythm: Normal rate.  ?Pulmonary:  ?   Effort: Pulmonary effort is normal. No respiratory distress.  ?Musculoskeletal:  ?   Cervical back: Neck supple.  ?   Comments: Hematoma noted to right third MCP with mild tenderness to palpation.  Skin tears noted to dorsum of right hand.  Bleeding controlled at this time.  Normal flexion and extension of right wrist.  Capillary refill less than 2 seconds.  Neurovascularly intact.  Normal finger to thumb opposition.  No tenderness to palpation noted to right phalanges or wrist.  Radial pulse intact bilaterally.  ?Skin: ?   General: Skin is warm and dry.  ?   Findings: No bruising, erythema or rash.  ?Neurological:  ?   Mental Status: She is alert.  ?Psychiatric:     ?   Behavior: Behavior normal.  ? ? ?ED Results / Procedures / Treatments   ?Labs ?(all  labs ordered are listed, but only abnormal results are displayed) ?Labs Reviewed - No data to display ? ?EKG ?None ? ?Radiology ?DG Wrist Complete Right ? ?Result Date: 03/11/2022 ?CLINICAL DATA:  Injury opening cabinet door and plastic signs fell on right hand. Lacerations bruising and swelling. EXAM: RIGHT HAND - COMPLETE 3+ VIEW; RIGHT WRIST - COMPLETE 3+ VIEW COMPARISON:  Right wrist and hand radiographs 10/23/2020 FINDINGS: Right wrist: There is diffuse decreased bone mineralization. Severe triscaphe and thumb carpometacarpal joint space narrowing. Large peripheral thumb carpometacarpal degenerative osteophytes. There is widening of the scapholunate interval up to approximately 4 mm suggesting scapholunate ligament insufficiency. No acute fracture is seen. No dislocation. Right hand: There is again severe index finger DIP joint space narrowing. Moderate third and mild fourth and fifth DIP joint space narrowing. Mild thumb interphalangeal joint space narrowing. No acute fracture is seen. No dislocation. IMPRESSION:: IMPRESSION: 1. 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 2. No acute fracture is seen. 3. 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 Electronically Signed   By: Yvonne Kendall M.D.   On: 03/11/2022 11:07  ? ?DG Hand Complete Right ? ?Result Date: 03/11/2022 ?CLINICAL DATA:  Injury opening cabinet door and plastic signs fell on right hand. Lacerations bruising and swelling. EXAM: RIGHT HAND - COMPLETE 3+ VIEW; RIGHT WRIST - COMPLETE 3+ VIEW COMPARISON:  Right wrist and hand radiographs 10/23/2020 FINDINGS: Right wrist: There is diffuse decreased bone mineralization. Severe triscaphe and thumb carpometacarpal joint space narrowing. Large peripheral thumb carpometacarpal degenerative osteophytes. There is widening of the scapholunate interval up to approximately 4 mm suggesting scapholunate ligament  insufficiency. No acute fracture is seen. No dislocation. Right hand: There is again severe index finger DIP joint space narrowing. Moderate third and mild fourth and fifth DIP joint space narrowing. Mild thumb interphalangeal joint space narrowing. No acute fracture is seen. No dislocation. IMPRESSION:: IMPRESSION: 1. 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 2. No acute fracture is seen. 3. 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 Electronically Signed   By: Yvonne Kendall M.D.   On: 03/11/2022 11:07   ? ?Procedures ?Procedures  ? ? ?Medications Ordered in ED ?Medications  ?acetaminophen (TYLENOL) tablet 650 mg (650 mg Oral Patient Refused/Not Given 03/11/22 1048)  ? ? ?ED Course/ Medical Decision Making/ A&P ?Clinical Course as of 03/11/22 1126  ?Mon Mar 11, 2022  ?1117 Re-evaluated and updated patient with discharge treatment plan. Answered all available questions. Pt appears safe for discharge. [SB]  ?  ?  Clinical Course User Index ?[SB] Galina Haddox A, PA-C  ? ?                        ?Medical Decision Making ?Amount and/or Complexity of Data Reviewed ?Radiology: ordered. ? ?Risk ?OTC drugs. ? ? ?Patient with right hand injury onset prior to arrival.  Patient notes that she opened up a cabinet when plastic signs fell onto her right hand and hit the bridge of her nose.  Patient is currently on Eliquis.  Vital signs stable, patient afebrile, not tachycardic or hypoxic.  On exam patient with abrasion noted to tip of nose, no septal hematoma.  No tenderness to palpation noted to nose or head.  Hematoma noted to right third MCP with mild tenderness to palpation.  Abrasion/skin tears noted to the dorsum of the right hand.  Bleeding controlled at this time.  Normal flexion and extension of right wrist.  Neurovascularly intact.  Normal finger to thumb opposition.  Differential diagnosis includes fracture,  dislocation, abrasions, contusion.  ? ? ?Imaging: ?I ordered imaging studies including right hand and wrist xray ?I independently visualized and interpreted imaging which showed: 1. Severe thumb carpometacarpal and i

## 2022-03-11 NOTE — ED Notes (Signed)
Hand injury at work placed today, rt digits noted to be swollen, some bruising also noted, ice and elevation noted. Normal cap refill assessed and noted, denies any numbness or tingling in rt digits. Rates hand pain a 4 on 0-10 scale. Refuses any meds at this time ? ?

## 2022-03-11 NOTE — Discharge Instructions (Addendum)
It was a pleasure taking care of you!  ? ?Your x-ray was negative for fracture or dislocation.  Your x-ray did note widening of the scapholunate area, call [facility] today to set up a follow-up appointment for repeat xray imaging of the area (information above).  You may take over the counter 500 mg Tylenol every 6 hours as needed for pain for no more than 7 days. You will be given a thumb spica splint today, wear during the day, you may remove it at night.  You may apply Ace wrap to the right hand to aid with swelling.  You may apply ice to affected area for up to 15 minutes at a time. Ensure to place a barrier between your skin and the ice.  You may follow-up with your primary care provider as needed.  Return to the Emergency Department if you are experiencing increasing/worsening pain, swelling, color change, fever, or worsening symptoms. ?

## 2022-03-11 NOTE — ED Notes (Signed)
Discharge paperwork given and understood. 

## 2022-03-28 ENCOUNTER — Encounter: Payer: Self-pay | Admitting: Hematology & Oncology

## 2022-03-30 ENCOUNTER — Emergency Department (HOSPITAL_BASED_OUTPATIENT_CLINIC_OR_DEPARTMENT_OTHER): Payer: Medicare Other

## 2022-03-30 ENCOUNTER — Emergency Department (HOSPITAL_BASED_OUTPATIENT_CLINIC_OR_DEPARTMENT_OTHER)
Admission: EM | Admit: 2022-03-30 | Discharge: 2022-03-30 | Disposition: A | Discharge status: Home / Self Care | Payer: Medicare Other | Attending: Emergency Medicine | Admitting: Emergency Medicine

## 2022-03-30 ENCOUNTER — Other Ambulatory Visit: Payer: Self-pay

## 2022-03-30 ENCOUNTER — Encounter (HOSPITAL_BASED_OUTPATIENT_CLINIC_OR_DEPARTMENT_OTHER): Payer: Self-pay | Admitting: Emergency Medicine

## 2022-03-30 DIAGNOSIS — Z794 Long term (current) use of insulin: Secondary | ICD-10-CM | POA: Insufficient documentation

## 2022-03-30 DIAGNOSIS — I10 Essential (primary) hypertension: Secondary | ICD-10-CM | POA: Diagnosis not present

## 2022-03-30 DIAGNOSIS — I4819 Other persistent atrial fibrillation: Secondary | ICD-10-CM | POA: Diagnosis not present

## 2022-03-30 DIAGNOSIS — E1140 Type 2 diabetes mellitus with diabetic neuropathy, unspecified: Secondary | ICD-10-CM | POA: Diagnosis not present

## 2022-03-30 DIAGNOSIS — Z7982 Long term (current) use of aspirin: Secondary | ICD-10-CM | POA: Diagnosis not present

## 2022-03-30 DIAGNOSIS — S0990XA Unspecified injury of head, initial encounter: Secondary | ICD-10-CM | POA: Insufficient documentation

## 2022-03-30 DIAGNOSIS — S8991XA Unspecified injury of right lower leg, initial encounter: Secondary | ICD-10-CM | POA: Diagnosis present

## 2022-03-30 DIAGNOSIS — S60212A Contusion of left wrist, initial encounter: Secondary | ICD-10-CM | POA: Diagnosis not present

## 2022-03-30 DIAGNOSIS — Z7901 Long term (current) use of anticoagulants: Secondary | ICD-10-CM | POA: Diagnosis not present

## 2022-03-30 DIAGNOSIS — W19XXXA Unspecified fall, initial encounter: Secondary | ICD-10-CM

## 2022-03-30 DIAGNOSIS — W010XXA Fall on same level from slipping, tripping and stumbling without subsequent striking against object, initial encounter: Secondary | ICD-10-CM | POA: Insufficient documentation

## 2022-03-30 DIAGNOSIS — S8001XA Contusion of right knee, initial encounter: Secondary | ICD-10-CM | POA: Diagnosis not present

## 2022-03-30 DIAGNOSIS — M25461 Effusion, right knee: Secondary | ICD-10-CM

## 2022-03-30 DIAGNOSIS — Z7984 Long term (current) use of oral hypoglycemic drugs: Secondary | ICD-10-CM | POA: Diagnosis not present

## 2022-03-30 DIAGNOSIS — Z853 Personal history of malignant neoplasm of breast: Secondary | ICD-10-CM | POA: Insufficient documentation

## 2022-03-30 NOTE — ED Provider Notes (Signed)
[facility] Provider Note   CSN: [id number] Arrival date & time: 03/30/22  1734     History  Chief Complaint  Patient presents with   Vickie Burns is a 81 y.o. female.  HPI     81 year old female with a history of atrial fibrillation on Eliquis, breast cancer, diabetes, hypertension, hyperlipidemia who presents with concern for fall.  Reports that she tripped over a strap on a cooler.  Reports she has pain to her left wrist and right knee.  She was able to ambulate since the fall.  Denies loss of consciousness, neck pain, back pain, chest pain, abdominal pain, numbness, weakness, difficulty talking or walking, visual changes or other recent acute illness.  Past Medical History:  Diagnosis Date   Atrial fibrillation ([place]) 02/12/2021   Breast CA ([place]) 04/2009   right - radiation and lumpectomy   Coronary arteriosclerosis 09/25/2021   Diabetes mellitus due to underlying condition with unspecified complications ([place]) 06/24/4817   Diabetes mellitus, type 2 ([place]) 01/27/2012   Diabetic peripheral neuropathy associated with type 2 diabetes mellitus ([place]) 02/12/2021   Diabetic retinopathy associated with type 2 diabetes mellitus ([place]) 02/12/2021   Essential hypertension 10/07/2016   Family history of breast cancer    aunt   History of adenomatous polyp of colon 02/12/2021   HLD (hyperlipidemia) 08/17/2012   Hypercholesteremia 2000   Hypercholesterolemia 2000   Hypertension    history   Long term (current) use of anticoagulants 12/14/2021   Long term (current) use of insulin ([place]) 02/12/2021   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 Authorizing Provider  aspirin EC 81 MG tablet Take 81 mg by mouth daily.    [provider]  Calcium Carbonate-Vitamin D3 600-400 MG-UNIT TABS Take 1 tablet by mouth 2 (two) times daily.    [provider]  digoxin (LANOXIN) 0.125 MG tablet Take 1 tablet (0.125 mg total) by mouth daily. TAKE 1 TABLET(0.125 MG) BY MOUTH DAILY 02/05/22   [name], Reita Cliche, MD  ELIQUIS 5 MG TABS tablet TAKE 1 TABLET BY MOUTH  TWICE DAILY 08/13/21   [name], Reita Cliche, MD  ezetimibe (ZETIA) 10 MG tablet Take 10 mg by mouth every evening.    [provider]  gabapentin (NEURONTIN) 600 MG tablet Take 600 mg by mouth 3 (three) times daily.    [provider]  HUMALOG MIX 50/50 KWIKPEN (50-50) 100 UNIT/ML KwikPen Inject 16 Units into the skin in the morning. Then takes 10 units in at lunch and takes 14 in the evening 09/17/21   [provider]  JARDIANCE 25 MG TABS tablet Take 25 mg by mouth daily. 09/28/20   [provider]  metFORMIN (GLUCOPHAGE) 500 MG tablet Take 1,000 mg by mouth 2 (two) times daily.    [provider]  rosuvastatin (CRESTOR) 40 MG tablet Take 40 mg by mouth every evening.    [provider]  vitamin B-12 (CYANOCOBALAMIN)  1000 MCG tablet Take 1,000 mcg by mouth daily.    [provider]      Allergies    Patient has no known allergies.    Review of Systems   Review of Systems  Physical Exam Updated Vital Signs BP 118/65   Pulse 60   Temp 98 F (36.7 C) (Oral)   Resp 16   Ht '5\' 10"'$  (1.778 m)   Wt 88 kg   LMP  (LMP Unknown)   SpO2 100%   BMI 27.84 kg/m  Physical Exam Vitals and nursing note reviewed.  Constitutional:      General: She is not in acute distress.    Appearance: She is well-developed.  She is not diaphoretic.  HENT:     Head: Normocephalic and atraumatic.  Eyes:     Conjunctiva/sclera: Conjunctivae normal.  Cardiovascular:     Rate and Rhythm: Normal rate and regular rhythm.  Pulmonary:     Effort: Pulmonary effort is normal. No respiratory distress.  Chest:     Chest wall: No tenderness.  Abdominal:     General: There is no distension.     Palpations: Abdomen is soft.     Tenderness: There is no abdominal tenderness. There is no guarding.  Musculoskeletal:        General: Tenderness present. Injury: significant swelling/contusion to right knee.    Cervical back: Normal range of motion.     Comments: Able to flex, extend knee  Skin:    General: Skin is warm and dry.     Findings: No erythema or rash.  Neurological:     Mental Status: She is alert and oriented to person, place, and time.    ED Results / Procedures / Treatments   Labs (all labs ordered are listed, but only abnormal results are displayed) Labs Reviewed - No data to display  EKG None  Radiology CT Head Wo Contrast  Result Date: 03/30/2022 CLINICAL DATA:  Head trauma. EXAM: CT HEAD WITHOUT CONTRAST TECHNIQUE: Contiguous axial images were obtained from the base of the skull through the vertex without intravenous contrast. RADIATION DOSE REDUCTION: This exam was performed according to the departmental dose-optimization program which includes automated exposure control, adjustment of the mA and/or kV according to patient size and/or use of iterative reconstruction technique. COMPARISON:  Head CT dated 10/07/2016. FINDINGS: Brain: Mild age-related atrophy and chronic microvascular ischemic changes. There is no acute intracranial hemorrhage. No mass effect or midline shift. No extra-axial fluid collection. Vascular: No hyperdense vessel or unexpected calcification. Skull: Normal. Negative for fracture or focal lesion. Sinuses/Orbits: No acute finding. Other: None IMPRESSION: 1. No acute intracranial  pathology. 2. Mild age-related atrophy and chronic microvascular ischemic changes. Electronically Signed   By: Anner Crete M.D.   On: 03/30/2022 19:16   DG Knee Complete 4 Views Right  Result Date: 03/30/2022 CLINICAL DATA:  Tripped and fall with knee pain. EXAM: RIGHT KNEE - COMPLETE 4+ VIEW COMPARISON:  None Available. FINDINGS: No evidence of fracture, dislocation, or joint effusion. Moderate tricompartmental osteoarthritis is noted. There is significant soft tissue swelling anterior to the knee. IMPRESSION: No acute osseous injury. Electronically Signed   By: Zerita Boers M.D.   On: 03/30/2022 18:46   DG Hand Complete Left  Result Date: 03/30/2022 CLINICAL DATA:  Fall. EXAM: LEFT HAND - COMPLETE 3+ VIEW COMPARISON:  None Available. FINDINGS: There is no evidence of fracture or dislocation. There are moderate degenerative changes at the first carpometacarpal joint. Soft tissues are  unremarkable. IMPRESSION: No acute fracture or dislocation. Electronically Signed   By: Ronney Asters M.D.   On: 03/30/2022 18:47    Procedures Procedures    Medications Ordered in ED Medications - No data to display  ED Course/ Medical Decision Making/ A&P                           Medical Decision Making Amount and/or Complexity of Data Reviewed Radiology: ordered and independent interpretation performed. Decision-making details documented in ED Course.    81 year old female with a history of atrial fibrillation on Eliquis, breast cancer, diabetes, hypertension, hyperlipidemia who presents with concern for fall.  Given fall on anticoagulation, CT head was obtained which showed no sign of intracranial hemorrhage.  X-ray of the left wrist was completed and personally interpreted and by me and showed no sign of acute abnormality.  She has no snuffbox tenderness and have low suspicion for occult scaphoid fracture.  X-ray of the right knee was evaluated interpreted by me and showed no evidence of  fracture.  She has a significant hematoma surrounding her right knee secondary to being on anticoagulation.  Have low suspicion for occult tibial plateau fracture.  She has been able to ambulate.  We will place Ace bandage for pressure, recommend ice and elevation, PCP follow-up, and also provided number for orthopedics if pain continues to consider other ligamentous injuries, meniscal injuries, complications of hematoma.          Final Clinical Impression(s) / ED Diagnoses Final diagnoses:  Fall, initial encounter  Pain and swelling of right knee  Traumatic hematoma of right knee, initial encounter  Contusion of left wrist, initial encounter    Rx / DC Orders ED Discharge Orders     None         Gareth Morgan, MD 04/01/22 [phone]

## 2022-03-30 NOTE — ED Triage Notes (Signed)
Pt arrives pov, slow gait c/o mechanical fall. After tripping over strap on cooler. Denies loc, denies head trauma, endorses thinners. Pt c/o left wrist pain, Right knee pain and swelling.

## 2022-04-05 ENCOUNTER — Other Ambulatory Visit: Payer: Self-pay

## 2022-04-05 ENCOUNTER — Encounter (HOSPITAL_BASED_OUTPATIENT_CLINIC_OR_DEPARTMENT_OTHER): Payer: Self-pay

## 2022-04-05 ENCOUNTER — Observation Stay (HOSPITAL_BASED_OUTPATIENT_CLINIC_OR_DEPARTMENT_OTHER)
Admission: EM | Admit: 2022-04-05 | Discharge: 2022-04-07 | Disposition: A | Discharge status: Home / Self Care | Payer: Medicare Other | Attending: Internal Medicine | Admitting: Internal Medicine

## 2022-04-05 DIAGNOSIS — W19XXXA Unspecified fall, initial encounter: Secondary | ICD-10-CM

## 2022-04-05 DIAGNOSIS — W010XXA Fall on same level from slipping, tripping and stumbling without subsequent striking against object, initial encounter: Secondary | ICD-10-CM | POA: Diagnosis not present

## 2022-04-05 DIAGNOSIS — I10 Essential (primary) hypertension: Secondary | ICD-10-CM | POA: Diagnosis not present

## 2022-04-05 DIAGNOSIS — I2510 Atherosclerotic heart disease of native coronary artery without angina pectoris: Secondary | ICD-10-CM | POA: Insufficient documentation

## 2022-04-05 DIAGNOSIS — S8011XA Contusion of right lower leg, initial encounter: Secondary | ICD-10-CM

## 2022-04-05 DIAGNOSIS — E1142 Type 2 diabetes mellitus with diabetic polyneuropathy: Secondary | ICD-10-CM | POA: Diagnosis not present

## 2022-04-05 DIAGNOSIS — E7800 Pure hypercholesterolemia, unspecified: Secondary | ICD-10-CM | POA: Diagnosis not present

## 2022-04-05 DIAGNOSIS — Z79899 Other long term (current) drug therapy: Secondary | ICD-10-CM | POA: Insufficient documentation

## 2022-04-05 DIAGNOSIS — Z7901 Long term (current) use of anticoagulants: Secondary | ICD-10-CM | POA: Insufficient documentation

## 2022-04-05 DIAGNOSIS — I4819 Other persistent atrial fibrillation: Secondary | ICD-10-CM | POA: Diagnosis not present

## 2022-04-05 DIAGNOSIS — Z7982 Long term (current) use of aspirin: Secondary | ICD-10-CM | POA: Insufficient documentation

## 2022-04-05 DIAGNOSIS — Z853 Personal history of malignant neoplasm of breast: Secondary | ICD-10-CM | POA: Insufficient documentation

## 2022-04-05 DIAGNOSIS — I4891 Unspecified atrial fibrillation: Secondary | ICD-10-CM | POA: Diagnosis not present

## 2022-04-05 DIAGNOSIS — Z87891 Personal history of nicotine dependence: Secondary | ICD-10-CM | POA: Diagnosis not present

## 2022-04-05 DIAGNOSIS — E1140 Type 2 diabetes mellitus with diabetic neuropathy, unspecified: Secondary | ICD-10-CM | POA: Insufficient documentation

## 2022-04-05 DIAGNOSIS — E11319 Type 2 diabetes mellitus with unspecified diabetic retinopathy without macular edema: Secondary | ICD-10-CM | POA: Insufficient documentation

## 2022-04-05 DIAGNOSIS — Y92009 Unspecified place in unspecified non-institutional (private) residence as the place of occurrence of the external cause: Secondary | ICD-10-CM

## 2022-04-05 DIAGNOSIS — D62 Acute posthemorrhagic anemia: Secondary | ICD-10-CM

## 2022-04-05 DIAGNOSIS — D649 Anemia, unspecified: Principal | ICD-10-CM

## 2022-04-05 DIAGNOSIS — Z7984 Long term (current) use of oral hypoglycemic drugs: Secondary | ICD-10-CM | POA: Diagnosis not present

## 2022-04-05 DIAGNOSIS — Z794 Long term (current) use of insulin: Secondary | ICD-10-CM | POA: Diagnosis not present

## 2022-04-05 DIAGNOSIS — I482 Chronic atrial fibrillation, unspecified: Secondary | ICD-10-CM

## 2022-04-05 HISTORY — DX: Acute posthemorrhagic anemia: D62

## 2022-04-05 HISTORY — DX: Contusion of right lower leg, initial encounter: S80.11XA

## 2022-04-05 HISTORY — DX: Unspecified fall, initial encounter: W19.XXXA

## 2022-04-05 LAB — CBC WITH DIFFERENTIAL/PLATELET
Abs Immature Granulocytes: 0.01 10*3/uL (ref 0.00–0.07)
Basophils Absolute: 0.1 10*3/uL (ref 0.0–0.1)
Basophils Relative: 1 %
Eosinophils Absolute: 0.1 10*3/uL (ref 0.0–0.5)
Eosinophils Relative: 2 %
HCT: 26 % — ABNORMAL LOW (ref 36.0–46.0)
Hemoglobin: 8 g/dL — ABNORMAL LOW (ref 12.0–15.0)
Immature Granulocytes: 0 %
Lymphocytes Relative: 12 %
Lymphs Abs: 0.8 10*3/uL (ref 0.7–4.0)
MCH: 27.3 pg (ref 26.0–34.0)
MCHC: 30.8 g/dL (ref 30.0–36.0)
MCV: 88.7 fL (ref 80.0–100.0)
Monocytes Absolute: 0.7 10*3/uL (ref 0.1–1.0)
Monocytes Relative: 9 %
Neutro Abs: 5.3 10*3/uL (ref 1.7–7.7)
Neutrophils Relative %: 76 %
Platelets: 241 10*3/uL (ref 150–400)
RBC: 2.93 MIL/uL — ABNORMAL LOW (ref 3.87–5.11)
RDW: 16.2 % — ABNORMAL HIGH (ref 11.5–15.5)
WBC: 6.9 10*3/uL (ref 4.0–10.5)
nRBC: 0 % (ref 0.0–0.2)

## 2022-04-05 LAB — BASIC METABOLIC PANEL
Anion gap: 6 (ref 5–15)
BUN: 25 mg/dL — ABNORMAL HIGH (ref 8–23)
CO2: 22 mmol/L (ref 22–32)
Calcium: 9.3 mg/dL (ref 8.9–10.3)
Chloride: 109 mmol/L (ref 98–111)
Creatinine, Ser: 0.94 mg/dL (ref 0.44–1.00)
GFR, Estimated: >60 mL/min (ref >60)
Glucose, Bld: 109 mg/dL — ABNORMAL HIGH (ref 70–99)
Potassium: 3.8 mmol/L (ref 3.5–5.1)
Sodium: 137 mmol/L (ref 135–145)

## 2022-04-05 LAB — GLUCOSE, CAPILLARY
Glucose-Capillary: 188 mg/dL — ABNORMAL HIGH (ref 70–99)
Glucose-Capillary: 230 mg/dL — ABNORMAL HIGH (ref 70–99)

## 2022-04-05 MED ORDER — ROSUVASTATIN CALCIUM 10 MG PO TABS
40.0000 mg | ORAL_TABLET | Freq: Every evening | ORAL | Status: DC
  Administered 2022-04-05 – 2022-04-06 (×2): 40 mg via ORAL
  Filled 2022-04-05 (×2): qty 4

## 2022-04-05 MED ORDER — ACETAMINOPHEN 325 MG PO TABS
650.0000 mg | ORAL_TABLET | Freq: Four times a day (QID) | ORAL | Status: DC | PRN
  Administered 2022-04-05 – 2022-04-06 (×2): 650 mg via ORAL
  Filled 2022-04-05 (×2): qty 2

## 2022-04-05 MED ORDER — ONDANSETRON HCL 4 MG PO TABS: 4.0000 mg | ORAL_TABLET | Freq: Four times a day (QID) | ORAL | Status: DC | PRN

## 2022-04-05 MED ORDER — ACETAMINOPHEN 650 MG RE SUPP: 650.0000 mg | Freq: Four times a day (QID) | RECTAL | Status: DC | PRN

## 2022-04-05 MED ORDER — DIGOXIN 125 MCG PO TABS
0.1250 mg | ORAL_TABLET | Freq: Every day | ORAL | Status: DC
Start: 2022-04-06 — End: 2022-04-07
  Administered 2022-04-06 – 2022-04-07 (×2): 0.125 mg via ORAL
  Filled 2022-04-05 (×2): qty 1

## 2022-04-05 MED ORDER — GABAPENTIN 300 MG PO CAPS
600.0000 mg | ORAL_CAPSULE | Freq: Three times a day (TID) | ORAL | Status: DC
  Administered 2022-04-05 – 2022-04-07 (×6): 600 mg via ORAL
  Filled 2022-04-05 (×6): qty 2

## 2022-04-05 MED ORDER — ONDANSETRON HCL 4 MG/2ML IJ SOLN: 4.0000 mg | Freq: Four times a day (QID) | INTRAMUSCULAR | Status: DC | PRN

## 2022-04-05 MED ORDER — INSULIN ASPART 100 UNIT/ML IJ SOLN
0.0000 [IU] | Freq: Every day | INTRAMUSCULAR | Status: DC
  Administered 2022-04-05: 2 [IU] via SUBCUTANEOUS
  Administered 2022-04-06: 3 [IU] via SUBCUTANEOUS

## 2022-04-05 MED ORDER — EZETIMIBE 10 MG PO TABS
10.0000 mg | ORAL_TABLET | Freq: Every evening | ORAL | Status: DC
  Administered 2022-04-05 – 2022-04-06 (×2): 10 mg via ORAL
  Filled 2022-04-05 (×2): qty 1

## 2022-04-05 MED ORDER — INSULIN ASPART 100 UNIT/ML IJ SOLN
0.0000 [IU] | Freq: Three times a day (TID) | INTRAMUSCULAR | Status: DC
  Administered 2022-04-06: 5 [IU] via SUBCUTANEOUS
  Administered 2022-04-06: 3 [IU] via SUBCUTANEOUS
  Administered 2022-04-06 – 2022-04-07 (×2): 8 [IU] via SUBCUTANEOUS
  Administered 2022-04-07: 3 [IU] via SUBCUTANEOUS

## 2022-04-05 NOTE — ED Notes (Signed)
Report given to [facility] RN receiving nurse at [facility]

## 2022-04-05 NOTE — ED Notes (Signed)
ED Provider at bedside. 

## 2022-04-05 NOTE — ED Notes (Signed)
Report given to [place] RN with [facility]  

## 2022-04-05 NOTE — Progress Notes (Signed)
Plan of Care Note for accepted transfer   Patient: Vickie Burns MRN: [id number]   DOA: 04/05/2022  Facility requesting transfer: [facility] Requesting Provider: [place] Reason for transfer: HGB DROP Facility course: 81 YO seen at [facility] 1 week ago for a fall discharged home without any acute findings no labs were drawn at that time.  Patient represents today for enlarging bruise on right thigh with a decrease in hemoglobin to 8.0 from baseline 11-12 in the setting of being on anticoagulation with Eliquis for atrial fibrillation.  X-rays done no acute findings, patient has full range of motion but because of drop in hemoglobin consulted for transfer for further eval and treatment.  Plan of care: The patient is accepted for admission to Telemetry unit, at [facility]..   Blood loss anemia: -Hold Eliquis -Recheck CBC upon arrival -Patient has full range of motion per ER report no evidence of joint compromise -No evidence of compression -No need to repeat x-rays reported as negative   Author: Nicolette Bang, MD 04/05/2022  Check [email or web address] for on-call coverage.  Nursing staff, Please call [place] number on Amion as soon as patient's arrival, so appropriate admitting provider can evaluate the pt.

## 2022-04-05 NOTE — H&P (Signed)
History and Physical    Leonarda Leis QIW:[id number] DOB: [birth year] DOA: 04/05/2022  PCP: Lajean Manes, MD  Patient coming from: Home  I have personally briefly reviewed patient's old medical records in [place]  Chief Complaint: Bruising of right leg  HPI: Vickie Burns is a 81 y.o. female with medical history significant of atrial fibrillation on anticoagulation, insulin-dependent diabetes, she reports having a mechanical fall approximately 1 week ago.  At that time, she felt okay.  She was seen in the emergency room on 5/20 when she developed swelling in her right knee.  X-rays at that time did not show any acute traumatic bony injuries.  Hemoglobin was not checked at that time.  She returned home and reported to keep her leg elevated and was icing her leg.  She had noted that she had progressive bruising and swelling of her right lower extremity.  During this time she was still able to ambulate and bear weight on her right leg.  She was not using any cane or walker.  She noticed that she was becoming tired when she was ambulating, but denied any dizziness, lightheadedness or shortness of breath.  No chest pain.  She noticed worsening bruising on her right leg and therefore came to the emergency room for repeat evaluation.  Labs were drawn during her visit to the ED and it was noted that hemoglobin was 8.0.  Appears that her baseline hemoglobin is more around 12.  Patient does report that she continues to take her Eliquis after her fall, her last dose was earlier today.  Patient was sent to the hospital for further evaluation    Review of Systems: As per HPI otherwise 10 point review of systems negative.    Past Medical History:  Diagnosis Date   Atrial fibrillation ([place]) 02/12/2021   Breast CA ([place]) 04/2009   right - radiation and lumpectomy   Coronary arteriosclerosis 09/25/2021   Diabetes mellitus due to underlying condition with unspecified complications ([place])  02/26/4080   Diabetes mellitus, type 2 ([place]) 01/27/2012   Diabetic peripheral neuropathy associated with type 2 diabetes mellitus ([place]) 02/12/2021   Diabetic retinopathy associated with type 2 diabetes mellitus ([place]) 02/12/2021   Essential hypertension 10/07/2016   Family history of breast cancer    aunt   History of adenomatous polyp of colon 02/12/2021   HLD (hyperlipidemia) 08/17/2012   Hypercholesteremia 2000   Hypercholesterolemia 2000   Hypertension    history   Long term (current) use of anticoagulants 12/14/2021   Long term (current) use of insulin ([place]) 02/12/2021   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   New onset a-fib ([place]) 06/28/2020   Osteoporosis 09/06/2016   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   Persistent atrial fibrillation ([place]) 06/07/2021   Personal history of malignant neoplasm of breast 02/12/2021   Personal history of radiation therapy    Postmenopausal    took HRT 1999 - 2004   Preop cardiovascular exam 07/03/2020   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   Unspecified atrial fibrillation ([place]) 07/03/2020    Past Surgical History:  Procedure Laterality Date   BREAST BIOPSY     BREAST LUMPECTOMY  04/2009   rt breast  estrogen +, Her 2 Nu negative   BREAST LUMPECTOMY WITH RADIOACTIVE SEED LOCALIZATION Left 09/07/2020   Procedure: RADIOCATIVE SEED GUIDED LEFT BREAST LUMPECTOMY;  Surgeon: Alphonsa Overall, MD;  Location: [facility];  Service: General;  Laterality: Left;   COLONOSCOPY WITH PROPOFOL N/A 04/05/2014  Procedure: COLONOSCOPY WITH PROPOFOL;  Surgeon: Garlan Fair, MD;  Location: [facility];  Service: Endoscopy;  Laterality: N/A;   ORIF FOOT FRACTURE Right 04/20/2014   done in [place] History:  reports that she quit smoking about 30 years ago. Her smoking use included cigarettes. She started smoking about 60 years ago. She has a 7.50 pack-year smoking history. She has never used smokeless tobacco. She reports  that she does not drink alcohol and does not use drugs.  No Known Allergies  Family History  Problem Relation Age of Onset   Heart disease Mother        Confestive Heart Failure   Heart failure Mother    Diabetes Father    Heart disease Father        Congestive Heart Failure   Heart failure Father    Obesity Brother      Prior to Admission medications   Medication Sig Start Date End Date Taking? Authorizing Provider  aspirin EC 81 MG tablet Take 81 mg by mouth daily.    [provider]  Calcium Carbonate-Vitamin D3 600-400 MG-UNIT TABS Take 1 tablet by mouth 2 (two) times daily.    [provider]  digoxin (LANOXIN) 0.125 MG tablet Take 1 tablet (0.125 mg total) by mouth daily. TAKE 1 TABLET(0.125 MG) BY MOUTH DAILY 02/05/22   [name], Reita Cliche, MD  ELIQUIS 5 MG TABS tablet TAKE 1 TABLET BY MOUTH  TWICE DAILY 08/13/21   [name], Reita Cliche, MD  ezetimibe (ZETIA) 10 MG tablet Take 10 mg by mouth every evening.    [provider]  gabapentin (NEURONTIN) 600 MG tablet Take 600 mg by mouth 3 (three) times daily.    [provider]  HUMALOG MIX 50/50 KWIKPEN (50-50) 100 UNIT/ML KwikPen Inject 16 Units into the skin in the morning. Then takes 10 units in at lunch and takes 14 in the evening 09/17/21   [provider]  JARDIANCE 25 MG TABS tablet Take 25 mg by mouth daily. 09/28/20   [provider]  metFORMIN (GLUCOPHAGE) 500 MG tablet Take 1,000 mg by mouth 2 (two) times daily.    [provider]  rosuvastatin (CRESTOR) 40 MG tablet Take 40 mg by mouth every evening.    [provider]  vitamin B-12 (CYANOCOBALAMIN) 1000 MCG tablet Take 1,000 mcg by mouth daily.    [provider]    Physical Exam: Vitals:   04/05/22 1450 04/05/22 1515 04/05/22 1601 04/05/22 1731  BP: 126/75 (!) 116/95 (!) 128/57 137/65  Pulse: 65 74 (!) 59 (!) 44  Resp: '18 18 16 18  '$ Temp:   98.1 F (36.7 C) 97.7 F (36.5 C)  TempSrc:    Oral Oral  SpO2: 97% 100% 100% 100%  Weight:      Height:    (P) 5' 10.5" (1.791 m)    Constitutional: NAD, calm, comfortable Eyes: PERRL, lids and conjunctivae normal ENMT: Mucous membranes are moist. Posterior pharynx clear of any exudate or lesions.Normal dentition.  Neck: normal, supple, no masses, no thyromegaly Respiratory: clear to auscultation bilaterally, no wheezing, no crackles. Normal respiratory effort. No accessory muscle use.  Cardiovascular: Irregular, no murmurs / rubs / gallops. 2+ pedal pulses. No carotid bruits.  Abdomen: no tenderness, no masses palpated. No hepatosplenomegaly. Bowel sounds positive.  Musculoskeletal: Swelling of right knee, nontender.  There is fluid-filled blister over anterior right knee.  She has good range of  motion in her right ankle, right knee and right hip.  No tenderness to palpation over joints.  Compartments are all soft Skin: Significant ecchymosis over right lower extremity as pictured below Neurologic: CN 2-12 grossly intact. Sensation intact, DTR normal. Strength 5/5 in all 4.  Psychiatric: Normal judgment and insight. Alert and oriented x 3. Normal mood.             Labs on Admission: I have personally reviewed following labs and imaging studies  CBC: Recent Labs  Lab 04/05/22 1403  WBC 6.9  NEUTROABS 5.3  HGB 8.0*  HCT 26.0*  MCV 88.7  PLT 629   Basic Metabolic Panel: Recent Labs  Lab 04/05/22 1403  NA 137  K 3.8  CL 109  CO2 22  GLUCOSE 109*  BUN 25*  CREATININE 0.94  CALCIUM 9.3   GFR: Estimated Creatinine Clearance: 58.5 mL/min (by C-G formula based on SCr of 0.94 mg/dL). Liver Function Tests: No results for input(s): AST, ALT, ALKPHOS, BILITOT, PROT, ALBUMIN in the last 168 hours. No results for input(s): LIPASE, AMYLASE in the last 168 hours. No results for input(s): AMMONIA in the last 168 hours. Coagulation Profile: No results for input(s): INR, PROTIME in the last 168 hours. Cardiac  Enzymes: No results for input(s): CKTOTAL, CKMB, CKMBINDEX, TROPONINI in the last 168 hours. BNP (last 3 results) No results for input(s): PROBNP in the last 8760 hours. HbA1C: No results for input(s): HGBA1C in the last 72 hours. CBG: No results for input(s): GLUCAP in the last 168 hours. Lipid Profile: No results for input(s): CHOL, HDL, LDLCALC, TRIG, CHOLHDL, LDLDIRECT in the last 72 hours. Thyroid Function Tests: No results for input(s): TSH, T4TOTAL, FREET4, T3FREE, THYROIDAB in the last 72 hours. Anemia Panel: No results for input(s): VITAMINB12, FOLATE, FERRITIN, TIBC, IRON, RETICCTPCT in the last 72 hours. Urine analysis:    Component Value Date/Time   COLORURINE YELLOW 05/09/2009 1115   APPEARANCEUR CLOUDY (A) 05/09/2009 1115   LABSPEC 1.007 05/09/2009 1115   PHURINE 6.0 05/09/2009 1115   GLUCOSEU NEGATIVE 05/09/2009 1115   HGBUR NEGATIVE 05/09/2009 1115   BILIRUBINUR NEGATIVE 05/09/2009 1115   KETONESUR NEGATIVE 05/09/2009 1115   PROTEINUR NEGATIVE 05/09/2009 1115   UROBILINOGEN 0.2 05/09/2009 1115   NITRITE NEGATIVE 05/09/2009 1115   LEUKOCYTESUR  05/09/2009 1115    NEGATIVE MICROSCOPIC NOT DONE ON URINES WITH NEGATIVE PROTEIN, BLOOD, LEUKOCYTES, NITRITE, OR GLUCOSE <1000 mg/dL.    Radiological Exams on Admission: No results found.    Assessment/Plan Principal Problem:   Acute blood loss anemia Active Problems:   Hypercholesterolemia   Diabetic peripheral neuropathy associated with type 2 diabetes mellitus (HCC)   Persistent atrial fibrillation (HCC)   Traumatic ecchymosis of right lower leg   Fall at home, initial encounter     Acute blood loss anemia in the setting of anticoagulation -Baseline hemoglobin around 12 -Admission hemoglobin noted to be 8 -Significant ecchymosis/bleeding from trauma in her right leg -Hold anticoagulation -Monitor hemoglobin  Permanent atrial fibrillation -Heart rate is currently stable -Continue on digoxin -Discussed  stopping anticoagulation for now in light of significant bleeding.  -Explained that being off anticoagulation does increase her risk of stroke, although in current setting, continuing anticoagulation may pose a greater risk of bleeding and worsening anemia -Patient in agreement to temporarily hold anticoagulation -I suspect that she may be able to restart Eliquis in the next week if her hemoglobin stabilizes and bleeding resolves  Fall -Patient had mechanical fall approximately 1 week ago -  She has since been ambulating without assistance of cane or walker  Insulin-dependent diabetes -She is on metformin, Jardiance and insulin -Holding these agents on admission -SSI with NovoLog -Check A1c  Hyperlipidemia -Continue statin  DVT prophylaxis: scd  Code Status: full code  Family Communication: discussed with patient.  Her power of attorney is her son Corene Cornea, but she does not wish that her family be contacted at this time. Disposition Plan: Discharged home once medically stabilized Consults called:   Admission status: Inpatient, telemetry  Kathie Dike MD [facility]   If 7PM-7AM, please contact night-coverage [email or web address]   04/05/2022, 7:04 PM

## 2022-04-05 NOTE — ED Notes (Signed)
Pt ambulatory to restroom with independent steady gait °

## 2022-04-05 NOTE — ED Triage Notes (Signed)
Patient here POV from Home.  Endorses being in a Fall 6 Days PTA and being in ED for Same. Evaluated and discharged with No Acute Complications. Patient returns today for Reevaluation due to Worsened Bruising to Right Leg.   Patient does take Eliquis. No New Injuries since Original Fall.  NAD Noted during Triage. A&Ox4. GCS 15. Ambulatory.

## 2022-04-05 NOTE — ED Notes (Signed)
Attempted to call report to receiving nurse, not on floor. Will call back

## 2022-04-05 NOTE — ED Provider Notes (Signed)
[facility] Provider Note   CSN: [id number] Arrival date & time: 04/05/22  1305     History  Chief Complaint  Patient presents with   Bleeding/Bruising    Vickie Burns is a 81 y.o. female.  Patient presents to ER chief complaint of increased swelling and bruising to the right lower extremity.  She was seen here about 6 days ago after a fall complaining of knee pain.  Work-up at that time and been negative and she was discharged home.  She continued on Eliquis due to her history of atrial fibrillation, noticed worsening swelling to the lower extremity and presents to the ER.  Otherwise denies any new pains denies any fevers or cough or vomiting or diarrhea no chest pain or shortness of breath.      Home Medications Prior to Admission medications   Medication Sig Start Date End Date Taking? Authorizing Provider  aspirin EC 81 MG tablet Take 81 mg by mouth daily.    [provider]  Calcium Carbonate-Vitamin D3 600-400 MG-UNIT TABS Take 1 tablet by mouth 2 (two) times daily.    [provider]  digoxin (LANOXIN) 0.125 MG tablet Take 1 tablet (0.125 mg total) by mouth daily. TAKE 1 TABLET(0.125 MG) BY MOUTH DAILY 02/05/22   [name], Reita Cliche, MD  ELIQUIS 5 MG TABS tablet TAKE 1 TABLET BY MOUTH  TWICE DAILY 08/13/21   [name], Reita Cliche, MD  ezetimibe (ZETIA) 10 MG tablet Take 10 mg by mouth every evening.    [provider]  gabapentin (NEURONTIN) 600 MG tablet Take 600 mg by mouth 3 (three) times daily.    [provider]  HUMALOG MIX 50/50 KWIKPEN (50-50) 100 UNIT/ML KwikPen Inject 16 Units into the skin in the morning. Then takes 10 units in at lunch and takes 14 in the evening 09/17/21   [provider]  JARDIANCE 25 MG TABS tablet Take 25 mg by mouth daily. 09/28/20   [provider]  metFORMIN (GLUCOPHAGE) 500 MG tablet Take 1,000 mg by mouth 2 (two) times daily.    [provider]   rosuvastatin (CRESTOR) 40 MG tablet Take 40 mg by mouth every evening.    [provider]  vitamin B-12 (CYANOCOBALAMIN) 1000 MCG tablet Take 1,000 mcg by mouth daily.    [provider]      Allergies    Patient has no known allergies.    Review of Systems   Review of Systems  Constitutional:  Negative for fever.  HENT:  Negative for ear pain.   Eyes:  Negative for pain.  Respiratory:  Negative for cough.   Cardiovascular:  Negative for chest pain.  Gastrointestinal:  Negative for abdominal pain.  Genitourinary:  Negative for flank pain.  Musculoskeletal:  Negative for back pain.  Skin:  Positive for color change. Negative for rash.  Neurological:  Negative for headaches.   Physical Exam Updated Vital Signs BP 126/75 (BP Location: Right Arm)   Pulse 65   Temp 98.1 F (36.7 C) (Oral)   Resp 18   Ht '5\' 10"'$  (1.778 m)   Wt 88 kg   LMP  (LMP Unknown)   SpO2 97%   BMI 27.84 kg/m  Physical Exam Constitutional:      General: She is not in acute distress.    Appearance: Normal appearance.  HENT:     Head: Normocephalic.     Nose: Nose normal.  Eyes:     Extraocular Movements:  Extraocular movements intact.  Cardiovascular:     Rate and Rhythm: Normal rate.  Pulmonary:     Effort: Pulmonary effort is normal.  Musculoskeletal:        General: Normal range of motion.     Cervical back: Normal range of motion.     Comments: Near normal range of motion of the right knee minimal pain on evaluation.  No abnormal warmth or cellulitis noted.  Extensive ecchymosis and swelling of the right lower extremity however from the proximal thigh all the way to the ankle.  Distal pulses are intact 2+ compartments otherwise soft.  Strength 5/5 all extremities.  Neurological:     General: No focal deficit present.     Mental Status: She is alert. Mental status is at baseline.    ED Results / Procedures / Treatments   Labs (all labs ordered are listed, but only abnormal  results are displayed) Labs Reviewed  CBC WITH DIFFERENTIAL/PLATELET - Abnormal; Notable for the following components:      Result Value   RBC 2.93 (*)    Hemoglobin 8.0 (*)    HCT 26.0 (*)    RDW 16.2 (*)    All other components within normal limits  BASIC METABOLIC PANEL - Abnormal; Notable for the following components:   Glucose, Bld 109 (*)    BUN 25 (*)    All other components within normal limits    EKG None  Radiology No results found.  Procedures Procedures    Medications Ordered in ED Medications - No data to display  ED Course/ Medical Decision Making/ A&P                           Medical Decision Making Amount and/or Complexity of Data Reviewed Labs: ordered.   Chart review shows history of a fall Mar 30, 2022.  Cardiac monitoring showing sinus rhythm.  Sinus studies were sent, hemoglobin found to be 8.0 with baseline hemoglobin around 12.5.  Given this finding I feel the patient will need admission, hospitalist consulted for admission and further monitoring evaluation of her right lower extremity.        Final Clinical Impression(s) / ED Diagnoses Final diagnoses:  Anemia, unspecified type  Traumatic ecchymosis of right lower leg, initial encounter    Rx / DC Orders ED Discharge Orders     None         Luna Fuse, MD 04/05/22 1458

## 2022-04-06 DIAGNOSIS — E1142 Type 2 diabetes mellitus with diabetic polyneuropathy: Secondary | ICD-10-CM | POA: Diagnosis not present

## 2022-04-06 DIAGNOSIS — D62 Acute posthemorrhagic anemia: Secondary | ICD-10-CM | POA: Diagnosis not present

## 2022-04-06 DIAGNOSIS — E7800 Pure hypercholesterolemia, unspecified: Secondary | ICD-10-CM | POA: Diagnosis not present

## 2022-04-06 DIAGNOSIS — W19XXXA Unspecified fall, initial encounter: Secondary | ICD-10-CM | POA: Diagnosis not present

## 2022-04-06 LAB — CBC
HCT: 26.6 % — ABNORMAL LOW (ref 36.0–46.0)
Hemoglobin: 8.1 g/dL — ABNORMAL LOW (ref 12.0–15.0)
MCH: 27.6 pg (ref 26.0–34.0)
MCHC: 30.5 g/dL (ref 30.0–36.0)
MCV: 90.8 fL (ref 80.0–100.0)
Platelets: 245 K/uL (ref 150–400)
RBC: 2.93 MIL/uL — ABNORMAL LOW (ref 3.87–5.11)
RDW: 16 % — ABNORMAL HIGH (ref 11.5–15.5)
WBC: 7.4 K/uL (ref 4.0–10.5)
nRBC: 0 % (ref 0.0–0.2)

## 2022-04-06 LAB — GLUCOSE, CAPILLARY
Glucose-Capillary: 199 mg/dL — ABNORMAL HIGH (ref 70–99)
Glucose-Capillary: 245 mg/dL — ABNORMAL HIGH (ref 70–99)
Glucose-Capillary: 265 mg/dL — ABNORMAL HIGH (ref 70–99)
Glucose-Capillary: 276 mg/dL — ABNORMAL HIGH (ref 70–99)

## 2022-04-06 LAB — HEMOGLOBIN A1C
Hgb A1c MFr Bld: 7 % — ABNORMAL HIGH (ref 4.8–5.6)
Mean Plasma Glucose: 154.2 mg/dL

## 2022-04-06 MED ORDER — FERROUS GLUCONATE 324 (38 FE) MG PO TABS
324.0000 mg | ORAL_TABLET | Freq: Every day | ORAL | Status: DC
  Administered 2022-04-06 – 2022-04-07 (×2): 324 mg via ORAL
  Filled 2022-04-06 (×2): qty 1

## 2022-04-06 MED ORDER — INSULIN GLARGINE-YFGN 100 UNIT/ML ~~LOC~~ SOLN
10.0000 [IU] | Freq: Every day | SUBCUTANEOUS | Status: DC
  Administered 2022-04-06 – 2022-04-07 (×2): 10 [IU] via SUBCUTANEOUS
  Filled 2022-04-06 (×2): qty 0.1

## 2022-04-06 MED ORDER — POLYVINYL ALCOHOL 1.4 % OP SOLN
1.0000 [drp] | Freq: Two times a day (BID) | OPHTHALMIC | Status: DC
  Administered 2022-04-06 – 2022-04-07 (×3): 1 [drp] via OPHTHALMIC
  Filled 2022-04-06: qty 15

## 2022-04-06 MED ORDER — KETOROLAC TROMETHAMINE 0.5 % OP SOLN
1.0000 [drp] | Freq: Two times a day (BID) | OPHTHALMIC | Status: DC
  Administered 2022-04-06 – 2022-04-07 (×3): 1 [drp] via OPHTHALMIC
  Filled 2022-04-06: qty 5

## 2022-04-06 MED ORDER — ACETAMINOPHEN 500 MG PO TABS
1000.0000 mg | ORAL_TABLET | Freq: Four times a day (QID) | ORAL | Status: DC
  Administered 2022-04-06 – 2022-04-07 (×5): 1000 mg via ORAL
  Filled 2022-04-06 (×5): qty 2

## 2022-04-06 MED ORDER — DOCUSATE SODIUM 100 MG PO CAPS
100.0000 mg | ORAL_CAPSULE | Freq: Two times a day (BID) | ORAL | Status: DC
  Administered 2022-04-06 – 2022-04-07 (×3): 100 mg via ORAL
  Filled 2022-04-06 (×3): qty 1

## 2022-04-06 NOTE — Progress Notes (Signed)
PROGRESS NOTE    Vickie Burns  FXT:[id number] DOB: November 27, 1940 DOA: 04/05/2022 PCP: Lajean Manes, MD    Brief Narrative:  Vickie Burns is a 81 y.o. female with medical history significant of atrial fibrillation on anticoagulation, insulin-dependent diabetes, she reports having a mechanical fall approximately 1 week ago.  At that time, she felt okay.  She was seen in the emergency room on 5/20 when she developed swelling in her right knee.  X-rays at that time did not show any acute traumatic bony injuries.  Hemoglobin was not checked at that time.  She returned home and reported to keep her leg elevated and was icing her leg.  She had noted that she had progressive bruising and swelling of her right lower extremity.  During this time she was still able to ambulate and bear weight on her right leg.  She was not using any cane or walker.  She noticed that she was becoming tired when she was ambulating, but denied any dizziness, lightheadedness or shortness of breath.  No chest pain.  She noticed worsening bruising on her right leg and therefore came to the emergency room for repeat evaluation.  Labs were drawn during her visit to the ED and it was noted that hemoglobin was 8.0.  Appears that her baseline hemoglobin is more around 12.  Patient does report that she continued to take her Eliquis after her fall, her last dose was earlier on the day of admission.  Patient was sent to the hospital for further evaluation   Assessment & Plan:   Principal Problem:   Acute blood loss anemia Active Problems:   Hypercholesterolemia   Diabetic peripheral neuropathy associated with type 2 diabetes mellitus (HCC)   Persistent atrial fibrillation (HCC)   Traumatic ecchymosis of right lower leg   Fall at home, initial encounter   Acute blood loss anemia in the setting of anticoagulation -Baseline hemoglobin around 12 -Admission hemoglobin noted to be 8 -Significant ecchymosis/bleeding from  trauma in her right leg -Hold anticoagulation -Hemoglobin this morning appears to be stable, will repeat tomorrow a.m. -Suspect that she will develop significant hyperbilirubinemia from hemolysis of blood products since she has had a significant amount of intramuscular bleeding. -encouraged hydration.   Permanent atrial fibrillation -Heart rate is currently stable -Continue on digoxin -Discussed stopping anticoagulation for now in light of significant bleeding.  -Explained that being off anticoagulation does increase her risk of stroke, although in current setting, continuing anticoagulation may pose a greater risk of bleeding and worsening anemia -Patient in agreement to temporarily hold anticoagulation -I suspect that she may be able to restart Eliquis in the next week if her hemoglobin stabilizes and bleeding resolves   Fall -Patient had mechanical fall approximately 1 week ago -She has since been ambulating without assistance of cane or walker   Insulin-dependent diabetes -She is on metformin, Jardiance and insulin -Holding these agents on admission -SSI with NovoLog -A1c 7.0 -Blood sugars elevated during hospital stay, will start on 10 units of glargine -continue gabapentin for peripheral neuropathy   Hyperlipidemia -Continue statin   DVT prophylaxis: SCDs Start: 04/05/22 1903  Code Status: Full code Family Communication: Discussed with patient Disposition Plan: Status is: Observation The patient remains OBS appropriate and will d/c before 2 midnights.     Consultants:    Procedures:    Antimicrobials:      Subjective: Continues to have pain in her feet.  Continues to have swelling in her right lower extremity  Objective:  Vitals:   04/05/22 1731 04/05/22 2129 04/06/22 0137 04/06/22 0535  BP: 137/65 121/87 129/66 (!) 116/54  Pulse: (!) 44 60 60 (!) 59  Resp: '18 14 14 14  '$ Temp: 97.7 F (36.5 C) 98.2 F (36.8 C) 98.2 F (36.8 C) 98.4 F (36.9 C)   TempSrc: Oral Oral Oral Oral  SpO2: 100% 99% 98% 99%  Weight:      Height: 5' 10.5" (1.791 m)      No intake or output data in the 24 hours ending 04/06/22 1500 Filed Weights   04/05/22 1321  Weight: 88 kg    Examination:  General exam: Appears calm and comfortable  Respiratory system: Clear to auscultation. Respiratory effort normal. Cardiovascular system: S1 & S2 heard, RRR. No JVD, murmurs, rubs, gallops or clicks.  Gastrointestinal system: Abdomen is nondistended, soft and nontender. No organomegaly or masses felt. Normal bowel sounds heard. Central nervous system: Alert and oriented. No focal neurological deficits. Extremities: Swelling noted in right lower extremity Skin: Overall ecchymosis noted over right lower extremity appears to be slowly improving Psychiatry: Judgement and insight appear normal. Mood & affect appropriate.     Data Reviewed: I have personally reviewed following labs and imaging studies  CBC: Recent Labs  Lab 04/05/22 1403 04/06/22 0515  WBC 6.9 7.4  NEUTROABS 5.3  --   HGB 8.0* 8.1*  HCT 26.0* 26.6*  MCV 88.7 90.8  PLT 241 315   Basic Metabolic Panel: Recent Labs  Lab 04/05/22 1403  NA 137  K 3.8  CL 109  CO2 22  GLUCOSE 109*  BUN 25*  CREATININE 0.94  CALCIUM 9.3   GFR: Estimated Creatinine Clearance: 59 mL/min (by C-G formula based on SCr of 0.94 mg/dL). Liver Function Tests: No results for input(s): AST, ALT, ALKPHOS, BILITOT, PROT, ALBUMIN in the last 168 hours. No results for input(s): LIPASE, AMYLASE in the last 168 hours. No results for input(s): AMMONIA in the last 168 hours. Coagulation Profile: No results for input(s): INR, PROTIME in the last 168 hours. Cardiac Enzymes: No results for input(s): CKTOTAL, CKMB, CKMBINDEX, TROPONINI in the last 168 hours. BNP (last 3 results) No results for input(s): PROBNP in the last 8760 hours. HbA1C: Recent Labs    04/06/22 0515  HGBA1C 7.0*   CBG: Recent Labs  Lab  04/05/22 2122 04/05/22 2248 04/06/22 0816 04/06/22 1107  GLUCAP 188* 230* 199* 245*   Lipid Profile: No results for input(s): CHOL, HDL, LDLCALC, TRIG, CHOLHDL, LDLDIRECT in the last 72 hours. Thyroid Function Tests: No results for input(s): TSH, T4TOTAL, FREET4, T3FREE, THYROIDAB in the last 72 hours. Anemia Panel: No results for input(s): VITAMINB12, FOLATE, FERRITIN, TIBC, IRON, RETICCTPCT in the last 72 hours. Sepsis Labs: No results for input(s): PROCALCITON, LATICACIDVEN in the last 168 hours.  No results found for this or any previous visit (from the past 240 hour(s)).       Radiology Studies: No results found.      Scheduled Meds:  acetaminophen  1,000 mg Oral QID   digoxin  0.125 mg Oral Daily   docusate sodium  100 mg Oral BID   ezetimibe  10 mg Oral QPM   ferrous gluconate  324 mg Oral Q breakfast   gabapentin  600 mg Oral TID   insulin aspart  0-15 Units Subcutaneous TID WC   insulin aspart  0-5 Units Subcutaneous QHS   insulin glargine-yfgn  10 Units Subcutaneous Daily   ketorolac  1 drop Left Eye BID   polyvinyl  alcohol  1 drop Both Eyes BID   rosuvastatin  40 mg Oral QPM   Continuous Infusions:   LOS: 1 day    Time spent: 31mns    JKathie Dike MD [facility]   If 7PM-7AM, please contact night-coverage [email or web address]  04/06/2022, 3:00 PM

## 2022-04-07 DIAGNOSIS — D649 Anemia, unspecified: Secondary | ICD-10-CM | POA: Diagnosis not present

## 2022-04-07 DIAGNOSIS — D62 Acute posthemorrhagic anemia: Secondary | ICD-10-CM | POA: Diagnosis not present

## 2022-04-07 DIAGNOSIS — E7800 Pure hypercholesterolemia, unspecified: Secondary | ICD-10-CM | POA: Diagnosis not present

## 2022-04-07 DIAGNOSIS — E1142 Type 2 diabetes mellitus with diabetic polyneuropathy: Secondary | ICD-10-CM | POA: Diagnosis not present

## 2022-04-07 LAB — COMPREHENSIVE METABOLIC PANEL
ALT: 10 U/L (ref 0–44)
AST: 14 U/L — ABNORMAL LOW (ref 15–41)
Albumin: 3.8 g/dL (ref 3.5–5.0)
Alkaline Phosphatase: 51 U/L (ref 38–126)
Anion gap: 11 (ref 5–15)
BUN: 30 mg/dL — ABNORMAL HIGH (ref 8–23)
CO2: 18 mmol/L — ABNORMAL LOW (ref 22–32)
Calcium: 9 mg/dL (ref 8.9–10.3)
Chloride: 105 mmol/L (ref 98–111)
Creatinine, Ser: 0.91 mg/dL (ref 0.44–1.00)
GFR, Estimated: >60 mL/min (ref >60)
Glucose, Bld: 197 mg/dL — ABNORMAL HIGH (ref 70–99)
Potassium: 3.9 mmol/L (ref 3.5–5.1)
Sodium: 134 mmol/L — ABNORMAL LOW (ref 135–145)
Total Bilirubin: 1.5 mg/dL — ABNORMAL HIGH (ref 0.3–1.2)
Total Protein: 6.3 g/dL — ABNORMAL LOW (ref 6.5–8.1)

## 2022-04-07 LAB — GLUCOSE, CAPILLARY
Glucose-Capillary: 199 mg/dL — ABNORMAL HIGH (ref 70–99)
Glucose-Capillary: 190 mg/dL — ABNORMAL HIGH (ref 70–99)
Glucose-Capillary: 280 mg/dL — ABNORMAL HIGH (ref 70–99)

## 2022-04-07 LAB — CBC
HCT: 27.7 % — ABNORMAL LOW (ref 36.0–46.0)
Hemoglobin: 8.4 g/dL — ABNORMAL LOW (ref 12.0–15.0)
MCH: 27.7 pg (ref 26.0–34.0)
MCHC: 30.3 g/dL (ref 30.0–36.0)
MCV: 91.4 fL (ref 80.0–100.0)
Platelets: 287 10*3/uL (ref 150–400)
RBC: 3.03 MIL/uL — ABNORMAL LOW (ref 3.87–5.11)
RDW: 16.2 % — ABNORMAL HIGH (ref 11.5–15.5)
WBC: 7.9 10*3/uL (ref 4.0–10.5)
nRBC: 0 % (ref 0.0–0.2)

## 2022-04-07 MED ORDER — FERROUS GLUCONATE 324 (38 FE) MG PO TABS: 324.0000 mg | ORAL_TABLET | Freq: Every day | ORAL | 3 refills | Status: DC

## 2022-04-07 MED ORDER — EMPAGLIFLOZIN 25 MG PO TABS
25.0000 mg | ORAL_TABLET | Freq: Every day | ORAL | Status: DC
  Administered 2022-04-07: 25 mg via ORAL
  Filled 2022-04-07: qty 1

## 2022-04-07 MED ORDER — DOCUSATE SODIUM 100 MG PO CAPS: 100.0000 mg | ORAL_CAPSULE | Freq: Two times a day (BID) | ORAL | 0 refills | Status: DC

## 2022-04-07 MED ORDER — METFORMIN HCL 500 MG PO TABS: 1000.0000 mg | ORAL_TABLET | Freq: Two times a day (BID) | ORAL | Status: DC

## 2022-04-07 MED ORDER — LACTATED RINGERS IV BOLUS
1000.0000 mL | Freq: Once | INTRAVENOUS | Status: AC
  Administered 2022-04-07: 1000 mL via INTRAVENOUS

## 2022-04-07 NOTE — TOC Transition Note (Signed)
Transition of Care [facility]) - CM/SW Discharge Note   Patient Details  Name: Vickie Burns MRN: [id number] Date of Birth: 06/04/1941  Transition of Care [facility]) CM/SW Contact:  Ross Ludwig, LCSW Phone Number: 04/07/2022, 3:07 PM   Clinical Narrative:     CSW was informed that patient needs assistance for transportation to get home.  CSW asked if patient has looked at [place] and she does not have anyone who is able to pick up.  CSW was able to provide a cab voucher for her to get home.  CSW also spoke to patient and explained to her about Code 44 status, and being switched to observation from inpatient.  Patient expressed understanding and did not have any other questions about it.  TOC signing off please reconsult if other TOC needs arise.   Final next level of care: Home/Self Care Barriers to Discharge: Barriers Resolved   Patient Goals and CMS Choice Patient states their goals for this hospitalization and ongoing recovery are:: To return back home.      Discharge Placement                       Discharge Plan and Services                                     Social Determinants of Health (SDOH) Interventions     Readmission Risk Interventions     View : No data to display.

## 2022-04-07 NOTE — Progress Notes (Signed)
Pt being discharged to home via [employer]. Discharge instructions and medication provided to pt.

## 2022-04-07 NOTE — Care Management CC44 (Signed)
Condition Code 44 Documentation Completed  Patient Details  Name: Vickie Burns MRN: [id number] Date of Birth: Aug 05, 1941   Condition Code 44 given:  Yes Patient signature on Condition Code 44 notice:    Documentation of 2 MD's agreement:  Yes Code 44 added to claim:  Yes    Ross Ludwig, LCSW 04/07/2022, 1:50 PM

## 2022-04-07 NOTE — Discharge Summary (Signed)
Physician Discharge Summary  Vickie Burns VOZ:[id number] DOB: March 11, 1941 DOA: 04/05/2022  PCP: Lajean Manes, MD  Admit date: 04/05/2022 Discharge date: 04/07/2022  Admitted From: Home Disposition: Home  Recommendations for Outpatient Follow-up:  Follow up with PCP in 1-2 weeks Please obtain BMP/CBC in one week Follow-up with primary care physician/cardiologist in the next 1 week for repeat labs including CBC and discussion around resumption of anticoagulation   Discharge Condition: Stable CODE STATUS: Full code Diet recommendation: Heart healthy  Brief/Interim Summary: Vickie Burns is a 81 y.o. female with medical history significant of atrial fibrillation on anticoagulation, insulin-dependent diabetes, she reports having a mechanical fall approximately 1 week ago.  At that time, she felt okay.  She was seen in the emergency room on 5/20 when she developed swelling in her right knee.  X-rays at that time did not show any acute traumatic bony injuries.  Hemoglobin was not checked at that time.  She returned home and reported to keep her leg elevated and was icing her leg.  She had noted that she had progressive bruising and swelling of her right lower extremity.  During this time she was still able to ambulate and bear weight on her right leg.  She was not using any cane or walker.  She noticed that she was becoming tired when she was ambulating, but denied any dizziness, lightheadedness or shortness of breath.  No chest pain.  She noticed worsening bruising on her right leg and therefore came to the emergency room for repeat evaluation.  Labs were drawn during her visit to the ED and it was noted that hemoglobin was 8.0.  Appears that her baseline hemoglobin is more around 12.  Patient does report that she continued to take her Eliquis after her fall, her last dose was earlier on the day of admission.  Patient was sent to the hospital for further evaluation  Discharge Diagnoses:   Principal Problem:   Acute blood loss anemia Active Problems:   Hypercholesterolemia   Diabetic peripheral neuropathy associated with type 2 diabetes mellitus (HCC)   Persistent atrial fibrillation (HCC)   Traumatic ecchymosis of right lower leg   Fall at home, initial encounter  Acute blood loss anemia in the setting of anticoagulation -Baseline hemoglobin around 12 -Admission hemoglobin noted to be 8 -Significant ecchymosis/bleeding from trauma in her right leg -Hold anticoagulation -Hemoglobin remained stable throughout her hospital stay -She did not require transfusion  Permanent atrial fibrillation -Heart rate is currently stable -Continue on digoxin -Discussed stopping anticoagulation for now in light of significant bleeding.   -Explained that being off anticoagulation does increase her risk of stroke, although in current setting, continuing anticoagulation may pose a greater risk of bleeding and worsening anemia -Patient in agreement to temporarily hold anticoagulation -I suspect that she may be able to restart Eliquis in the next week if her hemoglobin stabilizes and bleeding resolves -She will need to follow-up with her PCP or cardiologist to have repeat labs in 1 week to ensure stability of hemoglobin and discussed restarting anticoagulation   Fall -Patient had mechanical fall approximately 1 week ago -She has since been ambulating independently without assistance of cane or walker   Insulin-dependent diabetes -She is on metformin, Jardiance and insulin -Holding these agents on admission -SSI with NovoLog -A1c 7.0 -Continue home regimen on discharge -continue gabapentin for peripheral neuropathy   Hyperlipidemia -Continue statin  Discharge Instructions  Discharge Instructions     Diet - low sodium heart healthy  Complete by: As directed    Increase activity slowly   Complete by: As directed       Allergies as of 04/07/2022   No Known Allergies       Medication List     STOP taking these medications    aspirin EC 81 MG tablet   Eliquis 5 MG Tabs tablet Generic drug: apixaban       TAKE these medications    ARTIFICIAL EYE OP Place 1 drop into both eyes in the morning and at bedtime.   Calcium Carbonate-Vitamin D3 600-400 MG-UNIT Tabs Take 1 tablet by mouth 2 (two) times daily.   digoxin 0.125 MG tablet Commonly known as: LANOXIN Take 1 tablet (0.125 mg total) by mouth daily. TAKE 1 TABLET(0.125 MG) BY MOUTH DAILY   docusate sodium 100 MG capsule Commonly known as: COLACE Take 1 capsule (100 mg total) by mouth 2 (two) times daily.   ezetimibe 10 MG tablet Commonly known as: ZETIA Take 10 mg by mouth every evening.   ferrous gluconate 324 MG tablet Commonly known as: FERGON Take 1 tablet (324 mg total) by mouth daily with breakfast. Start taking on: Apr 08, 2022   gabapentin 600 MG tablet Commonly known as: NEURONTIN Take 600 mg by mouth 3 (three) times daily.   HumaLOG Mix 50/50 KwikPen (50-50) 100 UNIT/ML Kwikpen Generic drug: Insulin Lispro Prot & Lispro Inject 10-16 Units into the skin as directed. Take 16 units in the morning, Take 10 units at lunch, Take 14 units in the evening   Jardiance 25 MG Tabs tablet Generic drug: empagliflozin Take 25 mg by mouth daily.   KETOROLAC TROMETHAMINE OP Place 1 drop into the left eye in the morning and at bedtime. 0.5%   metFORMIN 500 MG tablet Commonly known as: GLUCOPHAGE Take 1,000 mg by mouth 2 (two) times daily.   rosuvastatin 40 MG tablet Commonly known as: CRESTOR Take 40 mg by mouth every evening.   vitamin B-12 1000 MCG tablet Commonly known as: CYANOCOBALAMIN Take 1,000 mcg by mouth daily.        Follow-up Information     follow up with Dr. Felipa Eth or Dr. Geraldo Pitter Follow up.   Why: follow up in 1 week for repeat blood work and to discuss restarting eliquis and aspirin               No Known  Allergies  Consultations:    Procedures/Studies: DG Wrist Complete Right  Result Date: 03/11/2022 CLINICAL DATA:  Injury opening cabinet door and plastic signs fell on right hand. Lacerations bruising and swelling. EXAM: RIGHT HAND - COMPLETE 3+ VIEW; RIGHT WRIST - COMPLETE 3+ VIEW COMPARISON:  Right wrist and hand radiographs 10/23/2020 FINDINGS: Right wrist: There is diffuse decreased bone mineralization. Severe triscaphe and thumb carpometacarpal joint space narrowing. Large peripheral thumb carpometacarpal degenerative osteophytes. There is widening of the scapholunate interval up to approximately 4 mm suggesting scapholunate ligament insufficiency. No acute fracture is seen. No dislocation. Right hand: There is again severe index finger DIP joint space narrowing. Moderate third and mild fourth and fifth DIP joint space narrowing. Mild thumb interphalangeal joint space narrowing. No acute fracture is seen. No dislocation. IMPRESSION:: IMPRESSION: 1. 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 2. No acute fracture is seen. 3. 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 Electronically Signed   By: Yvonne Kendall M.D.   On: 03/11/2022 11:07   CT Head Wo Contrast  Result  Date: 03/30/2022 CLINICAL DATA:  Head trauma. EXAM: CT HEAD WITHOUT CONTRAST TECHNIQUE: Contiguous axial images were obtained from the base of the skull through the vertex without intravenous contrast. RADIATION DOSE REDUCTION: This exam was performed according to the departmental dose-optimization program which includes automated exposure control, adjustment of the mA and/or kV according to patient size and/or use of iterative reconstruction technique. COMPARISON:  Head CT dated 10/07/2016. FINDINGS: Brain: Mild age-related atrophy and chronic microvascular ischemic changes. There is no acute intracranial hemorrhage. No mass effect or  midline shift. No extra-axial fluid collection. Vascular: No hyperdense vessel or unexpected calcification. Skull: Normal. Negative for fracture or focal lesion. Sinuses/Orbits: No acute finding. Other: None IMPRESSION: 1. No acute intracranial pathology. 2. Mild age-related atrophy and chronic microvascular ischemic changes. Electronically Signed   By: Anner Crete M.D.   On: 03/30/2022 19:16   DG Knee Complete 4 Views Right  Result Date: 03/30/2022 CLINICAL DATA:  Tripped and fall with knee pain. EXAM: RIGHT KNEE - COMPLETE 4+ VIEW COMPARISON:  None Available. FINDINGS: No evidence of fracture, dislocation, or joint effusion. Moderate tricompartmental osteoarthritis is noted. There is significant soft tissue swelling anterior to the knee. IMPRESSION: No acute osseous injury. Electronically Signed   By: Zerita Boers M.D.   On: 03/30/2022 18:46   DG Hand Complete Left  Result Date: 03/30/2022 CLINICAL DATA:  Fall. EXAM: LEFT HAND - COMPLETE 3+ VIEW COMPARISON:  None Available. FINDINGS: There is no evidence of fracture or dislocation. There are moderate degenerative changes at the first carpometacarpal joint. Soft tissues are unremarkable. IMPRESSION: No acute fracture or dislocation. Electronically Signed   By: Ronney Asters M.D.   On: 03/30/2022 18:47   DG Hand Complete Right  Result Date: 03/11/2022 CLINICAL DATA:  Injury opening cabinet door and plastic signs fell on right hand. Lacerations bruising and swelling. EXAM: RIGHT HAND - COMPLETE 3+ VIEW; RIGHT WRIST - COMPLETE 3+ VIEW COMPARISON:  Right wrist and hand radiographs 10/23/2020 FINDINGS: Right wrist: There is diffuse decreased bone mineralization. Severe triscaphe and thumb carpometacarpal joint space narrowing. Large peripheral thumb carpometacarpal degenerative osteophytes. There is widening of the scapholunate interval up to approximately 4 mm suggesting scapholunate ligament insufficiency. No acute fracture is seen. No dislocation.  Right hand: There is again severe index finger DIP joint space narrowing. Moderate third and mild fourth and fifth DIP joint space narrowing. Mild thumb interphalangeal joint space narrowing. No acute fracture is seen. No dislocation. IMPRESSION:: IMPRESSION: 1. 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 2. No acute fracture is seen. 3. 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 Electronically Signed   By: Yvonne Kendall M.D.   On: 03/11/2022 11:07      Subjective: Overall she is feeling better.  She is able to ambulate.  Swelling in her lower extremity is improving.  Discharge Exam: Vitals:   04/06/22 0535 04/06/22 1521 04/06/22 2045 04/07/22 0507  BP: (!) 116/54 (!) 134/58 129/72 (!) 115/54  Pulse: (!) 59 (!) 58 69 (!) 51  Resp: '14  16 18  '$ Temp: 98.4 F (36.9 C) 98.7 F (37.1 C) 98.6 F (37 C) 98 F (36.7 C)  TempSrc: Oral Oral Oral Oral  SpO2: 99% 98% 97% 98%  Weight:      Height:        General: Pt is alert, awake, not in acute distress Cardiovascular: RRR, S1/S2 +, no rubs, no gallops Respiratory: CTA bilaterally, no wheezing, no rhonchi Abdominal:  Soft, NT, ND, bowel sounds + Extremities: Ecchymosis in lower extremity appears to be progressing.  Swelling is better.  See attached photos.  Please refer to photos from H&P for comparison of ecchymosis on admission         The results of significant diagnostics from this hospitalization (including imaging, microbiology, ancillary and laboratory) are listed below for reference.     Microbiology: No results found for this or any previous visit (from the past 240 hour(s)).   Labs: BNP (last 3 results) No results for input(s): BNP in the last 8760 hours. Basic Metabolic Panel: Recent Labs  Lab 04/05/22 1403 04/07/22 0509  NA 137 134*  K 3.8 3.9  CL 109 105  CO2 22 18*  GLUCOSE 109* 197*  BUN 25* 30*   CREATININE 0.94 0.91  CALCIUM 9.3 9.0   Liver Function Tests: Recent Labs  Lab 04/07/22 0509  AST 14*  ALT 10  ALKPHOS 51  BILITOT 1.5*  PROT 6.3*  ALBUMIN 3.8   No results for input(s): LIPASE, AMYLASE in the last 168 hours. No results for input(s): AMMONIA in the last 168 hours. CBC: Recent Labs  Lab 04/05/22 1403 04/06/22 0515 04/07/22 0509  WBC 6.9 7.4 7.9  NEUTROABS 5.3  --   --   HGB 8.0* 8.1* 8.4*  HCT 26.0* 26.6* 27.7*  MCV 88.7 90.8 91.4  PLT 241 245 287   Cardiac Enzymes: No results for input(s): CKTOTAL, CKMB, CKMBINDEX, TROPONINI in the last 168 hours. BNP: Invalid input(s): POCBNP CBG: Recent Labs  Lab 04/06/22 1517 04/06/22 2118 04/07/22 0430 04/07/22 0728 04/07/22 1147  GLUCAP 265* 276* 199* 190* 280*   D-Dimer No results for input(s): DDIMER in the last 72 hours. Hgb A1c Recent Labs    04/06/22 0515  HGBA1C 7.0*   Lipid Profile No results for input(s): CHOL, HDL, LDLCALC, TRIG, CHOLHDL, LDLDIRECT in the last 72 hours. Thyroid function studies No results for input(s): TSH, T4TOTAL, T3FREE, THYROIDAB in the last 72 hours.  Invalid input(s): FREET3 Anemia work up No results for input(s): VITAMINB12, FOLATE, FERRITIN, TIBC, IRON, RETICCTPCT in the last 72 hours. Urinalysis    Component Value Date/Time   COLORURINE YELLOW 05/09/2009 1115   APPEARANCEUR CLOUDY (A) 05/09/2009 1115   LABSPEC 1.007 05/09/2009 1115   PHURINE 6.0 05/09/2009 1115   GLUCOSEU NEGATIVE 05/09/2009 1115   HGBUR NEGATIVE 05/09/2009 1115   BILIRUBINUR NEGATIVE 05/09/2009 1115   KETONESUR NEGATIVE 05/09/2009 1115   PROTEINUR NEGATIVE 05/09/2009 1115   UROBILINOGEN 0.2 05/09/2009 1115   NITRITE NEGATIVE 05/09/2009 1115   LEUKOCYTESUR  05/09/2009 1115    NEGATIVE MICROSCOPIC NOT DONE ON URINES WITH NEGATIVE PROTEIN, BLOOD, LEUKOCYTES, NITRITE, OR GLUCOSE <1000 mg/dL.   Sepsis Labs Invalid input(s): PROCALCITONIN,  WBC,  LACTICIDVEN Microbiology No results  found for this or any previous visit (from the past 240 hour(s)).   Time coordinating discharge: 51mns  SIGNED:   JKathie Dike MD  [facility] 04/07/2022, 3:28 PM   If 7PM-7AM, please contact night-coverage [email or web address]

## 2022-04-07 NOTE — Care Management Obs Status (Signed)
[facility] NOTIFICATION   Patient Details  Name: Salimatou Simone MRN: [id number] Date of Birth: 05-Feb-1941   Medicare Observation Status Notification Given:  Yes    Ross Ludwig, LCSW 04/07/2022, 1:50 PM

## 2022-04-09 ENCOUNTER — Telehealth: Payer: Self-pay | Admitting: Cardiology

## 2022-04-09 ENCOUNTER — Ambulatory Visit: Payer: Medicare Other | Admitting: Orthopedic Surgery

## 2022-04-09 NOTE — Telephone Encounter (Signed)
Pt was told to follow up with [name] in 1 week due to being off of Eliquis after a fall and hgb 8.1. Appointment made.

## 2022-04-09 NOTE — Telephone Encounter (Signed)
Patient was hospitalized at [facility] for 3 days after a fall. She was told to follow up with Dr. Geraldo Pitter in one week because she was taken off he Eliquis. She is not sure what  to do. Please advise

## 2022-04-10 NOTE — Progress Notes (Unsigned)
Cardiology Office Note:    Date:  04/11/2022   ID:  Vickie Burns, Vickie Burns 08/05/1941, MRN [id number]  PCP:  Lajean Manes, MD  Cardiologist:  Shirlee More, MD    Referring MD: Lajean Manes, MD    ASSESSMENT:    1. Chronic anticoagulation   2. Persistent atrial fibrillation ([place])   3. High risk medication use   4. Anemia, blood loss    PLAN:    In order of problems listed above:  Anticoagulant is on hold she was taking both aspirin and anticoagulant before and I would not resume aspirin in the future and I would not resume an anticoagulant until her hemoglobin is improved greater than 9-10.  Told her this is very uncommon I have never quite seen anyone dropped her hemoglobin to this degree from bruising without overt bleeding.  She will continue iron but I will reduce it to every other day to avoid blockade of iron absorption Stable continue digoxin we will check a level Monday hold anticoagulation   Next appointment: 1 month   Medication Adjustments/Labs and Tests Ordered: Current medicines are reviewed at length with the patient today.  Concerns regarding medicines are outlined above.  No orders of the defined types were placed in this encounter.  No orders of the defined types were placed in this encounter.   Chief Complaint  Patient presents with   Follow-up   Anticoagulation    History of Present Illness:    Vickie Burns is a 81 y.o. female with a hx of persistent atrial fibrillation hyperlipidemia and diabetes last seen 12/20/2021.   Compliance with diet, lifestyle and medications: Yes  Recently admitted to [facility] 04/05/2022 after several falls and blood loss anemia with a hemoglobin of 8 from a baseline of 12.  Her anticoagulant was held and her hemoglobin remained stable throughout the hospital stay and did not require transfusion.  Component Ref Range & Units 4 d ago (04/07/22) 5 d ago (04/06/22) 6 d ago (04/05/22) 6 mo  ago (10/12/21) 1 yr ago (02/15/21) 1 yr ago (09/19/20) 1 yr ago (06/28/20)  WBC 4.0 - 10.5 K/uL 7.9  7.4  6.9  7.7 R  7.8  9.4 R  8.5   RBC 3.87 - 5.11 MIL/uL 3.03 Low   2.93 Low   2.93 Low   4.48 R  4.67  4.45 R  4.77   Hemoglobin 12.0 - 15.0 g/dL 8.4 Low   8.1 Low   8.0 Low   12.7 R  12.6  12.5 R  13.2              Prior to admission she was taking aspirin and anticoagulant She has not had GI or GU bleeding She feels still feels sore but is having no pain despite extensive bruising in her thigh knee and right calf She is taking iron every day a laxative twice daily I told her that iron absorption is better every other day and I think she could reduce her laxative to every other day No cardiovascular symptoms of edema shortness of breath chest pain palpitation or syncope Georgina Peer have her return on Monday we will check a CBC and digoxin level Past Medical History:  Diagnosis Date   Acute blood loss anemia 04/05/2022   Atrial fibrillation ([place]) 02/12/2021   Breast CA ([place]) 04/2009   right - radiation and lumpectomy   Coronary arteriosclerosis 09/25/2021   Diabetes mellitus due to underlying condition with unspecified complications ([place]) 7/32/2025  Diabetes mellitus, type 2 ([place]) 01/27/2012   Diabetic peripheral neuropathy associated with type 2 diabetes mellitus ([place]) 02/12/2021   Diabetic retinopathy associated with type 2 diabetes mellitus ([place]) 02/12/2021   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   Essential hypertension 10/07/2016   Fall at home, initial encounter 04/05/2022   Family history of breast cancer    aunt   History of adenomatous polyp of colon 02/12/2021   HLD (hyperlipidemia) 08/17/2012   Hypercholesteremia 2000   Hypercholesterolemia 2000   Hypertension    history   Long term (current) use of anticoagulants 12/14/2021   Long term (current) use of insulin ([place]) 02/12/2021   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   New onset a-fib ([place]) 06/28/2020   Osteoporosis 09/06/2016    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   Persistent atrial fibrillation ([place]) 06/07/2021   Personal history of malignant neoplasm of breast 02/12/2021   Personal history of radiation therapy    Postmenopausal    took HRT 1999 - 2004   Preop cardiovascular exam 07/03/2020   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   Traumatic ecchymosis of right lower leg 04/05/2022   Unspecified atrial fibrillation ([place]) 07/03/2020    Past Surgical History:  Procedure Laterality Date   BREAST BIOPSY     BREAST LUMPECTOMY  04/2009   rt breast  estrogen +, Her 2 Nu negative   BREAST LUMPECTOMY WITH RADIOACTIVE SEED LOCALIZATION Left 09/07/2020   Procedure: RADIOCATIVE SEED GUIDED LEFT BREAST LUMPECTOMY;  Surgeon: Alphonsa Overall, MD;  Location: [facility];  Service: General;  Laterality: Left;   COLONOSCOPY WITH PROPOFOL N/A 04/05/2014   Procedure: COLONOSCOPY WITH PROPOFOL;  Surgeon: Garlan Fair, MD;  [facility];  Service: Endoscopy;  Laterality: N/A;   ORIF FOOT FRACTURE Right 04/20/2014   done in [place] EXTRACTION      Current Medications: Current Meds  Medication Sig   Calcium Carbonate-Vitamin D3 600-400 MG-UNIT TABS Take 1 tablet by mouth 2 (two) times daily.   digoxin (LANOXIN) 0.125 MG tablet Take 1 tablet (0.125 mg total) by mouth daily. TAKE 1 TABLET(0.125 MG) BY MOUTH DAILY   docusate sodium (COLACE) 100 MG capsule Take 1 capsule (100 mg total) by mouth 2 (two) times daily.   ezetimibe (ZETIA) 10 MG tablet Take 10 mg by mouth every evening.   ferrous gluconate (FERGON) 324 MG tablet Take 1 tablet (324 mg total) by mouth daily with breakfast.   gabapentin (NEURONTIN) 600 MG tablet Take 600 mg by mouth 3 (three) times daily.   HUMALOG MIX 50/50 KWIKPEN (50-50) 100 UNIT/ML KwikPen Inject 10-16 Units into the skin as directed. Take 16 units in the morning, Take 10 units at lunch, Take 14 units in the evening   JARDIANCE 25 MG TABS tablet Take 25 mg by mouth daily.    KETOROLAC TROMETHAMINE OP Place 1 drop into the left eye in the morning and at bedtime. 0.5%   metFORMIN (GLUCOPHAGE) 500 MG tablet Take 1,000 mg by mouth 2 (two) times daily.   Multiple Vitamins-Minerals (PRESERVISION AREDS 2 PO) Take 1 tablet by mouth in the morning and at bedtime.   rosuvastatin (CRESTOR) 40 MG tablet Take 40 mg by mouth every evening.   vitamin B-12 (CYANOCOBALAMIN) 1000 MCG tablet Take 1,000 mcg by mouth daily.   White Petrolatum-Mineral Oil (ARTIFICIAL EYE OP) Place 1 drop into both eyes in the morning and at bedtime.     Allergies:   Patient has no known allergies.  Social History   Socioeconomic History   Marital status: Widowed    Spouse name: Not on file   Number of children: Not on file   Years of education: Not on file   Highest education level: Not on file  Occupational History   Not on file  Tobacco Use   Smoking status: Former    Packs/day: 0.25    Years: 30.00    Pack years: 7.50    Types: Cigarettes    Start date: 03/30/1962    Quit date: 11/12/1991    Years since quitting: 30.4   Smokeless tobacco: Never  Vaping Use   Vaping Use: Never used  Substance and Sexual Activity   Alcohol use: No    Alcohol/week: 0.0 standard drinks   Drug use: No   Sexual activity: Not Currently    Birth control/protection: Post-menopausal  Other Topics Concern   Not on file  Social History Narrative   2 sons, one adopted   Social Determinants of Health   Financial Resource Strain: Not on file  Food Insecurity: Not on file  Transportation Needs: Not on file  Physical Activity: Not on file  Stress: Not on file  Social Connections: Not on file     Family History: The patient's family history includes Diabetes in her father; Heart disease in her father and mother; Heart failure in her father and mother; Obesity in her brother. ROS:   Please see the history of present illness.    All other systems reviewed and are negative.  EKGs/Labs/Other Studies  Reviewed:    The following studies were reviewed today:   Recent Labs: 06/07/2021: TSH 1.650 04/07/2022: ALT 10; BUN 30; Creatinine, Ser 0.91; Hemoglobin 8.4; Platelets 287; Potassium 3.9; Sodium 134  Recent Lipid Panel    Component Value Date/Time   CHOL 115 06/14/2021 0821   TRIG 111 06/14/2021 0821   HDL 47 06/14/2021 0821   CHOLHDL 2.4 06/14/2021 0821   LDLCALC 48 06/14/2021 0821    Physical Exam:    VS:  BP 114/64   Pulse 94   Ht 5' 10.6" (1.793 m)   Wt 185 lb 1.3 oz (84 kg)   LMP  (LMP Unknown)   SpO2 99%   BMI 26.11 kg/m     Wt Readings from Last 3 Encounters:  04/11/22 185 lb 1.3 oz (84 kg)  04/05/22 194 lb 0.1 oz (88 kg)  03/30/22 194 lb (88 kg)     GEN:  Well nourished, well developed in no acute distress she has an ace wrap on her right knee that is swollen extensive bruising in the right thigh but no discrete hematoma and bruising in the upper right calf HEENT: Normal NECK: No JVD; No carotid bruits LYMPHATICS: No lymphadenopathy CARDIAC: Irregular rate and rhythm no murmurs, rubs, gallops RESPIRATORY:  Clear to auscultation without rales, wheezing or rhonchi  ABDOMEN: Soft, non-tender, non-distended MUSCULOSKELETAL:  No edema; No deformity  SKIN: Warm and dry NEUROLOGIC:  Alert and oriented x 3 PSYCHIATRIC:  Normal affect    Signed, Shirlee More, MD  04/11/2022 4:00 PM    [facility]

## 2022-04-11 ENCOUNTER — Ambulatory Visit (INDEPENDENT_AMBULATORY_CARE_PROVIDER_SITE_OTHER): Payer: Medicare Other | Admitting: Cardiology

## 2022-04-11 VITALS — BP 114/64 | HR 94 | Ht 70.6 in | Wt 185.1 lb

## 2022-04-11 DIAGNOSIS — K5730 Diverticulosis of large intestine without perforation or abscess without bleeding: Secondary | ICD-10-CM

## 2022-04-11 DIAGNOSIS — I4819 Other persistent atrial fibrillation: Secondary | ICD-10-CM | POA: Diagnosis not present

## 2022-04-11 DIAGNOSIS — Z7901 Long term (current) use of anticoagulants: Secondary | ICD-10-CM | POA: Diagnosis not present

## 2022-04-11 DIAGNOSIS — D500 Iron deficiency anemia secondary to blood loss (chronic): Secondary | ICD-10-CM

## 2022-04-11 DIAGNOSIS — Z79899 Other long term (current) drug therapy: Secondary | ICD-10-CM | POA: Diagnosis not present

## 2022-04-11 HISTORY — DX: Diverticulosis of large intestine without perforation or abscess without bleeding: K57.30

## 2022-04-11 MED ORDER — FERROUS GLUCONATE 324 (38 FE) MG PO TABS: 324.0000 mg | ORAL_TABLET | ORAL | 2 refills | Status: DC

## 2022-04-11 MED ORDER — DOCUSATE SODIUM 100 MG PO CAPS: 100.0000 mg | ORAL_CAPSULE | ORAL | 2 refills | Status: DC

## 2022-04-11 NOTE — Addendum Note (Signed)
Addended by: Edwyna Shell I on: 04/11/2022 04:34 PM   Modules accepted: Orders

## 2022-04-11 NOTE — Patient Instructions (Signed)
Medication Instructions:  Your physician has recommended you make the following change in your medication:   START: Colace 100 mg every other day START: Fergon 324 mg every other day  *If you need a refill on your cardiac medications before your next appointment, please call your pharmacy*   Lab Work: Your physician recommends that you return for lab work in:   Labs on Monday in [place]: CBC, Digoxin level  If you have labs (blood work) drawn today and your tests are completely normal, you will receive your results only by: [employer] (if you have MyChart) OR A paper copy in the mail If you have any lab test that is abnormal or we need to change your treatment, we will call you to review the results.   Testing/Procedures: None   Follow-Up: At [facility], you and your health needs are our priority.  As part of our continuing mission to provide you with exceptional heart care, we have created designated Provider Care Teams.  These Care Teams include your primary Cardiologist (physician) and Advanced Practice Providers (APPs -  Physician Assistants and Nurse Practitioners) who all work together to provide you with the care you need, when you need it.  We recommend signing up for the patient portal called "MyChart".  Sign up information is provided on this After Visit Summary.  MyChart is used to connect with patients for Virtual Visits (Telemedicine).  Patients are able to view lab/test results, encounter notes, upcoming appointments, etc.  Non-urgent messages can be sent to your provider as well.   To learn more about what you can do with MyChart, go to [email or web address].    Your next appointment:   1 month(s)  The format for your next appointment:   In Person  Provider:   Shirlee More, MD    Other Instructions None  Important Information About Sugar

## 2022-04-17 LAB — CBC
Hematocrit: 33.5 % — ABNORMAL LOW (ref 34.0–46.6)
Hemoglobin: 10 g/dL — ABNORMAL LOW (ref 11.1–15.9)
MCH: 27.5 pg (ref 26.6–33.0)
MCHC: 29.9 g/dL — ABNORMAL LOW (ref 31.5–35.7)
MCV: 92 fL (ref 79–97)
Platelets: 274 10*3/uL (ref 150–450)
RBC: 3.64 x10E6/uL — ABNORMAL LOW (ref 3.77–5.28)
RDW: 16.9 % — ABNORMAL HIGH (ref 11.7–15.4)
WBC: 7.1 10*3/uL (ref 3.4–10.8)

## 2022-04-17 LAB — DIGOXIN LEVEL: Digoxin, Serum: 0.8 ng/mL (ref 0.5–0.9)

## 2022-04-18 ENCOUNTER — Other Ambulatory Visit: Payer: Self-pay

## 2022-04-18 ENCOUNTER — Telehealth: Payer: Self-pay | Admitting: Cardiology

## 2022-04-18 NOTE — Telephone Encounter (Signed)
Patient is calling requesting a callback to discuss her lab results. She states she is going out of town tomorrow evening and would like a callback before then if possible.

## 2022-04-19 ENCOUNTER — Other Ambulatory Visit: Payer: Self-pay

## 2022-04-19 DIAGNOSIS — I4891 Unspecified atrial fibrillation: Secondary | ICD-10-CM

## 2022-04-19 MED ORDER — APIXABAN 5 MG PO TABS: 5.0000 mg | ORAL_TABLET | Freq: Two times a day (BID) | ORAL | 3 refills | Status: DC

## 2022-04-19 NOTE — Telephone Encounter (Signed)
Patient informed of results and medication and lab work ordered via [employer].

## 2022-05-08 ENCOUNTER — Telehealth: Payer: Self-pay | Admitting: Cardiology

## 2022-05-08 NOTE — Telephone Encounter (Signed)
Appointment has been made for 07/11/22 [place] she ok to wait until then?

## 2022-05-08 NOTE — Telephone Encounter (Signed)
   Pt is requesting to speak with RN Reuben Likes, she said its regarding her recent hospital visit

## 2022-05-22 ENCOUNTER — Ambulatory Visit (INDEPENDENT_AMBULATORY_CARE_PROVIDER_SITE_OTHER): Payer: Medicare Other | Admitting: Orthopedic Surgery

## 2022-05-22 ENCOUNTER — Encounter: Payer: Self-pay | Admitting: Orthopedic Surgery

## 2022-05-22 DIAGNOSIS — M1711 Unilateral primary osteoarthritis, right knee: Secondary | ICD-10-CM | POA: Diagnosis not present

## 2022-05-22 NOTE — Progress Notes (Signed)
Office Visit Note   Patient: Vickie Burns           Date of Birth: December 17, 1940           MRN: [id number] Visit Date: 05/22/2022 Requested by: Lajean Manes, MD [address] [place],  [place] [address] PCP: Lajean Manes, MD  Subjective: Chief Complaint  Patient presents with   Right Knee - Pain    HPI: Vickie Burns is a 81 y.o. female who presents to the office complaining of right knee discomfort previously.  Patient sustained a fall on 03/30/2022 directly onto her right knee.  She had radiographs at that time that demonstrated no fracture but she did have moderate arthritis on the x-rays.  At the time it sounds like she had hematoma formation in the anterior knee.  She had to be hospitalized for several days due to low hemoglobin.  She has a history of blood thinner use and takes Eliquis for A-fib.  No history of DVT/PE history.  The right knee has continued to improve to the point where she is pretty much asymptomatic.  No right knee pain, mechanical symptoms, instability.  No previous knee pain prior to the fall.  Does not keep her from doing anything she wants to do.  She also describes left foot and ankle swelling.  She has swelling in both feet and ankles and has been told to wear compression stockings but does not want to.  No recent injury to her left foot.  She does have a history of surgery to the right foot.  She is a widow and her kids live in [place].  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  She is looking to establish with orthopedic surgeon in [place].              ROS: All systems reviewed are negative as they relate to the chief complaint within the history of present illness.  Patient denies fevers or chills.  Assessment & Plan: Visit Diagnoses:  1. Primary osteoarthritis of right knee     Plan: Patient is a 81 year old female who presents for reevaluation of right knee pain following injury.  She sustained a fall on 03/30/2022  and was told to follow-up with orthopedics after development of what sounds like a hematoma in the anterior aspect of the right knee.  This is completely resolved in the interim.  Between her injury date and today's visit.  She is asymptomatic regarding her right knee.  She mostly came in today to establish follow-up with an orthopedic provider so that she has options in the future if she ever has an orthopedic problem again.  Plan for patient to follow-up with the office as needed.  Reviewed radiographs from the time of injury with her today.  Follow-Up Instructions: No follow-ups on file.   Orders:  No orders of the defined types were placed in this encounter.  No orders of the defined types were placed in this encounter.     Procedures: No procedures performed   Clinical Data: No additional findings.  Objective: Vital Signs: LMP  (LMP Unknown)   Physical Exam:  Constitutional: Patient appears well-developed HEENT:  Head: Normocephalic Eyes:EOM are normal Neck: Normal range of motion Cardiovascular: Normal rate Pulmonary/chest: Effort normal Neurologic: Patient is alert Skin: Skin is warm Psychiatric: Patient has normal mood and affect  Ortho Exam: Ortho exam demonstrates right knee with small bruise noted in the anterior aspect of the knee.  No effusion noted.  Excellent range of motion from 0 degrees extension to 120 degrees knee flexion.  No significant tenderness over the medial or lateral joint lines.  No calf tenderness.  Negative Homans' sign.  Small amount of bilateral edema noted in both calves.  Intact ankle dorsiflexion, plantarflexion, inversion, eversion of left ankle.  Able to perform straight leg raise with right knee.  No tenderness over the medial malleolus, lateral malleolus, Lisfranc complex, Achilles tendon, Achilles tendon insertion, fifth metatarsal base, ankle joint line.    Specialty Comments:  No specialty comments available.  Imaging: No results  found.   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   Coronary arteriosclerosis 09/25/2021   Persistent atrial fibrillation ([place]) 06/07/2021   Diabetic peripheral neuropathy associated with type 2 diabetes mellitus ([place]) 02/12/2021   Diabetic retinopathy associated with type 2 diabetes mellitus ([place]) 02/12/2021   History of adenomatous polyp of colon 02/12/2021   Long term (current) use of insulin ([place]) 02/12/2021   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   Personal history of malignant neoplasm of breast 02/12/2021   Atrial fibrillation ([place]) 02/12/2021   Family history of breast cancer    Hypertension    Personal history of radiation therapy    Postmenopausal    Preop cardiovascular exam 07/03/2020   Diabetes mellitus due to underlying condition with unspecified complications ([place]) 66/04/3015   Unspecified atrial fibrillation ([place]) 07/03/2020   New onset a-fib ([place]) 06/28/2020   Essential hypertension 10/07/2016   Osteoporosis 09/06/2016   Macular degeneration 2016   HLD (hyperlipidemia) 08/17/2012   Diabetes mellitus, type 2 ([place]) 01/27/2012   Breast CA ([place]) 12/20/2011   Hypercholesterolemia 2000   Hypercholesteremia 2000   Past Medical History:  Diagnosis Date   Acute blood loss anemia 04/05/2022   Atrial fibrillation ([place]) 02/12/2021   Breast CA ([place]) 04/2009   right - radiation and lumpectomy   Coronary arteriosclerosis 09/25/2021   Diabetes mellitus due to underlying condition with unspecified complications ([place]) 0/08/9322   Diabetes mellitus, type 2 ([place]) 01/27/2012   Diabetic peripheral neuropathy associated with type 2 diabetes mellitus ([place]) 02/12/2021   Diabetic retinopathy  associated with type 2 diabetes mellitus ([place]) 02/12/2021   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   Essential hypertension 10/07/2016   Fall at home, initial encounter 04/05/2022   Family history of breast cancer    aunt   History of adenomatous polyp of colon 02/12/2021   HLD (hyperlipidemia) 08/17/2012   Hypercholesteremia 2000   Hypercholesterolemia 2000   Hypertension    history   Long term (current) use of anticoagulants 12/14/2021   Long term (current) use of insulin ([place]) 02/12/2021   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   New onset a-fib ([place]) 06/28/2020   Osteoporosis 09/06/2016   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   Persistent atrial fibrillation ([place]) 06/07/2021   Personal history of malignant neoplasm of breast 02/12/2021   Personal history of radiation therapy    Postmenopausal    took HRT 1999 - 2004   Preop cardiovascular exam 07/03/2020   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   Traumatic ecchymosis of right lower leg 04/05/2022   Unspecified atrial fibrillation ([place]) 07/03/2020    Family History  Problem Relation  Age of Onset   Heart disease Mother        Confestive Heart Failure   Heart failure Mother    Diabetes Father    Heart disease Father        Congestive Heart Failure   Heart failure Father    Obesity Brother     Past Surgical History:  Procedure Laterality Date   BREAST BIOPSY     BREAST LUMPECTOMY  04/2009   rt breast  estrogen +, Her 2 Nu negative   BREAST LUMPECTOMY WITH RADIOACTIVE SEED LOCALIZATION Left 09/07/2020   Procedure: RADIOCATIVE SEED GUIDED LEFT BREAST LUMPECTOMY;  Surgeon: Alphonsa Overall, MD;  Location: [facility];  Service: General;  Laterality: Left;   COLONOSCOPY WITH PROPOFOL N/A 04/05/2014   Procedure: COLONOSCOPY WITH PROPOFOL;  Surgeon: Garlan Fair, MD;  [facility];  Service: Endoscopy;  Laterality: N/A;   ORIF FOOT FRACTURE Right 04/20/2014   done in [place]  History   Occupational History   Not on file  Tobacco Use   Smoking status: Former    Packs/day: 0.25    Years: 30.00    Total pack years: 7.50    Types: Cigarettes    Start date: 03/30/1962    Quit date: 11/12/1991    Years since quitting: 30.5   Smokeless tobacco: Never  Vaping Use   Vaping Use: Never used  Substance and Sexual Activity   Alcohol use: No    Alcohol/week: 0.0 standard drinks of alcohol   Drug use: No   Sexual activity: Not Currently    Birth control/protection: Post-menopausal

## 2022-05-24 ENCOUNTER — Ambulatory Visit: Payer: Medicare Other | Admitting: Cardiology

## 2022-05-25 ENCOUNTER — Encounter: Payer: Self-pay | Admitting: Orthopedic Surgery

## 2022-06-05 ENCOUNTER — Ambulatory Visit: Payer: Medicare Other | Admitting: Cardiology

## 2022-06-09 ENCOUNTER — Other Ambulatory Visit: Payer: Self-pay | Admitting: Cardiology

## 2022-06-18 ENCOUNTER — Ambulatory Visit: Payer: Medicare Other | Admitting: Family Medicine

## 2022-06-24 ENCOUNTER — Telehealth: Payer: Self-pay | Admitting: Cardiology

## 2022-06-24 NOTE — Telephone Encounter (Signed)
Patient is having oral surgery and wants to know if she can come off of her eliquis.  They are putting an implant in.  They need a letter from Dr. Geraldo Pitter letting them know how many days she needs to come off her Eliquis prior to her procedure.

## 2022-06-24 NOTE — Telephone Encounter (Signed)
Patient notified to have her dentist sent a procedure clearance with specifics to ensure all needs are meet for her procedure. Fax number provided.

## 2022-06-25 ENCOUNTER — Ambulatory Visit: Payer: Medicare Other | Admitting: Family Medicine

## 2022-07-05 ENCOUNTER — Encounter: Payer: Self-pay | Admitting: Cardiology

## 2022-07-05 NOTE — Telephone Encounter (Signed)
Error

## 2022-07-05 NOTE — Telephone Encounter (Signed)
Pt calling asking if we received a clearance form from her dentist office. She states the dentist office is closed today.

## 2022-07-08 NOTE — Telephone Encounter (Signed)
Notes sent to dentist clearing for simple extraction with recommendation to not hold anticoagulation. Pt has appt with Dr. Geraldo Pitter this week. As there will be a need to clear for dental implant in the future, please add surgical clearance to notes for upcoming appointment and route note to Dr. Geraldo Pitter and his nurse or CMA. Note removed from [place], [place]    07/08/2022 12:52 PM

## 2022-07-08 NOTE — Telephone Encounter (Signed)
Our office received the clearance request however; I did need to call their office as the clearance notes were hard to read. In my conversation with the DDS office it is found that the pt will be having an extraction and then after healing time she will then have the dental implant in the near future. I did explain that I will put this clearance request in only for the 1 tooth to be extracted at this time. Once their office is ready to proceed with implant they will need to fax over a clearance request at that time for that procedure.      Pre-operative Risk Assessment    Patient Name: Vickie Burns  DOB: [birth year] MRN: [id number]     Request for Surgical Clearance    Procedure:  Dental Extraction - Amount of Teeth to be Pulled:  1 TOOTH TO BE EXTRACTED AT THIS TIME IN PREPARATION FOR A DENTAL IMPLANT; SOMETIME IN THE NEAR FUTURE THE PT WILL BE HAVING A DENTAL IMPLANT . NEW CLEARANCE REQUEST WILL BE FAXED TO OUR OFFICE WHEN READY FOR THE IMPLANT PROCEDURE.   Date of Surgery:  Clearance TBD                                 Surgeon:  DR. Elveria Rising, DDS Surgeon's Group or Practice Name:  Elveria Rising, DDS Phone number:  [phone] Fax number:  [phone]   Type of Clearance Requested:   - Medical  - Pharmacy:  Hold Apixaban (Eliquis)     Type of Anesthesia:  Local    Additional requests/questions:    Vickie Burns   07/08/2022, 12:26 PM

## 2022-07-08 NOTE — Telephone Encounter (Signed)
I s/w the pt and she is going to have the DDS office fax the clearance to me [phone] attn: pre op team. Once I the request I will get into Epic for the pt's appt with Dr. Geraldo Pitter 07/11/22. Pt thanked me for the help.

## 2022-07-08 NOTE — Telephone Encounter (Signed)
   Patient Name:  Vickie Burns  DOB:  07-21-1941  MRN:  [id number]   Primary Cardiologist: None  Chart reviewed as part of pre-operative protocol coverage.   Simple dental extractions are considered low risk procedures per guidelines and generally do not require any specific cardiac clearance. It is also generally accepted that for simple extractions and dental cleanings, there is no need to interrupt blood thinner therapy.  SBE prophylaxis is not required for the patient from a cardiac standpoint.  Please call with questions.  Richardson Dopp, PA-C 07/08/2022, 12:43 PM

## 2022-07-10 ENCOUNTER — Telehealth: Payer: Self-pay | Admitting: *Deleted

## 2022-07-10 ENCOUNTER — Other Ambulatory Visit: Payer: Self-pay

## 2022-07-10 NOTE — Telephone Encounter (Signed)
   Pre-operative Risk Assessment    Patient Name: Vickie Burns  DOB: 1940/11/29 MRN: [id number]     Request for Surgical Clearance    Procedure:  Dental Extraction - Amount of Teeth to be Pulled:  1 WITH IMMEDIATE IMPLANT PLACEMENT AND BONE GRAFT IN THE SOCKET ALL IN SAME APPOINTMENT  Date of Surgery:  Clearance TBD                                 Surgeon:  Elveria Rising, DDS Surgeon's Group or Practice Name:  [facility] Phone number:  [phone] Fax number:  [phone]   Type of Clearance Requested:   - Pharmacy:  Hold Apixaban (Eliquis) NOT INDICATED   Type of Anesthesia:  Local    Additional requests/questions:    Astrid Divine   07/10/2022, 10:10 AM

## 2022-07-10 NOTE — Telephone Encounter (Signed)
Simple extractions do not require holding anticoagulation. But with bone graft, etc and prior hx of bleeding issues, may be best to allow holding of anticoagulation.  Will route to PharmD for rec's re: holding anticoagulation. Richardson Dopp, PA-C    07/10/2022 5:23 PM

## 2022-07-11 ENCOUNTER — Ambulatory Visit: Payer: Medicare Other | Attending: Cardiology | Admitting: Cardiology

## 2022-07-11 ENCOUNTER — Encounter: Payer: Self-pay | Admitting: Cardiology

## 2022-07-11 VITALS — BP 120/68 | HR 83 | Ht 70.0 in | Wt 188.0 lb

## 2022-07-11 DIAGNOSIS — I4819 Other persistent atrial fibrillation: Secondary | ICD-10-CM

## 2022-07-11 DIAGNOSIS — I4891 Unspecified atrial fibrillation: Secondary | ICD-10-CM

## 2022-07-11 DIAGNOSIS — I10 Essential (primary) hypertension: Secondary | ICD-10-CM

## 2022-07-11 DIAGNOSIS — E088 Diabetes mellitus due to underlying condition with unspecified complications: Secondary | ICD-10-CM

## 2022-07-11 DIAGNOSIS — I2510 Atherosclerotic heart disease of native coronary artery without angina pectoris: Secondary | ICD-10-CM | POA: Diagnosis not present

## 2022-07-11 NOTE — Progress Notes (Signed)
Cardiology Office Note:    Date:  07/11/2022   ID:  Vickie Burns 16-Sep-1941, MRN [id number]  PCP:  Vickie Manes, MD  Cardiologist:  Vickie Lindau, MD   Referring MD: Vickie Manes, MD    ASSESSMENT:    1. Atrial fibrillation, unspecified type ([place])   2. Essential hypertension   3. Persistent atrial fibrillation ([place])   4. Coronary arteriosclerosis   5. Diabetes mellitus due to underlying condition with unspecified complications ([place])    PLAN:    In order of problems listed above:  Coronary atherosclerosis: Secondary prevention stressed with the patient.  Importance of compliance with diet medication stressed and she vocalized understanding.  She was advised to walk at least half an hour a day 5 days a week and she promises to do so. Preoperative for dental implant: She appears to be stable clinically from cardiovascular standpoint.  I discussed anticoagulation issues with her.  Her anticoagulation can be withheld for a couple of days as deemed appropriate by the dentist.  I discussed bridging with Lovenox and she is not keen on it.  Benefits and potential risks explained and she vocalized understanding and questions were answered to her satisfaction.  She needs to be initiated on anticoagulation as soon as possible after the procedure as felt appropriate by her dentist. Essential hypertension: Blood pressure stable and diet was emphasized. Mixed dyslipidemia and diabetes mellitus: Lifestyle modification discussed at length and she tells me that she is doing her best to comply. Patient will be seen in follow-up appointment in 6 months or earlier if the patient has any concerns    Medication Adjustments/Labs and Tests Ordered: Current medicines are reviewed at length with the patient today.  Concerns regarding medicines are outlined above.  Orders Placed This Encounter  Procedures   EKG 12-Lead   No orders of the defined types were placed in this  encounter.    No chief complaint on file.    History of Present Illness:    Vickie Burns is a 81 y.o. female.  Patient has past medical history of persistent atrial fibrillation, coronary artery disease, essential hypertension, mixed dyslipidemia and diabetes mellitus.  She has had a fall in the past.  Her hemoglobin was low but then steady.  My partner evaluated her and I reviewed those findings.  Patient currently is planning to undergo implantation of her tooth that she has lost.  At the time of my evaluation, the patient is alert awake oriented and in no distress.  She mentions to me that she walks a third of a mile with normal pace without any symptoms.  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   Essential hypertension 10/07/2016   Fall at home, initial encounter 04/05/2022   Family history of breast cancer    aunt   History of adenomatous polyp of colon 02/12/2021   HLD (hyperlipidemia) 08/17/2012   Hypercholesteremia 2000   Hypercholesterolemia 2000   Hypertension    history   Long term (current)  use of anticoagulants 12/14/2021   Long term (current) use of insulin ([place]) 02/12/2021   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   New onset a-fib ([place]) 06/28/2020   Osteoporosis 09/06/2016   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   Persistent atrial fibrillation ([place]) 06/07/2021   Personal history of malignant neoplasm of breast 02/12/2021   Personal history of  radiation therapy    Postmenopausal    took HRT 1999 - 2004   Preop cardiovascular exam 07/03/2020   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   Traumatic ecchymosis of right lower leg 04/05/2022   Unspecified atrial fibrillation ([place]) 07/03/2020    Past Surgical History:  Procedure Laterality Date   BREAST BIOPSY     BREAST LUMPECTOMY  04/2009   rt breast  estrogen +, Her 2 Nu negative   BREAST LUMPECTOMY WITH RADIOACTIVE SEED LOCALIZATION Left 09/07/2020   Procedure: RADIOCATIVE SEED GUIDED LEFT BREAST LUMPECTOMY;  Surgeon: Vickie Overall, MD;  Location: [facility];  Service: General;  Laterality: Left;   COLONOSCOPY WITH PROPOFOL N/A 04/05/2014   Procedure: COLONOSCOPY WITH PROPOFOL;  Surgeon: Vickie Fair, MD;  Location: [facility];  Service: Endoscopy;  Laterality: N/A;   ORIF FOOT FRACTURE Right 04/20/2014   done in [place] EXTRACTION      Current Medications: Current Meds  Medication Sig   apixaban (ELIQUIS) 5 MG TABS tablet Take 1 tablet (5 mg total) by mouth 2 (two) times daily.   Calcium Carbonate-Vitamin D3 600-400 MG-UNIT TABS Take 1 tablet by mouth 2 (two) times daily.   digoxin (LANOXIN) 0.125 MG tablet Take 1 tablet (0.125 mg total) by mouth daily. TAKE 1 TABLET(0.125 MG) BY MOUTH DAILY   docusate sodium (COLACE) 100 MG capsule Take 1 capsule (100 mg total) by mouth every other day.   ezetimibe (ZETIA) 10 MG tablet Take 10 mg by mouth every evening.   ferrous gluconate (FERGON) 324 MG tablet Take 1 tablet (324 mg total) by mouth every other day.   gabapentin (NEURONTIN) 600 MG tablet Take 600 mg by mouth 3 (three) times daily.   HUMALOG MIX 50/50 KWIKPEN (50-50) 100 UNIT/ML KwikPen Inject 10-16 Units into the skin as directed. Take 16 units in the morning, Take 10 units at lunch, Take 14 units in the evening   JARDIANCE 25 MG TABS tablet Take 25 mg by mouth daily.   ketorolac (ACULAR) 0.5 % ophthalmic solution Place 1 drop into the left eye 2 (two)  times daily.   metFORMIN (GLUCOPHAGE) 500 MG tablet Take 1,000 mg by mouth 2 (two) times daily.   rosuvastatin (CRESTOR) 40 MG tablet Take 40 mg by mouth every evening.   vitamin B-12 (CYANOCOBALAMIN) 1000 MCG tablet Take 1,000 mcg by mouth daily.   White Petrolatum-Mineral Oil (ARTIFICIAL EYE OP) Place 1 drop into both eyes in the morning and at bedtime.     Allergies:   Patient has no known allergies.   Social History   Socioeconomic History   Marital status: Widowed    Spouse name: Not on file   Number of children: Not on file   Years of education: Not on file   Highest education level: Not on file  Occupational History   Not on file  Tobacco Use   Smoking status: Former    Packs/day: 0.25    Years: 30.00    Total pack years: 7.50    Types: Cigarettes    Start date: 03/30/1962    Quit date: 11/12/1991  Years since quitting: 30.6   Smokeless tobacco: Never  Vaping Use   Vaping Use: Never used  Substance and Sexual Activity   Alcohol use: No    Alcohol/week: 0.0 standard drinks of alcohol   Drug use: No   Sexual activity: Not Currently    Birth control/protection: Post-menopausal  Other Topics Concern   Not on file  Social History Narrative   2 sons, one adopted   Social Determinants of Health   Financial Resource Strain: Not on file  Food Insecurity: Not on file  Transportation Needs: Not on file  Physical Activity: Not on file  Stress: Not on file  Social Connections: Not on file     Family History: The patient's family history includes Diabetes in her father; Heart disease in her father and mother; Heart failure in her father and mother; Obesity in her brother.  ROS:   Please see the history of present illness.    All other systems reviewed and are negative.  EKGs/Labs/Other Studies Reviewed:    The following studies were reviewed today: EKG reveals atrial fibrillation with well-controlled ventricular rate.   Recent Labs: 04/07/2022: ALT 10; BUN 30;  Creatinine, Ser 0.91; Potassium 3.9; Sodium 134 04/16/2022: Hemoglobin 10.0; Platelets 274  Recent Lipid Panel    Component Value Date/Time   CHOL 115 06/14/2021 0821   TRIG 111 06/14/2021 0821   HDL 47 06/14/2021 0821   CHOLHDL 2.4 06/14/2021 0821   LDLCALC 48 06/14/2021 0821    Physical Exam:    VS:  BP 120/68   Pulse 83   Ht '5\' 10"'$  (1.778 m)   Wt 188 lb 0.6 oz (85.3 kg)   LMP  (LMP Unknown)   SpO2 98%   BMI 26.98 kg/m     Wt Readings from Last 3 Encounters:  07/11/22 188 lb 0.6 oz (85.3 kg)  04/11/22 185 lb 1.3 oz (84 kg)  04/05/22 194 lb 0.1 oz (88 kg)     GEN: Patient is in no acute distress HEENT: Normal NECK: No JVD; No carotid bruits LYMPHATICS: No lymphadenopathy CARDIAC: Hear sounds regular, 2/6 systolic murmur at the apex. RESPIRATORY:  Clear to auscultation without rales, wheezing or rhonchi  ABDOMEN: Soft, non-tender, non-distended MUSCULOSKELETAL:  No edema; No deformity  SKIN: Warm and dry NEUROLOGIC:  Alert and oriented x 3 PSYCHIATRIC:  Normal affect   Signed, Vickie Lindau, MD  07/11/2022 1:37 PM    [facility]

## 2022-07-11 NOTE — Patient Instructions (Signed)

## 2022-07-13 NOTE — Telephone Encounter (Signed)
Patient with diagnosis of atrial fibrillation on Eliquis for anticoagulation.    Procedure: dental extraction with bone graft and implant all in same appointent Date of procedure: TBD  CHA2DS2-VASc Score = 6   This indicates a 9.7% annual risk of stroke. The patient's score is based upon: CHF History: 0 HTN History: 1 Diabetes History: 1 Stroke History: 0 Vascular Disease History: 1 Age Score: 2 Gender Score: 1    CrCl 66 Platelet count 274  Patient does not require pre-op antibiotics for dental procedure.  Per office protocol, patient can hold Eliquis for 2 days prior to procedure.   Patient will not need bridging with Lovenox (enoxaparin) around procedure.  **This guidance is not considered finalized until pre-operative APP has relayed final recommendations.**

## 2022-07-16 ENCOUNTER — Ambulatory Visit (INDEPENDENT_AMBULATORY_CARE_PROVIDER_SITE_OTHER): Payer: Medicare Other | Admitting: Family Medicine

## 2022-07-16 ENCOUNTER — Encounter: Payer: Self-pay | Admitting: Family Medicine

## 2022-07-16 VITALS — BP 110/72 | HR 49 | Temp 98.0°F | Ht 70.0 in | Wt 185.4 lb

## 2022-07-16 DIAGNOSIS — E1165 Type 2 diabetes mellitus with hyperglycemia: Secondary | ICD-10-CM

## 2022-07-16 DIAGNOSIS — Z794 Long term (current) use of insulin: Secondary | ICD-10-CM

## 2022-07-16 NOTE — Progress Notes (Signed)
Chief Complaint  Patient presents with   New Patient (Initial Visit)       New Patient Visit SUBJECTIVE: HPI: Vickie Burns is an 81 y.o.female who is being seen for establishing care.  The patient was previously seen at [facility], her doc is retiring.  DM- hx of DM. Taking Jardiance 25 mg/d, Humalog 50/50 mix TID.  She does follow with endocrinology through [facility].  This is quite a ways for her to drive now that her PCP is no longer practicing.  She is hoping for something closer in [facility] if possible.  She denies any episodes of hypoglycemia.  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   Essential hypertension 10/07/2016   Fall at home, initial encounter 04/05/2022   Family history of breast cancer    aunt   History of adenomatous polyp of colon 02/12/2021   HLD (hyperlipidemia) 08/17/2012   Hypercholesteremia 2000   Hypercholesterolemia 2000   Hypertension    history   Long term (current) use of anticoagulants 12/14/2021   Long term (current) use of insulin ([place]) 02/12/2021   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   New onset a-fib ([place]) 06/28/2020   Osteoporosis 09/06/2016   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   Persistent atrial fibrillation ([place]) 06/07/2021   Personal history of malignant neoplasm of breast 02/12/2021   Personal history of radiation therapy    Postmenopausal    took HRT 1999 - 2004   Thrombophilia ([place])  11/14/2021   Traumatic ecchymosis of right lower leg 04/05/2022   Unspecified atrial fibrillation ([place]) 07/03/2020   Past Surgical History:  Procedure Laterality Date   BREAST BIOPSY     BREAST LUMPECTOMY  04/2009   rt breast  estrogen +, Her 2 Nu negative   BREAST LUMPECTOMY WITH RADIOACTIVE SEED LOCALIZATION Left 09/07/2020   Procedure: RADIOCATIVE SEED GUIDED LEFT BREAST LUMPECTOMY;  Surgeon: Alphonsa Overall, MD;  Location: [facility];  Service: General;  Laterality: Left;   COLONOSCOPY WITH PROPOFOL N/A 04/05/2014   Procedure: COLONOSCOPY WITH PROPOFOL;  Surgeon: Garlan Fair, MD;  [facility];  Service: Endoscopy;  Laterality: N/A;   ORIF FOOT FRACTURE Right 04/20/2014   done in [place] EXTRACTION     Family History  Problem Relation Age of Onset   Heart disease Mother        Confestive Heart Failure   Heart failure Mother    Diabetes Father    Heart disease Father        Congestive Heart Failure   Heart failure Father    Obesity Brother    No Known Allergies  Current Outpatient Medications:    apixaban (ELIQUIS) 5 MG TABS tablet, Take 1 tablet (5 mg total) by mouth 2 (two) times daily., Disp: 180 tablet, Rfl: 3   Calcium Carbonate-Vitamin D3 600-400 MG-UNIT TABS, Take 1 tablet by mouth 2 (two) times daily., Disp: , Rfl:    digoxin (LANOXIN) 0.125 MG tablet, Take 1 tablet (0.125  mg total) by mouth daily. TAKE 1 TABLET(0.125 MG) BY MOUTH DAILY, Disp: 90 tablet, Rfl: 2   docusate sodium (COLACE) 100 MG capsule, Take 1 capsule (100 mg total) by mouth every other day., Disp: 30 capsule, Rfl: 2   ezetimibe (ZETIA) 10 MG tablet, Take 10 mg by mouth every evening., Disp: , Rfl:    ferrous gluconate (FERGON) 324 MG tablet, Take 1 tablet (324 mg total) by mouth every other day., Disp: 30 tablet, Rfl: 2   gabapentin (NEURONTIN) 600 MG tablet, Take 600 mg by mouth 3 (three) times daily., Disp: , Rfl:    HUMALOG MIX 50/50 KWIKPEN (50-50) 100  UNIT/ML KwikPen, Inject 10-16 Units into the skin as directed. Take 16 units in the morning, Take 10 units at lunch, Take 14 units in the evening, Disp: , Rfl:    JARDIANCE 25 MG TABS tablet, Take 25 mg by mouth daily., Disp: , Rfl:    ketorolac (ACULAR) 0.5 % ophthalmic solution, Place 1 drop into the left eye 2 (two) times daily., Disp: , Rfl:    metFORMIN (GLUCOPHAGE) 500 MG tablet, Take 1,000 mg by mouth 2 (two) times daily., Disp: , Rfl:    rosuvastatin (CRESTOR) 40 MG tablet, Take 40 mg by mouth every evening., Disp: , Rfl:    vitamin B-12 (CYANOCOBALAMIN) 1000 MCG tablet, Take 1,000 mcg by mouth daily., Disp: , Rfl:    White Petrolatum-Mineral Oil (ARTIFICIAL EYE OP), Place 1 drop into both eyes in the morning and at bedtime., Disp: , Rfl:   OBJECTIVE: BP 110/72   Pulse (!) 49   Temp 98 F (36.7 C) (Oral)   Ht '5\' 10"'$  (1.778 m)   Wt 185 lb 6 oz (84.1 kg)   LMP  (LMP Unknown)   SpO2 97%   BMI 26.60 kg/m  General:  well developed, well nourished, in no apparent distress Skin:  no significant moles, warts, or growths on exposed skin Lungs:  clear to auscultation, breath sounds equal bilaterally, no respiratory distress Cardio:  regular rhythm, bradycardic, no LE edema or bruits Neuro:  gait normal for age Psych: well oriented with normal range of affect and appropriate judgment/insight  ASSESSMENT/PLAN: Type 2 diabetes mellitus with hyperglycemia, with long-term current use of insulin (HCC) - Plan: Ambulatory referral to Endocrinology  Chronic, stable. Refer endo near our office given proximity.  Continue Jardiance 25 mg daily and Humalog. Patient should return in 1 yr for CPE or prn. The patient voiced understanding and agreement to the plan.   [place], DO 07/16/22  2:31 PM

## 2022-07-16 NOTE — Telephone Encounter (Addendum)
Our office received clearance form back asking for MD to sign. I will update the requesting office that all pre op notes and recommendations are electronically signed. [facility] pre op protocol and format has been in place for about 5 yrs now. The primary cardiologist is well informed and aware of the pt having surgery/procedure's. Any anticoagulation is managed by the Pharm-d and the MD (if needed).  We no longer use a paper format.      Pt was recently seen by Dr. Geraldo Pitter, see notes from Dr. Geraldo Pitter.

## 2022-07-16 NOTE — Patient Instructions (Addendum)
If you do not hear anything about your referral in the next 1-2 weeks, call our office and ask for an update.  Stay hydrated.  Consider a spoonful of pickle juice/mustard prior to bed to help with cramping.   Let us know if you need anything.

## 2022-07-16 NOTE — Telephone Encounter (Signed)
Addressed during office visit by Dr. Geraldo Pitter on 07/11/2022.

## 2022-08-20 ENCOUNTER — Other Ambulatory Visit: Payer: Self-pay | Admitting: Family Medicine

## 2022-08-20 DIAGNOSIS — Z1231 Encounter for screening mammogram for malignant neoplasm of breast: Secondary | ICD-10-CM

## 2022-09-25 ENCOUNTER — Ambulatory Visit
Admission: RE | Admit: 2022-09-25 | Discharge: 2022-09-25 | Disposition: A | Discharge status: Home / Self Care | Payer: Medicare Other | Source: Ambulatory Visit | Attending: Family Medicine | Admitting: Family Medicine

## 2022-09-25 DIAGNOSIS — Z1231 Encounter for screening mammogram for malignant neoplasm of breast: Secondary | ICD-10-CM

## 2022-09-26 ENCOUNTER — Other Ambulatory Visit: Payer: Self-pay | Admitting: Cardiology

## 2022-09-30 ENCOUNTER — Other Ambulatory Visit: Payer: Self-pay | Admitting: Family Medicine

## 2022-09-30 DIAGNOSIS — R928 Other abnormal and inconclusive findings on diagnostic imaging of breast: Secondary | ICD-10-CM

## 2022-10-04 ENCOUNTER — Other Ambulatory Visit: Payer: Self-pay | Admitting: Cardiology

## 2022-10-10 ENCOUNTER — Other Ambulatory Visit: Payer: Self-pay | Admitting: Family Medicine

## 2022-10-10 ENCOUNTER — Ambulatory Visit
Admission: RE | Admit: 2022-10-10 | Discharge: 2022-10-10 | Disposition: A | Discharge status: Home / Self Care | Payer: Medicare Other | Source: Ambulatory Visit | Attending: Family Medicine | Admitting: Family Medicine

## 2022-10-10 DIAGNOSIS — N6489 Other specified disorders of breast: Secondary | ICD-10-CM

## 2022-10-10 DIAGNOSIS — R928 Other abnormal and inconclusive findings on diagnostic imaging of breast: Secondary | ICD-10-CM

## 2022-10-21 ENCOUNTER — Other Ambulatory Visit: Payer: Self-pay

## 2022-10-21 DIAGNOSIS — M8000XD Age-related osteoporosis with current pathological fracture, unspecified site, subsequent encounter for fracture with routine healing: Secondary | ICD-10-CM

## 2022-10-22 ENCOUNTER — Inpatient Hospital Stay (HOSPITAL_BASED_OUTPATIENT_CLINIC_OR_DEPARTMENT_OTHER): Payer: Medicare Other | Admitting: Hematology & Oncology

## 2022-10-22 ENCOUNTER — Inpatient Hospital Stay: Payer: Medicare Other

## 2022-10-22 ENCOUNTER — Encounter: Payer: Self-pay | Admitting: Hematology & Oncology

## 2022-10-22 ENCOUNTER — Inpatient Hospital Stay: Payer: Medicare Other | Attending: Hematology & Oncology

## 2022-10-22 VITALS — BP 133/71 | HR 60 | Temp 98.0°F | Resp 20 | Ht 70.0 in | Wt 189.0 lb

## 2022-10-22 DIAGNOSIS — M8000XD Age-related osteoporosis with current pathological fracture, unspecified site, subsequent encounter for fracture with routine healing: Secondary | ICD-10-CM

## 2022-10-22 DIAGNOSIS — C50011 Malignant neoplasm of nipple and areola, right female breast: Secondary | ICD-10-CM | POA: Diagnosis not present

## 2022-10-22 DIAGNOSIS — Z853 Personal history of malignant neoplasm of breast: Secondary | ICD-10-CM | POA: Insufficient documentation

## 2022-10-22 DIAGNOSIS — Z7901 Long term (current) use of anticoagulants: Secondary | ICD-10-CM | POA: Diagnosis not present

## 2022-10-22 LAB — CBC WITH DIFFERENTIAL (CANCER CENTER ONLY)
Abs Immature Granulocytes: 0.03 10*3/uL (ref 0.00–0.07)
Basophils Absolute: 0.1 10*3/uL (ref 0.0–0.1)
Basophils Relative: 1 %
Eosinophils Absolute: 0.3 10*3/uL (ref 0.0–0.5)
Eosinophils Relative: 3 %
HCT: 43.6 % (ref 36.0–46.0)
Hemoglobin: 13.6 g/dL (ref 12.0–15.0)
Immature Granulocytes: 0 %
Lymphocytes Relative: 13 %
Lymphs Abs: 1.3 10*3/uL (ref 0.7–4.0)
MCH: 28 pg (ref 26.0–34.0)
MCHC: 31.2 g/dL (ref 30.0–36.0)
MCV: 89.9 fL (ref 80.0–100.0)
Monocytes Absolute: 0.9 10*3/uL (ref 0.1–1.0)
Monocytes Relative: 9 %
Neutro Abs: 7.5 10*3/uL (ref 1.7–7.7)
Neutrophils Relative %: 74 %
Platelet Count: 215 10*3/uL (ref 150–400)
RBC: 4.85 MIL/uL (ref 3.87–5.11)
RDW: 16.6 % — ABNORMAL HIGH (ref 11.5–15.5)
WBC Count: 10 10*3/uL (ref 4.0–10.5)
nRBC: 0 % (ref 0.0–0.2)

## 2022-10-22 LAB — CMP (CANCER CENTER ONLY)
ALT: 15 U/L (ref 0–44)
AST: 22 U/L (ref 15–41)
Albumin: 4.4 g/dL (ref 3.5–5.0)
Alkaline Phosphatase: 45 U/L (ref 38–126)
Anion gap: 6 (ref 5–15)
BUN: 21 mg/dL (ref 8–23)
CO2: 31 mmol/L (ref 22–32)
Calcium: 10.5 mg/dL — ABNORMAL HIGH (ref 8.9–10.3)
Chloride: 104 mmol/L (ref 98–111)
Creatinine: 1.06 mg/dL — ABNORMAL HIGH (ref 0.44–1.00)
GFR, Estimated: 53 mL/min — ABNORMAL LOW (ref >60)
Glucose, Bld: 64 mg/dL — ABNORMAL LOW (ref 70–99)
Potassium: 4.8 mmol/L (ref 3.5–5.1)
Sodium: 141 mmol/L (ref 135–145)
Total Bilirubin: 0.5 mg/dL (ref 0.3–1.2)
Total Protein: 6.8 g/dL (ref 6.5–8.1)

## 2022-10-22 NOTE — Progress Notes (Signed)
Hematology and Oncology Follow Up Visit  Vickie Burns [id number] 13-Jan-1941 81 y.o. 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  We last saw her back in April 2022.  Since then, she has been doing okay.  She unfortunately had a mammogram that was done back in middle of November.  This did show some "distortion" in the left breast.  A diagnostic mammogram was done which confirmed this.  She is going to undergo a biopsy on December 19.  She has not noted any change in the left breast.  I know that in the past she has had biopsies in the left breast which were negative.  She has had no issues with nausea or vomiting.  She has had no cough.  She has had problems with COVID.  She did fall back in the summertime.  She bled.  She is on Eliquis.  Her hemoglobin dropped quite a bit.  Thankfully has come back up.  I told her to stop taking iron pills.    She does have diabetes.  She is to work on the diabetes.  She has had no problems with fever.  There is been no issues with bowels or bladder.  She has had no rashes.  There is been no leg swelling.  Overall, I would say performance status is ECOG 1.   Medications:  Allergies as of 10/22/2022   No Known Allergies      Medication List        Accurate as of October 22, 2022 10:33 AM. If you have any questions, ask your nurse or doctor.          apixaban 5 MG Tabs tablet Commonly known as: ELIQUIS Take 1 tablet (5 mg total) by mouth 2 (two) times daily.   ARTIFICIAL EYE OP Place 1 drop into both eyes in the morning and at bedtime.   Calcium Carbonate-Vitamin D3 600-400 MG-UNIT Tabs Take 1 tablet by mouth 2 (two) times daily.   cyanocobalamin 1000 MCG tablet Commonly known as: VITAMIN  B12 Take 1,000 mcg by mouth daily.   digoxin 0.125 MG tablet Commonly known as: LANOXIN Take 1 tablet (125 mcg total) by mouth daily.   docusate sodium 100 MG capsule Commonly known as: Colace Take 1 capsule (100 mg total) by mouth every other day.   ezetimibe 10 MG tablet Commonly known as: ZETIA Take 10 mg by mouth every evening.   ferrous gluconate 324 MG tablet Commonly known as: FERGON Take 1 tablet (324 mg total) by mouth every other day.   gabapentin 600 MG tablet Commonly known as: NEURONTIN Take 600 mg by mouth 3 (three) times daily.   HumaLOG Mix 50/50 KwikPen (50-50) 100 UNIT/ML Kwikpen Generic drug: Insulin Lispro Prot & Lispro Inject 10-16 Units into the skin as directed. Take 16 units in the morning, Take 10 units at lunch, Take 14 units in the evening   Jardiance 25 MG Tabs tablet Generic drug: empagliflozin Take 25 mg by mouth daily.   ketorolac 0.5 % ophthalmic solution Commonly known as: ACULAR Place 1 drop into the left eye 2 (two) times daily.   metFORMIN 500 MG tablet Commonly known as: GLUCOPHAGE Take 1,000 mg by mouth 2 (two) times daily.  Novofine Pen Needle 32G X 6 MM Misc Generic drug: Insulin Pen Needle Inject into the skin.   rosuvastatin 40 MG tablet Commonly known as: CRESTOR Take 40 mg by mouth every evening.        Allergies: No Known Allergies  Past Medical History, Surgical history, Social history, and Family History were reviewed and updated.  Review of Systems: Review of Systems  Constitutional: Negative.   HENT: Negative.    Eyes: Negative.   Respiratory: Negative.    Cardiovascular: Negative.   Gastrointestinal: Negative.   Genitourinary: Negative.   Musculoskeletal: Negative.   Skin: Negative.   Neurological: Negative.   Endo/Heme/Allergies:  Bruises/bleeds easily.  Psychiatric/Behavioral: Negative.     Marland Kitchen   Physical Exam:  height is '5\' 10"'$  (1.778 m) and weight is 189 lb (85.7 kg). Her oral temperature is  98 F (36.7 C). Her blood pressure is 133/71 and her pulse is 60. Her respiration is 20 and oxygen saturation is 98%.   Wt Readings from Last 3 Encounters:  10/22/22 189 lb (85.7 kg)  07/16/22 185 lb 6 oz (84.1 kg)  07/11/22 188 lb 0.6 oz (85.3 kg)    Physical Exam Vitals reviewed.  Constitutional:      Comments: Her breast exam shows the right breast be slightly contracted from surgery and radiation.  She has the lumpectomy scar at the edge of the areola at about the 11 o'clock position.  There is no mass in the right breast.  There is no right axillary adenopathy.  Left breast shows the lumpectomy scar at the edge of the areola at about the 1 o'clock position.  There is no mass, edema or erythema in the left breast.  There is no left axillary adenopathy.  HENT:     Head: Normocephalic and atraumatic.  Eyes:     Pupils: Pupils are equal, round, and reactive to light.  Cardiovascular:     Rate and Rhythm: Normal rate and regular rhythm.     Heart sounds: Normal heart sounds.  Pulmonary:     Effort: Pulmonary effort is normal.     Breath sounds: Normal breath sounds.  Abdominal:     General: Bowel sounds are normal.     Palpations: Abdomen is soft.  Musculoskeletal:        General: No tenderness or deformity. Normal range of motion.     Cervical back: Normal range of motion.     Comments: Extremities shows a substantial ecchymoses in the left lower leg.  This is below the knee.  There is no obvious hematoma.  She has good circulation in her distal extremities.  Lymphadenopathy:     Cervical: No cervical adenopathy.  Skin:    General: Skin is warm and dry.     Findings: No erythema or rash.  Neurological:     Mental Status: She is alert and oriented to person, place, and time.  Psychiatric:        Behavior: Behavior normal.        Thought Content: Thought content normal.        Judgment: Judgment normal.      Lab Results  Component Value Date   WBC 10.0 10/22/2022   HGB  13.6 10/22/2022   HCT 43.6 10/22/2022   MCV 89.9 10/22/2022   PLT 215 10/22/2022   No results found for: "FERRITIN", "IRON", "TIBC", "UIBC", "IRONPCTSAT" Lab Results  Component Value Date   RBC 4.85 10/22/2022   No results found for: "KPAFRELGTCHN", "LAMBDASER", "KAPLAMBRATIO"  No results found for: "IGGSERUM", "IGA", "IGMSERUM" No results found for: "TOTALPROTELP", "ALBUMINELP", "A1GS", "A2GS", "BETS", "BETA2SER", "GAMS", "MSPIKE", "SPEI"   Chemistry      Component Value Date/Time   NA 141 10/22/2022 0907   NA 139 10/12/2021 1135   NA 144 09/26/2017 1143   NA 138 09/29/2015 1410   K 4.8 10/22/2022 0907   K 4.3 09/26/2017 1143   K 4.5 09/29/2015 1410   CL 104 10/22/2022 0907   CL 106 09/26/2017 1143   CO2 31 10/22/2022 0907   CO2 27 09/26/2017 1143   CO2 27 09/29/2015 1410   BUN 21 10/22/2022 0907   BUN 21 10/12/2021 1135   BUN 11 09/26/2017 1143   BUN 17.4 09/29/2015 1410   CREATININE 1.06 (H) 10/22/2022 0907   CREATININE 1.1 09/26/2017 1143   CREATININE 1.0 09/29/2015 1410      Component Value Date/Time   CALCIUM 10.5 (H) 10/22/2022 0907   CALCIUM 10.0 09/26/2017 1143   CALCIUM 9.5 09/29/2015 1410   ALKPHOS 45 10/22/2022 0907   ALKPHOS 48 09/26/2017 1143   ALKPHOS 51 09/29/2015 1410   AST 22 10/22/2022 0907   AST 18 09/29/2015 1410   ALT 15 10/22/2022 0907   ALT 23 09/26/2017 1143   ALT 15 09/29/2015 1410   BILITOT 0.5 10/22/2022 0907   BILITOT 0.37 09/29/2015 1410      Impression and Plan: Ms. Platts is a very pleasant 81 yo caucasian female with history of stage I infiltrating ductal carcinoma of the right breast.    She was treated with lumpectomy and radiation.   She completed 5 years of Femara in October 2015. She continues to do well and so far there has been no evidence of recurrence.   We will proceed with Zometa today as planned.   I hope that his biopsy comes back negative.  Again she had a biopsy a couple years ago with was also negative.  As  always, we will plan to see her back in 1 year.  She was gets Zometa when we see her back.   Marland Kitchen   Volanda Napoleon, MD 12/12/202310:33 AM

## 2022-10-22 NOTE — Progress Notes (Signed)
After placing PIV for zometa pt stated she is having possible teeth implants placed in January 2024 after having her bone graft last month. Dr. Marin Olp notified and pt to not receive zometa. Pt aware that we are holding infusion due to dental work. Per Dr. Marin Olp patient ok to wait until next year for zometa. Pt aware of plan of care and had no further questions.

## 2022-10-29 ENCOUNTER — Ambulatory Visit
Admission: RE | Admit: 2022-10-29 | Discharge: 2022-10-29 | Disposition: A | Discharge status: Home / Self Care | Payer: Medicare Other | Source: Ambulatory Visit | Attending: Family Medicine | Admitting: Family Medicine

## 2022-10-29 DIAGNOSIS — R928 Other abnormal and inconclusive findings on diagnostic imaging of breast: Secondary | ICD-10-CM

## 2022-10-29 DIAGNOSIS — N6489 Other specified disorders of breast: Secondary | ICD-10-CM

## 2022-10-29 HISTORY — PX: BREAST BIOPSY: SHX20

## 2022-10-31 ENCOUNTER — Telehealth: Payer: Self-pay

## 2022-10-31 NOTE — Telephone Encounter (Signed)
Called and informed patient of results. She was very appreciative of results and declines any questions or concerns at this time.

## 2022-10-31 NOTE — Telephone Encounter (Signed)
-----   Message from Volanda Napoleon, MD sent at 10/31/2022 12:26 PM EST ----- Please call her and let her know about the great news about the breast biopsy.  No cancer was seen.  Thanks.  Laurey Arrow

## 2022-12-04 LAB — LAB REPORT - SCANNED
A1c: 6.8
Microalbumin, Urine: 1.76

## 2022-12-09 ENCOUNTER — Other Ambulatory Visit: Payer: Self-pay | Admitting: Cardiology

## 2023-01-13 ENCOUNTER — Other Ambulatory Visit: Payer: Self-pay

## 2023-01-29 ENCOUNTER — Encounter: Payer: Self-pay | Admitting: Cardiology

## 2023-01-29 ENCOUNTER — Ambulatory Visit: Payer: Medicare Other | Attending: Cardiology | Admitting: Cardiology

## 2023-01-29 VITALS — BP 114/68 | HR 76 | Ht 70.0 in | Wt 187.0 lb

## 2023-01-29 DIAGNOSIS — I4891 Unspecified atrial fibrillation: Secondary | ICD-10-CM

## 2023-01-29 DIAGNOSIS — I2510 Atherosclerotic heart disease of native coronary artery without angina pectoris: Secondary | ICD-10-CM | POA: Diagnosis not present

## 2023-01-29 DIAGNOSIS — I10 Essential (primary) hypertension: Secondary | ICD-10-CM

## 2023-01-29 DIAGNOSIS — E088 Diabetes mellitus due to underlying condition with unspecified complications: Secondary | ICD-10-CM

## 2023-01-29 DIAGNOSIS — E7800 Pure hypercholesterolemia, unspecified: Secondary | ICD-10-CM

## 2023-01-29 NOTE — Patient Instructions (Signed)
Medication Instructions:  Your physician recommends that you continue on your current medications as directed. Please refer to the Current Medication list given to you today.  *If you need a refill on your cardiac medications before your next appointment, please call your pharmacy*   Lab Work: None ordered If you have labs (blood work) drawn today and your tests are completely normal, you will receive your results only by: MyChart Message (if you have MyChart) OR A paper copy in the mail If you have any lab test that is abnormal or we need to change your treatment, we will call you to review the results.   Testing/Procedures: None ordered   Follow-Up: At [facility], you and your health needs are our priority.  As part of our continuing mission to provide you with exceptional heart care, we have created designated Provider Care Teams.  These Care Teams include your primary Cardiologist (physician) and Advanced Practice Providers (APPs -  Physician Assistants and Nurse Practitioners) who all work together to provide you with the care you need, when you need it.  We recommend signing up for the patient portal called "MyChart".  Sign up information is provided on this After Visit Summary.  MyChart is used to connect with patients for Virtual Visits (Telemedicine).  Patients are able to view lab/test results, encounter notes, upcoming appointments, etc.  Non-urgent messages can be sent to your provider as well.   To learn more about what you can do with MyChart, go to [email or web address].    Your next appointment:   6 month(s)  The format for your next appointment:   In Person  Provider:   Rajan [name], MD    Other Instructions none  Important Information About Sugar       

## 2023-01-29 NOTE — Progress Notes (Signed)
Cardiology Office Note:    Date:  01/29/2023   ID:  Vickie Burns, Vickie Burns October 13, 1941, MRN [id number]  PCP:  Shelda Pal, DO  Cardiologist:  Jenean Lindau, MD   Referring MD: Lajean Manes, MD    ASSESSMENT:    1. Atrial fibrillation, unspecified type ([place])   2. Coronary arteriosclerosis   3. Essential hypertension   4. Diabetes mellitus due to underlying condition with unspecified complications ([place])   5. Hypercholesterolemia    PLAN:    In order of problems listed above:  Coronary arteriosclerosis: Secondary prevention stressed to the patient.  Importance of compliance with diet medication stressed and she vocalized understanding.  She was advised to walk or bicycle at least half an hour a day 5 days a week and she promises to stop. Essential hypertension: Blood pressure stable and diet was emphasized. Atrial fibrillation:I discussed with the patient atrial fibrillation, disease process. Management and therapy including rate and rhythm control, anticoagulation benefits and potential risks were discussed extensively with the patient. Patient had multiple questions which were answered to patient's satisfaction. Mixed dyslipidemia: On lipid-lowering medications.  Followed by primary care doctor.  Diet emphasized. Diabetes mellitus solution: Diet emphasized.  Hemoglobin A1c is 6.8.  I discussed this with her at length. Patient will be seen in follow-up appointment in 6 months or earlier if the patient has any concerns.    Medication Adjustments/Labs and Tests Ordered: Current medicines are reviewed at length with the patient today.  Concerns regarding medicines are outlined above.  No orders of the defined types were placed in this encounter.  No orders of the defined types were placed in this encounter.    No chief complaint on file.    History of Present Illness:    Vickie Burns is a 82 y.o. female.  Patient has past medical history of atrial  fibrillation/atrial flutter, essential hypertension, mixed dyslipidemia, coronary artery disease and diabetes mellitus on insulin.  She denies any problems at this time and takes care of activities of daily living.  No chest pain orthopnea or PND.  She is a sedentary lifestyle because of orthopedic issues involving her feet.  At the time of my evaluation, the patient is alert awake oriented and in no distress.  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   Essential hypertension 10/07/2016   Fall at home, initial encounter 04/05/2022   Family history of breast cancer    aunt   History of adenomatous polyp of colon 02/12/2021   HLD (hyperlipidemia) 08/17/2012   Hypercholesteremia 2000   Hypercholesterolemia 2000   Hypertension    history   Long term (current) use of anticoagulants 12/14/2021   Long term (current) use of insulin ([place]) 02/12/2021   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   New onset a-fib ([place]) 06/28/2020   Osteoporosis 09/06/2016   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   Persistent atrial fibrillation ([place]) 06/07/2021   Personal history of malignant neoplasm of breast 02/12/2021   Personal history of radiation  therapy    Postmenopausal    took HRT 1999 - 2004   Preop cardiovascular exam 07/03/2020   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   Traumatic ecchymosis of right lower leg 04/05/2022   Unspecified atrial fibrillation ([place]) 07/03/2020     Past Surgical History:  Procedure Laterality Date   BREAST BIOPSY     BREAST BIOPSY Left 10/29/2022   MM LT BREAST BX W LOC DEV 1ST LESION IMAGE BX SPEC STEREO GUIDE 10/29/2022 [facility]   BREAST EXCISIONAL BIOPSY Left 09/07/2020   BREAST LUMPECTOMY  04/2009   rt breast  estrogen +, Her 2 Nu negative   BREAST LUMPECTOMY WITH RADIOACTIVE SEED LOCALIZATION Left 09/07/2020   Procedure: RADIOCATIVE SEED GUIDED LEFT BREAST LUMPECTOMY;  Surgeon: Alphonsa Overall, MD;  Location: [facility];  Service: General;  Laterality: Left;   COLONOSCOPY WITH PROPOFOL N/A 04/05/2014   Procedure: COLONOSCOPY WITH PROPOFOL;  Surgeon: Garlan Fair, MD;  Location: [facility];  Service: Endoscopy;  Laterality: N/A;   ORIF FOOT FRACTURE Right 04/20/2014   done in [place] EXTRACTION      Current Medications: Current Meds  Medication Sig   apixaban (ELIQUIS) 5 MG TABS tablet Take 1 tablet (5 mg total) by mouth 2 (two) times daily.   Calcium Carbonate-Vitamin D3 600-400 MG-UNIT TABS Take 1 tablet by mouth 2 (two) times daily.   digoxin (LANOXIN) 0.125 MG tablet Take 1 tablet (125 mcg total) by mouth daily.   ezetimibe (ZETIA) 10 MG tablet Take 10 mg by mouth every evening.   gabapentin (NEURONTIN) 600 MG tablet Take 600 mg by mouth 3 (three) times daily.   HUMALOG MIX 50/50 KWIKPEN (50-50) 100 UNIT/ML KwikPen Inject 10-16 Units into the skin as directed. Take 16 units in the morning, Take 10 units at lunch, Take 14 units in the evening   JARDIANCE 25 MG TABS tablet Take 25 mg by mouth daily.   ketorolac (ACULAR) 0.5 % ophthalmic solution Place 1 drop into the left eye 2 (two) times daily.   metFORMIN (GLUCOPHAGE) 500 MG tablet Take 1,000 mg by mouth 2 (two) times daily.   rosuvastatin (CRESTOR) 40 MG tablet Take 40 mg by mouth every evening.   vitamin B-12 (CYANOCOBALAMIN) 1000 MCG tablet Take 1,000 mcg by mouth daily.   White Petrolatum-Mineral Oil (ARTIFICIAL EYE  OP) Place 1 drop into both eyes in the morning and at bedtime.     Allergies:   Patient has no known allergies.   Social History   Socioeconomic History   Marital status: Widowed    Spouse name: Not on file   Number of children: Not on file   Years of education: Not on file   Highest education level: Not on file  Occupational History   Not on file  Tobacco Use   Smoking status: Former    Packs/day: 0.25    Years: 30.00    Additional pack years: 0.00    Total pack years: 7.50    Types: Cigarettes    Start date: 03/30/1962    Quit date: 11/12/1991    Years since quitting: 31.2   Smokeless tobacco: Never  Vaping Use   Vaping Use: Never used  Substance and Sexual Activity   Alcohol use: No    Alcohol/week: 0.0 standard drinks of alcohol   Drug use: No   Sexual activity: Not Currently    Birth control/protection: Post-menopausal  Other Topics Concern   Not on file  Social History Narrative  2 sons, one adopted   Social Determinants of Radio broadcast assistant Strain: Not on file  Food Insecurity: Not on file  Transportation Needs: Not on file  Physical Activity: Not on file  Stress: Not on file  Social Connections: Not on file     Family History: The patient's family history includes Diabetes in her father; Heart disease in her father and mother; Heart failure in her father and mother; Obesity in her brother.  ROS:   Please see the history of present illness.    All other systems reviewed and are negative.  EKGs/Labs/Other Studies Reviewed:    The following studies were reviewed today: I discussed my findings with the patient at length.   Recent Labs: 10/22/2022: ALT 15; BUN 21; Creatinine 1.06; Hemoglobin 13.6; Platelet Count 215; Potassium 4.8; Sodium 141  Recent Lipid Panel    Component Value Date/Time   CHOL 115 06/14/2021 0821   TRIG 111 06/14/2021 0821   HDL 47 06/14/2021 0821   CHOLHDL 2.4 06/14/2021 0821   LDLCALC 48 06/14/2021 0821     Physical Exam:    VS:  BP 114/68   Pulse 76   Ht 5\' 10"  (1.778 m)   Wt 187 lb 0.6 oz (84.8 kg)   LMP  (LMP Unknown)   SpO2 99%   BMI 26.84 kg/m     Wt Readings from Last 3 Encounters:  01/29/23 187 lb 0.6 oz (84.8 kg)  10/22/22 189 lb (85.7 kg)  07/16/22 185 lb 6 oz (84.1 kg)     GEN: Patient is in no acute distress HEENT: Normal NECK: No JVD; No carotid bruits LYMPHATICS: No lymphadenopathy CARDIAC: Hear sounds regular, 2/6 systolic murmur at the apex. RESPIRATORY:  Clear to auscultation without rales, wheezing or rhonchi  ABDOMEN: Soft, non-tender, non-distended MUSCULOSKELETAL:  No edema; No deformity  SKIN: Warm and dry NEUROLOGIC:  Alert and oriented x 3 PSYCHIATRIC:  Normal affect   Signed, Jenean Lindau, MD  01/29/2023 10:02 AM    [place]

## 2023-04-21 ENCOUNTER — Other Ambulatory Visit: Payer: Self-pay | Admitting: Cardiology

## 2023-04-21 DIAGNOSIS — I480 Paroxysmal atrial fibrillation: Secondary | ICD-10-CM

## 2023-04-21 NOTE — Telephone Encounter (Signed)
Eliquis 5mg  refill request received. Patient is 82 years old, weight-84.8kg, Crea- 1.06 on 10/22/22, Diagnosis-Afib, and last seen by Dr. Tomie China on 01/29/23. Dose is appropriate based on dosing criteria. Will send in refill to requested pharmacy.

## 2023-05-01 ENCOUNTER — Other Ambulatory Visit: Payer: Self-pay | Admitting: Cardiology

## 2023-06-11 ENCOUNTER — Other Ambulatory Visit: Payer: Self-pay | Admitting: Internal Medicine

## 2023-06-11 DIAGNOSIS — R0989 Other specified symptoms and signs involving the circulatory and respiratory systems: Secondary | ICD-10-CM

## 2023-06-26 ENCOUNTER — Ambulatory Visit
Admission: RE | Admit: 2023-06-26 | Discharge: 2023-06-26 | Disposition: A | Discharge status: Home / Self Care | Payer: Medicare Other | Source: Ambulatory Visit | Attending: Internal Medicine | Admitting: Internal Medicine

## 2023-06-26 ENCOUNTER — Encounter (INDEPENDENT_AMBULATORY_CARE_PROVIDER_SITE_OTHER): Payer: Self-pay

## 2023-06-26 DIAGNOSIS — R0989 Other specified symptoms and signs involving the circulatory and respiratory systems: Secondary | ICD-10-CM

## 2023-07-21 ENCOUNTER — Other Ambulatory Visit (HOSPITAL_BASED_OUTPATIENT_CLINIC_OR_DEPARTMENT_OTHER): Payer: Self-pay

## 2023-07-21 ENCOUNTER — Encounter: Payer: Self-pay | Admitting: Family Medicine

## 2023-07-21 ENCOUNTER — Ambulatory Visit (INDEPENDENT_AMBULATORY_CARE_PROVIDER_SITE_OTHER): Payer: Medicare Other | Admitting: Family Medicine

## 2023-07-21 ENCOUNTER — Encounter: Payer: Self-pay | Admitting: Hematology & Oncology

## 2023-07-21 VITALS — BP 120/80 | HR 60 | Temp 97.9°F | Ht 70.0 in | Wt 181.1 lb

## 2023-07-21 DIAGNOSIS — Z0000 Encounter for general adult medical examination without abnormal findings: Secondary | ICD-10-CM | POA: Diagnosis not present

## 2023-07-21 DIAGNOSIS — E2839 Other primary ovarian failure: Secondary | ICD-10-CM

## 2023-07-21 DIAGNOSIS — E1165 Type 2 diabetes mellitus with hyperglycemia: Secondary | ICD-10-CM

## 2023-07-21 DIAGNOSIS — Z794 Long term (current) use of insulin: Secondary | ICD-10-CM

## 2023-07-21 MED ORDER — COVID-19 MRNA VAC-TRIS(PFIZER) 30 MCG/0.3ML IM SUSY
0.3000 mL | PREFILLED_SYRINGE | Freq: Once | INTRAMUSCULAR | 0 refills | Status: AC
  Filled 2023-07-21: qty 0.3, 1d supply, fill #0

## 2023-07-21 NOTE — Patient Instructions (Addendum)
If you do not hear anything about your referral in the next 1-2 weeks, call our office and ask for an update.  Keep the diet clean and stay active.  Aim to do some physical exertion for 150 minutes per week. This is typically divided into 5 days per week, 30 minutes per day. The activity should be enough to get your heart rate up. Anything is better than nothing if you have time constraints.  Please consider adding some weight resistance exercise to your routine. Consider yoga as well.   Please get me a copy of your advanced directive form at your convenience.   Let us know if you need anything.

## 2023-07-21 NOTE — Progress Notes (Signed)
Chief Complaint  Patient presents with   Annual Exam     Well Woman Vickie Burns is here for a complete physical.   Her last physical was >1 year ago.  Current diet: in general, a "healthy" diet. Current exercise: some walking. Weight is stable and she denies daytime fatigue. Seatbelt? Yes Advanced directive? Yes  Health Maintenance Shingrix- Yes DEXA- Due Tetanus- Yes Pneumonia- Yes  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   Essential hypertension 10/07/2016   Fall at home, initial encounter 04/05/2022   Family history of breast cancer    aunt   History of adenomatous polyp of colon 02/12/2021   HLD (hyperlipidemia) 08/17/2012   Hypercholesteremia 2000   Hypercholesterolemia 2000   Hypertension    history   Long term (current) use of anticoagulants 12/14/2021   Long term (current) use of insulin (HCC) 02/12/2021   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   New onset a-fib (HCC) 06/28/2020   Osteoporosis 09/06/2016   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   Persistent atrial fibrillation (HCC) 06/07/2021   Personal history of malignant neoplasm of breast 02/12/2021   Personal history of radiation therapy    Postmenopausal    took HRT 1999 - 2004   Preop cardiovascular exam 07/03/2020   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   Traumatic ecchymosis of right lower leg 04/05/2022   Unspecified atrial fibrillation (HCC) 07/03/2020     Past  Surgical History:  Procedure Laterality Date   BREAST BIOPSY     BREAST BIOPSY Left 10/29/2022   MM LT BREAST BX W LOC DEV 1ST LESION IMAGE BX SPEC STEREO GUIDE 10/29/2022 [facility]   BREAST EXCISIONAL BIOPSY Left 09/07/2020   BREAST LUMPECTOMY  04/2009   rt breast  estrogen +, Her 2 Nu negative   BREAST LUMPECTOMY WITH RADIOACTIVE SEED LOCALIZATION Left 09/07/2020   Procedure: RADIOCATIVE SEED GUIDED LEFT BREAST LUMPECTOMY;  Surgeon: Ovidio Kin, MD;  Location:  SUR[facility]ERY CENTER;  Service: General;  Laterality: Left;   COLONOSCOPY WITH PROPOFOL N/A 04/05/2014   Procedure: COLONOSCOPY WITH PROPOFOL;  Surgeon: Charolett Bumpers, MD;  Location: [facility];  Service: Endoscopy;  Laterality: N/A;   ORIF FOOT FRACTURE Right 04/20/2014   done in [place]   WISDOM TOOTH EXTRACTION      Medications  Current Outpatient Medications on File Prior to Visit  Medication Sig Dispense Refill   apixaban (ELIQUIS) 5 MG TABS tablet TAKE 1 TABLET BY MOUTH TWICE  DAILY 180 tablet 1   Calcium Carbonate-Vitamin D3 600-400 MG-UNIT TABS Take 1 tablet by mouth 2 (two) times daily.     digoxin (LANOXIN) 0.125 MG tablet Take 1 tablet (125 mcg total) by mouth daily. 90 tablet 2   ezetimibe (ZETIA) 10 MG tablet Take 10 mg by mouth every evening.     gabapentin (NEURONTIN) 600 MG tablet Take 600 mg by mouth 3 (three) times daily.     HUMALOG MIX 50/50 KWIKPEN (50-50) 100 UNIT/ML KwikPen Inject 10-16 Units into the skin as directed.  Take 16 units in the morning, Take 10 units at lunch, Take 14 units in the evening     JARDIANCE 25 MG TABS tablet Take 25 mg by mouth daily.     metFORMIN (GLUCOPHAGE) 500 MG tablet Take 1,000 mg by mouth 2 (two) times daily.     rosuvastatin (CRESTOR) 40 MG tablet Take 40 mg by mouth every evening.     vitamin B-12 (CYANOCOBALAMIN) 1000 MCG tablet Take 1,000 mcg by mouth daily.     White Petrolatum-Mineral Oil (ARTIFICIAL EYE OP) Place 1 drop into both eyes in the  morning and at bedtime.      Allergies No Known Allergies  Review of Systems: Constitutional:  no fevers Eye:  no recent significant change in vision Ears:  No changes in hearing Nose/Mouth/Throat:  no complaints of nasal congestion, no sore throat Cardiovascular: no chest pain Respiratory:  No shortness of breath Gastrointestinal:  No change in bowel habits GU:  Female: negative for dysuria Integumentary:  no abnormal skin lesions reported Neurologic:  no headaches Endocrine:  denies unexplained weight changes  Exam BP 120/80 (BP Location: Right Arm, Patient Position: Sitting, Cuff Size: Normal)   Pulse 60   Temp 97.9 F (36.6 C) (Oral)   Ht 5\' 10"  (1.778 m)   Wt 181 lb 2 oz (82.2 kg)   LMP  (LMP Unknown)   SpO2 99%   BMI 25.99 kg/m  General:  well developed, well nourished, in no apparent distress Skin:  no significant moles, warts, or growths Head:  no masses, lesions, or tenderness Eyes:  pupils equal and round, sclera anicteric without injection Ears:  canals without lesions, TMs shiny without retraction, no obvious effusion, no erythema Nose:  nares patent, mucosa normal, and no drainage Throat/Pharynx:  lips and gingiva without lesion; tongue and uvula midline; non-inflamed pharynx; no exudates or postnasal drainage Neck: neck supple without adenopathy, thyromegaly, or masses Lungs:  clear to auscultation, breath sounds equal bilaterally, no respiratory distress Cardio:  regular rate and rhythm, no bruits or LE edema Abdomen:  abdomen soft, nontender; bowel sounds normal; no masses or organomegaly Genital: Deferred Neuro:  gait normal; deep tendon reflexes normal and symmetric Psych: well oriented with normal range of affect and appropriate judgment/insight  Assessment and Plan  Well adult exam  Type 2 diabetes mellitus with hyperglycemia, with long-term current use of insulin (HCC) - Plan: Ambulatory referral to Endocrinology  Estrogen deficiency - Plan: DG  Bone Density   Well 82 y.o. female. Counseled on diet and exercise. Advanced directive form requested today.  Patient requesting an endocrinologist closer to our area, referral placed to [facility]. Other orders as above. Follow up in 1 yr. The patient voiced understanding and agreement to the plan.  Jilda Roche [place], DO 07/21/23 10:21 AM

## 2023-07-30 ENCOUNTER — Ambulatory Visit: Payer: Medicare Other | Admitting: Cardiology

## 2023-08-06 ENCOUNTER — Encounter: Payer: Self-pay | Admitting: Cardiology

## 2023-08-06 ENCOUNTER — Other Ambulatory Visit (HOSPITAL_BASED_OUTPATIENT_CLINIC_OR_DEPARTMENT_OTHER): Payer: Self-pay

## 2023-08-06 ENCOUNTER — Ambulatory Visit: Payer: Medicare Other | Attending: Cardiology | Admitting: Cardiology

## 2023-08-06 VITALS — BP 124/74 | HR 59 | Ht 70.0 in | Wt 186.1 lb

## 2023-08-06 DIAGNOSIS — I2510 Atherosclerotic heart disease of native coronary artery without angina pectoris: Secondary | ICD-10-CM | POA: Diagnosis not present

## 2023-08-06 DIAGNOSIS — E7800 Pure hypercholesterolemia, unspecified: Secondary | ICD-10-CM

## 2023-08-06 DIAGNOSIS — I4819 Other persistent atrial fibrillation: Secondary | ICD-10-CM

## 2023-08-06 DIAGNOSIS — E088 Diabetes mellitus due to underlying condition with unspecified complications: Secondary | ICD-10-CM | POA: Diagnosis not present

## 2023-08-06 DIAGNOSIS — R600 Localized edema: Secondary | ICD-10-CM

## 2023-08-06 DIAGNOSIS — I10 Essential (primary) hypertension: Secondary | ICD-10-CM

## 2023-08-06 DIAGNOSIS — Z7901 Long term (current) use of anticoagulants: Secondary | ICD-10-CM

## 2023-08-06 MED ORDER — FLUAD 0.5 ML IM SUSY
PREFILLED_SYRINGE | INTRAMUSCULAR | 0 refills | Status: DC
  Filled 2023-08-06: qty 0.5, 1d supply, fill #0

## 2023-08-06 NOTE — Patient Instructions (Signed)
Medication Instructions:  Your physician recommends that you continue on your current medications as directed. Please refer to the Current Medication list given to you today.  *If you need a refill on your cardiac medications before your next appointment, please call your pharmacy*   Lab Work: None ordered If you have labs (blood work) drawn today and your tests are completely normal, you will receive your results only by: MyChart Message (if you have MyChart) OR A paper copy in the mail If you have any lab test that is abnormal or we need to change your treatment, we will call you to review the results.   Testing/Procedures: Your physician has requested that you have a lower venous duplex. This test is an ultrasound of the veins in the legs. It looks at venous blood flow that carries blood from the heart to the legs or arms. Allow one hour for a Lower Venous exam. There are no restrictions or special instructions.     Follow-Up: At [facility], you and your health needs are our priority.  As part of our continuing mission to provide you with exceptional heart care, we have created designated Provider Care Teams.  These Care Teams include your primary Cardiologist (physician) and Advanced Practice Providers (APPs -  Physician Assistants and Nurse Practitioners) who all work together to provide you with the care you need, when you need it.  We recommend signing up for the patient portal called "MyChart".  Sign up information is provided on this After Visit Summary.  MyChart is used to connect with patients for Virtual Visits (Telemedicine).  Patients are able to view lab/test results, encounter notes, upcoming appointments, etc.  Non-urgent messages can be sent to your provider as well.   To learn more about what you can do with MyChart, go to [email or web address].    Your next appointment:   9 month(s)  The format for your next appointment:   In Person  Provider:   Belva Crome, MD    Other Instructions none  Important Information About Sugar

## 2023-08-06 NOTE — Progress Notes (Signed)
Cardiology Office Note:    Date:  08/06/2023   ID:  Vickie, Burns 23-Apr-1941, MRN [id number]  PCP:  Sharlene Dory, DO  Cardiologist:  Garwin Brothers, MD   Referring MD: Sharlene Dory*    ASSESSMENT:    1. Coronary arteriosclerosis   2. Persistent atrial fibrillation (HCC)   3. Essential hypertension   4. Diabetes mellitus due to underlying condition with unspecified complications (HCC)   5. Hypercholesterolemia   6. Long term (current) use of anticoagulants    PLAN:    In order of problems listed above:  Coronary artery disease: Secondary prevention stressed with the patient.  Importance of compliance with diet medication stressed and she vocalized understanding.  She was advised to ambulate and exercise to the best of her ability and she is very meticulous about following instructions.  Exercise protocol discussed. Persistent atrial fibrillation:I discussed with the patient atrial fibrillation, disease process. Management and therapy including rate and rhythm control, anticoagulation benefits and potential risks were discussed extensively with the patient. Patient had multiple questions which were answered to patient's satisfaction. Dyslipidemia: On lipid-lowering medications.  Lipids were reviewed from [facility] sheet and found to be fine. Essential hypertension: Stable blood pressure. Diabetes mellitus: Followed by primary care.  Hemoglobin A1c is better.  Diet discussed. Patient will be seen in follow-up appointment in 9 months or earlier if the patient has any concerns.    Medication Adjustments/Labs and Tests Ordered: Current medicines are reviewed at length with the patient today.  Concerns regarding medicines are outlined above.  Orders Placed This Encounter  Procedures   EKG 12-Lead   No orders of the defined types were placed in this encounter.    No chief complaint on file.    History of Present Illness:    Vickie Burns is a  82 y.o. female.  Patient has past medical history of atrial fibrillation, coronary arteriosclerosis, essential hypertension, mixed dyslipidemia and diabetes mellitus.  She denies any problems at this time and takes care of activities of daily living.  No chest pain orthopnea or PND.  At the time of my evaluation, the patient is alert awake oriented and in no distress.  She walks on a regular basis.  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   Essential hypertension 10/07/2016   Fall at home, initial encounter 04/05/2022   Family history of breast cancer    aunt   History of adenomatous polyp of colon 02/12/2021   HLD (hyperlipidemia) 08/17/2012   Hypercholesteremia 2000   Hypercholesterolemia 2000   Hypertension    history   Long term (current) use of anticoagulants 12/14/2021   Long term (current) use of insulin (HCC) 02/12/2021   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   New onset a-fib (HCC) 06/28/2020   Osteoporosis 09/06/2016   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   Persistent atrial fibrillation (HCC) 06/07/2021   Personal history of malignant neoplasm of breast 02/12/2021   Personal history of radiation therapy  Postmenopausal    took HRT 1999 - 2004   Preop cardiovascular exam 07/03/2020   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   Traumatic ecchymosis of right lower leg 04/05/2022   Unspecified atrial fibrillation (HCC) 07/03/2020    Past Surgical  History:  Procedure Laterality Date   BREAST BIOPSY     BREAST BIOPSY Left 10/29/2022   MM LT BREAST BX W LOC DEV 1ST LESION IMAGE BX SPEC STEREO GUIDE 10/29/2022 [facility]   BREAST EXCISIONAL BIOPSY Left 09/07/2020   BREAST LUMPECTOMY  04/2009   rt breast  estrogen +, Her 2 Nu negative   BREAST LUMPECTOMY WITH RADIOACTIVE SEED LOCALIZATION Left 09/07/2020   Procedure: RADIOCATIVE SEED GUIDED LEFT BREAST LUMPECTOMY;  Surgeon: Ovidio Kin, MD;  Location: [facility];  Service: General;  Laterality: Left;   COLONOSCOPY WITH PROPOFOL N/A 04/05/2014   Procedure: COLONOSCOPY WITH PROPOFOL;  Surgeon: Charolett Bumpers, MD;  Location: [facility];  Service: Endoscopy;  Laterality: N/A;   ORIF FOOT FRACTURE Right 04/20/2014   done in [place]   WISDOM TOOTH EXTRACTION      Current Medications: Current Meds  Medication Sig   apixaban (ELIQUIS) 5 MG TABS tablet TAKE 1 TABLET BY MOUTH TWICE  DAILY   Calcium Carbonate-Vitamin D3 600-400 MG-UNIT TABS Take 1 tablet by mouth 2 (two) times daily.   digoxin (LANOXIN) 0.125 MG tablet Take 1 tablet (125 mcg total) by mouth daily.   ezetimibe (ZETIA) 10 MG tablet Take 10 mg by mouth every evening.   gabapentin (NEURONTIN) 600 MG tablet Take 600 mg by mouth 3 (three) times daily.   HUMALOG MIX 50/50 KWIKPEN (50-50) 100 UNIT/ML KwikPen Inject 10-16 Units into the skin as directed. Take 16 units in the morning, Take 10 units at lunch, Take 14 units in the evening   JARDIANCE 25 MG TABS tablet Take 25 mg by mouth daily.   metFORMIN (GLUCOPHAGE) 500 MG tablet Take 1,000 mg by mouth 2 (two) times daily.   rosuvastatin (CRESTOR) 40 MG tablet Take 40 mg by mouth every evening.   vitamin B-12 (CYANOCOBALAMIN) 1000 MCG tablet Take 1,000 mcg by mouth daily.   White Petrolatum-Mineral Oil (ARTIFICIAL EYE OP) Place 1 drop into both eyes in the morning and at bedtime.     Allergies:   Patient has no known allergies.   Social History    Socioeconomic History   Marital status: Widowed    Spouse name: Not on file   Number of children: Not on file   Years of education: Not on file   Highest education level: Not on file  Occupational History   Not on file  Tobacco Use   Smoking status: Former    Current packs/day: 0.00    Average packs/day: 0.3 packs/day for 30.0 years (7.5 ttl pk-yrs)    Types: Cigarettes    Start date: 03/30/1962    Quit date: 11/12/1991    Years since quitting: 31.7   Smokeless tobacco: Never  Vaping Use   Vaping status: Never Used  Substance and Sexual Activity   Alcohol use: No    Alcohol/week: 0.0 standard drinks of alcohol   Drug use: No   Sexual activity: Not Currently    Birth control/protection: Post-menopausal  Other Topics Concern   Not on file  Social History Narrative   2 sons, one adopted   Social Determinants of Health   Financial Resource Strain: Not on file  Food Insecurity: No Food Insecurity (02/21/2022)   [facility],  [facility]   Hunger Vital Sign    Worried About Running Out of Food in the Last Year: Never true    Ran Out of Food in the Last Year: Never true  Transportation Needs: Not on file  Physical Activity: Not on file  Stress: Not on file  Social Connections: Unknown (03/24/2022)   Received from [facility], [facility]   Social Network    Social Network: Not on file     Family History: The patient's family history includes Diabetes in her father; Heart disease in her father and mother; Heart failure in her father and mother; Obesity in her brother.  ROS:   Please see the history of present illness.    All other systems reviewed and are negative.  EKGs/Labs/Other Studies Reviewed:    The following studies were reviewed today: .Marland KitchenEKG Interpretation Date/Time:  Wednesday August 06 2023 10:28:18 EDT Ventricular Rate:  59 PR Interval:    QRS Duration:  78 QT Interval:  404 QTC Calculation: 399 R Axis:   61  Text  Interpretation: Atrial fibrillation with slow ventricular response ST & T wave abnormality, consider inferior ischemia When compared with ECG of 28-Jun-2020 15:03, T wave inversion now evident in Inferior leads Nonspecific T wave abnormality now evident in Lateral leads Confirmed by Belva Crome [phone]) on 08/06/2023 10:44:00 AM     Recent Labs: 10/22/2022: ALT 15; BUN 21; Creatinine 1.06; Hemoglobin 13.6; Platelet Count 215; Potassium 4.8; Sodium 141  Recent Lipid Panel    Component Value Date/Time   CHOL 115 06/14/2021 0821   TRIG 111 06/14/2021 0821   HDL 47 06/14/2021 0821   CHOLHDL 2.4 06/14/2021 0821   LDLCALC 48 06/14/2021 0821    Physical Exam:    VS:  BP 124/74   Pulse (!) 59   Ht 5\' 10"  (1.778 m)   Wt 186 lb 1.3 oz (84.4 kg)   LMP  (LMP Unknown)   SpO2 97%   BMI 26.70 kg/m     Wt Readings from Last 3 Encounters:  08/06/23 186 lb 1.3 oz (84.4 kg)  07/21/23 181 lb 2 oz (82.2 kg)  01/29/23 187 lb 0.6 oz (84.8 kg)     GEN: Patient is in no acute distress HEENT: Normal NECK: No JVD; No carotid bruits LYMPHATICS: No lymphadenopathy CARDIAC: Hear sounds regular, 2/6 systolic murmur at the apex. RESPIRATORY:  Clear to auscultation without rales, wheezing or rhonchi  ABDOMEN: Soft, non-tender, non-distended MUSCULOSKELETAL:  No edema; No deformity  SKIN: Warm and dry NEUROLOGIC:  Alert and oriented x 3 PSYCHIATRIC:  Normal affect   Signed, Garwin Brothers, MD  08/06/2023 10:45 AM    [facility]

## 2023-08-11 ENCOUNTER — Ambulatory Visit (HOSPITAL_BASED_OUTPATIENT_CLINIC_OR_DEPARTMENT_OTHER)
Admission: RE | Admit: 2023-08-11 | Discharge: 2023-08-11 | Disposition: A | Discharge status: Home / Self Care | Payer: Medicare Other | Source: Ambulatory Visit | Attending: Family Medicine | Admitting: Family Medicine

## 2023-08-11 DIAGNOSIS — E2839 Other primary ovarian failure: Secondary | ICD-10-CM | POA: Insufficient documentation

## 2023-08-12 ENCOUNTER — Ambulatory Visit (HOSPITAL_COMMUNITY)
Admission: RE | Admit: 2023-08-12 | Discharge: 2023-08-12 | Disposition: A | Discharge status: Home / Self Care | Payer: Medicare Other | Source: Ambulatory Visit | Attending: Cardiology | Admitting: Cardiology

## 2023-08-12 DIAGNOSIS — R600 Localized edema: Secondary | ICD-10-CM | POA: Diagnosis present

## 2023-09-19 ENCOUNTER — Telehealth: Payer: Self-pay | Admitting: Family Medicine

## 2023-09-19 NOTE — Telephone Encounter (Signed)
Pt called and stated that [facility] is needing a diagnostic for the patient to schedule her mammogram. Please call and advise.

## 2023-09-22 NOTE — Telephone Encounter (Signed)
Faxed the orders.

## 2023-09-23 ENCOUNTER — Encounter: Payer: Self-pay | Admitting: Family Medicine

## 2023-09-23 NOTE — Telephone Encounter (Signed)
Called pt was advised it was faxed.

## 2023-09-23 NOTE — Telephone Encounter (Signed)
Error

## 2023-09-23 NOTE — Telephone Encounter (Signed)
Pt called back and stated she was told orders was not sent in. Please call and advise.

## 2023-09-27 ENCOUNTER — Other Ambulatory Visit: Payer: Self-pay | Admitting: Cardiology

## 2023-09-27 DIAGNOSIS — I480 Paroxysmal atrial fibrillation: Secondary | ICD-10-CM

## 2023-09-29 NOTE — Telephone Encounter (Signed)
Prescription refill request for Eliquis received. Indication:afib Last office visit:9/24 Scr:1.06  12/23 Age: 82 Weight:84.4  kg  Prescription refilled

## 2023-10-01 ENCOUNTER — Telehealth: Payer: Self-pay | Admitting: Family Medicine

## 2023-10-01 ENCOUNTER — Other Ambulatory Visit: Payer: Self-pay

## 2023-10-01 DIAGNOSIS — Z1239 Encounter for other screening for malignant neoplasm of breast: Secondary | ICD-10-CM

## 2023-10-01 DIAGNOSIS — Z853 Personal history of malignant neoplasm of breast: Secondary | ICD-10-CM

## 2023-10-01 DIAGNOSIS — R0789 Other chest pain: Secondary | ICD-10-CM

## 2023-10-01 NOTE — Telephone Encounter (Signed)
Patient called and states office would like [place] to fax mammo order to [phone].

## 2023-10-01 NOTE — Telephone Encounter (Signed)
 New orders has been placed

## 2023-10-02 ENCOUNTER — Other Ambulatory Visit: Payer: Self-pay | Admitting: Family Medicine

## 2023-10-02 DIAGNOSIS — Z1239 Encounter for other screening for malignant neoplasm of breast: Secondary | ICD-10-CM

## 2023-10-02 NOTE — Telephone Encounter (Signed)
[facility] and they need orders resent. Stated it needs to be orders for a diagnostic bilateral mammo and [place].

## 2023-10-03 NOTE — Telephone Encounter (Signed)
Spoke with provider and pt advised her insurance may not pay for both BILATERAL DIAGNOSTIC  MAMMOGRAM AND ULTRASOUND. Pt ok to start with Mammogram and it was sent ,

## 2023-10-03 NOTE — Addendum Note (Signed)
Addended by: Kathi Ludwig on: 10/03/2023 01:16 PM   Modules accepted: Orders

## 2023-10-03 NOTE — Telephone Encounter (Signed)
Pt calling back to figure out what is happening with the mammogram order as she has been calling for the last couple of weeks regarding this. The current order is for a screening mammo. Pt needs a :  BILATERAL DIAGNOSTIC MAMMOGRAM AND ULTRASOUND   Please call patient to advise when correct mammogram order has been sent to fax #[phone]. Pt said maybe calling the breast imaging center would help resolve the issue. Their number [phone].

## 2023-10-23 ENCOUNTER — Encounter: Payer: Self-pay | Admitting: Hematology & Oncology

## 2023-10-23 ENCOUNTER — Inpatient Hospital Stay: Payer: Medicare Other | Attending: Hematology & Oncology

## 2023-10-23 ENCOUNTER — Inpatient Hospital Stay: Payer: Medicare Other

## 2023-10-23 ENCOUNTER — Other Ambulatory Visit: Payer: Self-pay

## 2023-10-23 ENCOUNTER — Inpatient Hospital Stay: Payer: Medicare Other | Admitting: Hematology & Oncology

## 2023-10-23 VITALS — BP 126/73 | HR 84 | Temp 98.7°F | Resp 20 | Ht 70.0 in | Wt 185.0 lb

## 2023-10-23 VITALS — BP 138/73 | HR 72

## 2023-10-23 DIAGNOSIS — M810 Age-related osteoporosis without current pathological fracture: Secondary | ICD-10-CM | POA: Insufficient documentation

## 2023-10-23 DIAGNOSIS — Z853 Personal history of malignant neoplasm of breast: Secondary | ICD-10-CM | POA: Insufficient documentation

## 2023-10-23 DIAGNOSIS — C50011 Malignant neoplasm of nipple and areola, right female breast: Secondary | ICD-10-CM

## 2023-10-23 DIAGNOSIS — M8000XD Age-related osteoporosis with current pathological fracture, unspecified site, subsequent encounter for fracture with routine healing: Secondary | ICD-10-CM

## 2023-10-23 LAB — CBC WITH DIFFERENTIAL (CANCER CENTER ONLY)
Abs Immature Granulocytes: 0.03 10*3/uL (ref 0.00–0.07)
Basophils Absolute: 0.1 10*3/uL (ref 0.0–0.1)
Basophils Relative: 1 %
Eosinophils Absolute: 0.2 10*3/uL (ref 0.0–0.5)
Eosinophils Relative: 3 %
HCT: 41.9 % (ref 36.0–46.0)
Hemoglobin: 13.2 g/dL (ref 12.0–15.0)
Immature Granulocytes: 0 %
Lymphocytes Relative: 12 %
Lymphs Abs: 1 10*3/uL (ref 0.7–4.0)
MCH: 28.1 pg (ref 26.0–34.0)
MCHC: 31.5 g/dL (ref 30.0–36.0)
MCV: 89.3 fL (ref 80.0–100.0)
Monocytes Absolute: 0.9 10*3/uL (ref 0.1–1.0)
Monocytes Relative: 11 %
Neutro Abs: 6.2 10*3/uL (ref 1.7–7.7)
Neutrophils Relative %: 73 %
Platelet Count: 228 10*3/uL (ref 150–400)
RBC: 4.69 MIL/uL (ref 3.87–5.11)
RDW: 15.7 % — ABNORMAL HIGH (ref 11.5–15.5)
WBC Count: 8.3 10*3/uL (ref 4.0–10.5)
nRBC: 0 % (ref 0.0–0.2)

## 2023-10-23 LAB — CMP (CANCER CENTER ONLY)
ALT: 10 U/L (ref 0–44)
AST: 15 U/L (ref 15–41)
Albumin: 4 g/dL (ref 3.5–5.0)
Alkaline Phosphatase: 45 U/L (ref 38–126)
Anion gap: 8 (ref 5–15)
BUN: 28 mg/dL — ABNORMAL HIGH (ref 8–23)
CO2: 27 mmol/L (ref 22–32)
Calcium: 10.2 mg/dL (ref 8.9–10.3)
Chloride: 107 mmol/L (ref 98–111)
Creatinine: 1.09 mg/dL — ABNORMAL HIGH (ref 0.44–1.00)
GFR, Estimated: 51 mL/min — ABNORMAL LOW (ref >60)
Glucose, Bld: 163 mg/dL — ABNORMAL HIGH (ref 70–99)
Potassium: 5.3 mmol/L — ABNORMAL HIGH (ref 3.5–5.1)
Sodium: 142 mmol/L (ref 135–145)
Total Bilirubin: 0.5 mg/dL (ref <1.2)
Total Protein: 6.5 g/dL (ref 6.5–8.1)

## 2023-10-23 LAB — LACTATE DEHYDROGENASE: LDH: 133 U/L (ref 98–192)

## 2023-10-23 MED ORDER — SODIUM CHLORIDE 0.9 % IV SOLN: INTRAVENOUS | Status: DC

## 2023-10-23 MED ORDER — ZOLEDRONIC ACID 4 MG/100ML IV SOLN
4.0000 mg | Freq: Once | INTRAVENOUS | Status: AC
  Administered 2023-10-23: 4 mg via INTRAVENOUS
  Filled 2023-10-23: qty 100

## 2023-10-23 NOTE — Progress Notes (Signed)
Hematology and Oncology Follow Up Visit  Vickie Burns [id number] 1941/01/23 82 y.o. 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  She is doing okay.  We saw her a year ago.  She has had a breast biopsy of the left breast.  This was done on 10/29/2022.  The pathology report ([id number]) only showed sclerosing and fibrosis.  I think she is due for another mammogram.  We will have to get this set up.  She does have diabetes.  She is on insulin.  She is on Eliquis.  She has had no bleeding with the Eliquis.  Otherwise, she is doing okay.  She has had no issues.  Unfortunate, her daughter-in-law had a stroke.  She is finally recovering from this.  There has been no problems with fever.  She has had no cough or shortness of breath.  She has had no rashes.  There has been a little bit of leg swelling which is chronic.  She has had no headache.  Overall, I would say that her performance status is ECOG 1.    Medications:  Allergies as of 10/23/2023   No Known Allergies      Medication List        Accurate as of October 23, 2023 10:01 AM. If you have any questions, ask your nurse or doctor.          STOP taking these medications    Fluad 0.5 ML injection Generic drug: influenza vaccine adjuvanted Stopped by: Josph Macho       TAKE these medications    ARTIFICIAL EYE OP Place 1 drop into both eyes in the morning and at bedtime.   Calcium Carbonate-Vitamin D3 600-400 MG-UNIT Tabs Take 1 tablet by mouth 2 (two) times daily.   cyanocobalamin 1000 MCG tablet Commonly known as: VITAMIN B12 Take 1,000 mcg by mouth daily.   digoxin 0.125 MG tablet Commonly known as: LANOXIN Take 1 tablet (125 mcg total) by mouth daily.   Eliquis 5 MG  Tabs tablet Generic drug: apixaban TAKE 1 TABLET BY MOUTH TWICE  DAILY   ezetimibe 10 MG tablet Commonly known as: ZETIA Take 10 mg by mouth every evening.   gabapentin 600 MG tablet Commonly known as: NEURONTIN Take 600 mg by mouth 3 (three) times daily.   HumaLOG Mix 50/50 KwikPen (50-50) 100 UNIT/ML KwikPen Generic drug: Insulin Lispro Prot & Lispro Inject 10-16 Units into the skin as directed. 10/23/2023 Takes 12 units in the morning, Take 112-14 units at lunch, Take 18 units in the evening. Based on blood sugar.   Jardiance 25 MG Tabs tablet Generic drug: empagliflozin Take 25 mg by mouth daily.   metFORMIN 500 MG tablet Commonly known as: GLUCOPHAGE Take 1,000 mg by mouth 2 (two) times daily.   Novofine Pen Needle 32G X 6 MM Misc Generic drug: Insulin Pen Needle Inject into the skin.   PreserVision AREDS 2 Caps Take by mouth 2 (two) times daily.   rosuvastatin 40 MG tablet Commonly known as: CRESTOR Take 40 mg by mouth every evening.        Allergies: No Known Allergies  Past Medical History, Surgical history, Social history, and Family History were reviewed and updated.  Review of Systems: Review of  Systems  Constitutional: Negative.   HENT: Negative.    Eyes: Negative.   Respiratory: Negative.    Cardiovascular: Negative.   Gastrointestinal: Negative.   Genitourinary: Negative.   Musculoskeletal: Negative.   Skin: Negative.   Neurological: Negative.   Endo/Heme/Allergies:  Bruises/bleeds easily.  Psychiatric/Behavioral: Negative.     Marland Kitchen   Physical Exam:  height is 5\' 10"  (1.778 m) and weight is 185 lb (83.9 kg). Her oral temperature is 98.7 F (37.1 C). Her blood pressure is 126/73 and her pulse is 84. Her respiration is 20 and oxygen saturation is 100%.   Wt Readings from Last 3 Encounters:  10/23/23 185 lb (83.9 kg)  08/06/23 186 lb 1.3 oz (84.4 kg)  07/21/23 181 lb 2 oz (82.2 kg)    Physical Exam Vitals reviewed.  Constitutional:       Comments: Her breast exam shows the right breast be slightly contracted from surgery and radiation.  She has the lumpectomy scar at the edge of the areola at about the 11 o'clock position.  There is no mass in the right breast.  There is no right axillary adenopathy.  Left breast shows the lumpectomy scar at the edge of the areola at about the 1 o'clock position.  There is no mass, edema or erythema in the left breast.  There is no left axillary adenopathy.  HENT:     Head: Normocephalic and atraumatic.  Eyes:     Pupils: Pupils are equal, round, and reactive to light.  Cardiovascular:     Rate and Rhythm: Normal rate and regular rhythm.     Heart sounds: Normal heart sounds.  Pulmonary:     Effort: Pulmonary effort is normal.     Breath sounds: Normal breath sounds.  Abdominal:     General: Bowel sounds are normal.     Palpations: Abdomen is soft.  Musculoskeletal:        General: No tenderness or deformity. Normal range of motion.     Cervical back: Normal range of motion.     Comments: Extremities shows a substantial ecchymoses in the left lower leg.  This is below the knee.  There is no obvious hematoma.  She has good circulation in her distal extremities.  Lymphadenopathy:     Cervical: No cervical adenopathy.  Skin:    General: Skin is warm and dry.     Findings: No erythema or rash.  Neurological:     Mental Status: She is alert and oriented to person, place, and time.  Psychiatric:        Behavior: Behavior normal.        Thought Content: Thought content normal.        Judgment: Judgment normal.      Lab Results  Component Value Date   WBC 8.3 10/23/2023   HGB 13.2 10/23/2023   HCT 41.9 10/23/2023   MCV 89.3 10/23/2023   PLT 228 10/23/2023   No results found for: "FERRITIN", "IRON", "TIBC", "UIBC", "IRONPCTSAT" Lab Results  Component Value Date   RBC 4.69 10/23/2023   No results found for: "KPAFRELGTCHN", "LAMBDASER", "KAPLAMBRATIO" No results found for:  "IGGSERUM", "IGA", "IGMSERUM" No results found for: "TOTALPROTELP", "ALBUMINELP", "A1GS", "A2GS", "BETS", "BETA2SER", "GAMS", "MSPIKE", "SPEI"   Chemistry      Component Value Date/Time   NA 141 10/22/2022 0907   NA 139 10/12/2021 1135   NA 144 09/26/2017 1143   NA 138 09/29/2015 1410   K 4.8 10/22/2022 0907   K 4.3 09/26/2017  1143   K 4.5 09/29/2015 1410   CL 104 10/22/2022 0907   CL 106 09/26/2017 1143   CO2 31 10/22/2022 0907   CO2 27 09/26/2017 1143   CO2 27 09/29/2015 1410   BUN 21 10/22/2022 0907   BUN 21 10/12/2021 1135   BUN 11 09/26/2017 1143   BUN 17.4 09/29/2015 1410   CREATININE 1.06 (H) 10/22/2022 0907   CREATININE 1.1 09/26/2017 1143   CREATININE 1.0 09/29/2015 1410      Component Value Date/Time   CALCIUM 10.5 (H) 10/22/2022 0907   CALCIUM 10.0 09/26/2017 1143   CALCIUM 9.5 09/29/2015 1410   ALKPHOS 45 10/22/2022 0907   ALKPHOS 48 09/26/2017 1143   ALKPHOS 51 09/29/2015 1410   AST 22 10/22/2022 0907   AST 18 09/29/2015 1410   ALT 15 10/22/2022 0907   ALT 23 09/26/2017 1143   ALT 15 09/29/2015 1410   BILITOT 0.5 10/22/2022 0907   BILITOT 0.37 09/29/2015 1410      Impression and Plan: Ms. Shiflett is a very pleasant 82 yo caucasian female with history of stage I infiltrating ductal carcinoma of the right breast.    She was treated with lumpectomy and radiation.   She completed 5 years of Femara in October 2015. She continues to do well and so far there has been no evidence of recurrence.   We will proceed with Zometa today as planned.   I am glad that the biopsy came back negative.  We will see about a another mammogram for her.  Will plan to get her back in 1 more year.  She was gets Zometa when we see her.    Josph Macho, MD 12/12/202410:01 AM

## 2023-10-23 NOTE — Patient Instructions (Signed)
[facility] - A DEPT OF [facility]  Discharge Instructions: Thank you for choosing [facility] to provide your oncology and hematology care.   If you have a lab appointment with the Cancer Center, please go directly to the Cancer Center and check in at the registration area.  Wear comfortable clothing and clothing appropriate for easy access to any Portacath or PICC line.   We strive to give you quality time with your provider. You may need to reschedule your appointment if you arrive late (15 or more minutes).  Arriving late affects you and other patients whose appointments are after yours.  Also, if you miss three or more appointments without notifying the office, you may be dismissed from the clinic at the provider's discretion.      For prescription refill requests, have your pharmacy contact our office and allow 72 hours for refills to be completed.    Today you received the following chemotherapy and/or immunotherapy agents Zometa      To help prevent nausea and vomiting after your treatment, we encourage you to take your nausea medication as directed.  BELOW ARE SYMPTOMS THAT SHOULD BE REPORTED IMMEDIATELY: *FEVER GREATER THAN 100.4 F (38 C) OR HIGHER *CHILLS OR SWEATING *NAUSEA AND VOMITING THAT IS NOT CONTROLLED WITH YOUR NAUSEA MEDICATION *UNUSUAL SHORTNESS OF BREATH *UNUSUAL BRUISING OR BLEEDING *URINARY PROBLEMS (pain or burning when urinating, or frequent urination) *BOWEL PROBLEMS (unusual diarrhea, constipation, pain near the anus) TENDERNESS IN MOUTH AND THROAT WITH OR WITHOUT PRESENCE OF ULCERS (sore throat, sores in mouth, or a toothache) UNUSUAL RASH, SWELLING OR PAIN  UNUSUAL VAGINAL DISCHARGE OR ITCHING   Items with * indicate a potential emergency and should be followed up as soon as possible or go to the Emergency Department if any problems should occur.  Please show the CHEMOTHERAPY ALERT CARD or IMMUNOTHERAPY ALERT  CARD at check-in to the Emergency Department and triage nurse. Should you have questions after your visit or need to cancel or reschedule your appointment, please contact [facility] - A DEPT OF [facility]  [phone] and follow the prompts.  Office hours are 8:00 a.m. to 4:30 p.m. Monday - Friday. Please note that voicemails left after 4:00 p.m. may not be returned until the following business day.  We are closed weekends and major holidays. You have access to a nurse at all times for urgent questions. Please call the main number to the clinic [phone] and follow the prompts.  For any non-urgent questions, you may also contact your provider using MyChart. We now offer e-Visits for anyone 26 and older to request care online for non-urgent symptoms. For details visit [email or web address].   Also download the MyChart app! Go to the app store, search "MyChart", open the app, select , and log in with your MyChart username and password.

## 2023-11-13 ENCOUNTER — Other Ambulatory Visit: Payer: Self-pay | Admitting: *Deleted

## 2023-11-13 DIAGNOSIS — C50011 Malignant neoplasm of nipple and areola, right female breast: Secondary | ICD-10-CM

## 2023-11-13 DIAGNOSIS — M8000XD Age-related osteoporosis with current pathological fracture, unspecified site, subsequent encounter for fracture with routine healing: Secondary | ICD-10-CM

## 2023-11-20 ENCOUNTER — Other Ambulatory Visit: Payer: Self-pay | Admitting: Hematology & Oncology

## 2023-11-20 ENCOUNTER — Ambulatory Visit
Admission: RE | Admit: 2023-11-20 | Discharge: 2023-11-20 | Disposition: A | Discharge status: Home / Self Care | Payer: Medicare Other | Source: Ambulatory Visit | Attending: Hematology & Oncology | Admitting: Hematology & Oncology

## 2023-11-20 DIAGNOSIS — C50011 Malignant neoplasm of nipple and areola, right female breast: Secondary | ICD-10-CM

## 2023-11-20 DIAGNOSIS — M8000XD Age-related osteoporosis with current pathological fracture, unspecified site, subsequent encounter for fracture with routine healing: Secondary | ICD-10-CM

## 2023-11-20 DIAGNOSIS — C50012 Malignant neoplasm of nipple and areola, left female breast: Secondary | ICD-10-CM

## 2023-11-20 DIAGNOSIS — N644 Mastodynia: Secondary | ICD-10-CM

## 2023-12-04 ENCOUNTER — Ambulatory Visit (INDEPENDENT_AMBULATORY_CARE_PROVIDER_SITE_OTHER): Payer: Medicare Other | Admitting: Obstetrics and Gynecology

## 2023-12-04 ENCOUNTER — Encounter: Payer: Self-pay | Admitting: Obstetrics and Gynecology

## 2023-12-04 VITALS — BP 102/68 | HR 86 | Temp 98.0°F | Ht 68.0 in | Wt 180.0 lb

## 2023-12-04 DIAGNOSIS — Z853 Personal history of malignant neoplasm of breast: Secondary | ICD-10-CM

## 2023-12-04 DIAGNOSIS — Z01419 Encounter for gynecological examination (general) (routine) without abnormal findings: Secondary | ICD-10-CM | POA: Insufficient documentation

## 2023-12-04 DIAGNOSIS — M85832 Other specified disorders of bone density and structure, left forearm: Secondary | ICD-10-CM

## 2023-12-04 NOTE — Patient Instructions (Signed)
For patients under 50-81yo, I recommend 1200mg  calcium daily and 600IU of vitamin D daily. For patients over 81yo, I recommend 1200mg  calcium daily and 800IU of vitamin D daily.  Health Maintenance, Female Adopting a healthy lifestyle and getting preventive care are important in promoting health and wellness. Ask your health care provider about: The right schedule for you to have regular tests and exams. Things you can do on your own to prevent diseases and keep yourself healthy. What should I know about diet, weight, and exercise? Eat a healthy diet  Eat a diet that includes plenty of vegetables, fruits, low-fat dairy products, and lean protein. Do not eat a lot of foods that are high in solid fats, added sugars, or sodium. Maintain a healthy weight Body mass index (BMI) is used to identify weight problems. It estimates body fat based on height and weight. Your health care provider can help determine your BMI and help you achieve or maintain a healthy weight. Get regular exercise Get regular exercise. This is one of the most important things you can do for your health. Most adults should: Exercise for at least 150 minutes each week. The exercise should increase your heart rate and make you sweat (moderate-intensity exercise). Do strengthening exercises at least twice a week. This is in addition to the moderate-intensity exercise. Spend less time sitting. Even light physical activity can be beneficial. Watch cholesterol and blood lipids Have your blood tested for lipids and cholesterol at 81 years of age, then have this test every 5 years. Have your cholesterol levels checked more often if: Your lipid or cholesterol levels are high. You are older than 81 years of age. You are at high risk for heart disease. What should I know about cancer screening? Depending on your health history and family history, you may need to have cancer screening at various ages. This may include screening  for: Breast cancer. Cervical cancer. Colorectal cancer. Skin cancer. Lung cancer. What should I know about heart disease, diabetes, and high blood pressure? Blood pressure and heart disease High blood pressure causes heart disease and increases the risk of stroke. This is more likely to develop in people who have high blood pressure readings or are overweight. Have your blood pressure checked: Every 3-5 years if you are 83-81 years of age. Every year if you are 4 years old or older. Diabetes Have regular diabetes screenings. This checks your fasting blood sugar level. Have the screening done: Once every three years after age 68 if you are at a normal weight and have a low risk for diabetes. More often and at a younger age if you are overweight or have a high risk for diabetes. What should I know about preventing infection? Hepatitis B If you have a higher risk for hepatitis B, you should be screened for this virus. Talk with your health care provider to find out if you are at risk for hepatitis B infection. Hepatitis C Testing is recommended for: Everyone born from [birth year] through 1963. Anyone with known risk factors for hepatitis C. Sexually transmitted infections (STIs) Get screened for STIs, including gonorrhea and chlamydia, if: You are sexually active and are younger than 81 years of age. You are older than 81 years of age and your health care provider tells you that you are at risk for this type of infection. Your sexual activity has changed since you were last screened, and you are at increased risk for chlamydia or gonorrhea. Ask your health care provider if  you are at risk. Ask your health care provider about whether you are at high risk for HIV. Your health care provider may recommend a prescription medicine to help prevent HIV infection. If you choose to take medicine to prevent HIV, you should first get tested for HIV. You should then be tested every 3 months for as long as you  are taking the medicine. Osteoporosis and menopause Osteoporosis is a disease in which the bones lose minerals and strength with aging. This can result in bone fractures. If you are 44 years old or older, or if you are at risk for osteoporosis and fractures, ask your health care provider if you should: Be screened for bone loss. Take a calcium or vitamin D supplement to lower your risk of fractures. Be given hormone replacement therapy (HRT) to treat symptoms of menopause. Follow these instructions at home: Alcohol use Do not drink alcohol if: Your health care provider tells you not to drink. You are pregnant, may be pregnant, or are planning to become pregnant. If you drink alcohol: Limit how much you have to: 0-1 drink a day. Know how much alcohol is in your drink. In the [place] one drink equals one 12 oz bottle of beer (355 mL), one 5 oz glass of wine (148 mL), or one 1 oz glass of hard liquor (44 mL). Lifestyle Do not use any products that contain nicotine or tobacco. These products include cigarettes, chewing tobacco, and vaping devices, such as e-cigarettes. If you need help quitting, ask your health care provider. Do not use street drugs. Do not share needles. Ask your health care provider for help if you need support or information about quitting drugs. General instructions Schedule regular health, dental, and eye exams. Stay current with your vaccines. Tell your health care provider if: You often feel depressed. You have ever been abused or do not feel safe at home. Summary Adopting a healthy lifestyle and getting preventive care are important in promoting health and wellness. Follow your health care provider's instructions about healthy diet, exercising, and getting tested or screened for diseases. Follow your health care provider's instructions on monitoring your cholesterol and blood pressure. This information is not intended to replace advice given to you by your health  care provider. Make sure you discuss any questions you have with your health care provider. Document Revised: 03/19/2021 Document Reviewed: 03/19/2021 Elsevier Patient Education  2024 [employer].

## 2023-12-04 NOTE — Progress Notes (Signed)
83 y.o. G1P1001 postmenopausal female with H/O right breast cancer (s/p lumpectomy and radiation. Completed Femara in 2015), osteopenia (on alendronate managed by oncologist) here for annual exam. Widowed.  Retired from [employer] in [place].  Children live in [place].  Just completed yearly zoledronate with hematology. Benign breast biopsy in 2023  Postmenopausal bleeding: none Pelvic discharge or pain: None Breast mass, nipple discharge or skin changes : None Last PAP: No results found for: "DIAGPAP", "HPVHIGH", "ADEQPAP" Last mammogram: 11/20/2023 BI-RADS 2, density C Last colonoscopy: 03/01/2014 Last DXA: 07/15/2023 left forearm T-score -1.8 Sexually active: no  Exercising: no  GYN HISTORY: History of breast cancer  OB History  Gravida Para Term Preterm AB Living  1 1 1  0 0 1  SAB IAB Ectopic Multiple Live Births  0 0 0 0 1    # Outcome Date GA Lbr Len/2nd Weight Sex Type Anes PTL Lv  1 Term 70    M Vag-Spont   LIV    Obstetric Comments  Pt has adopted son, 02/1970    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   Essential hypertension 10/07/2016   Fall at home, initial encounter 04/05/2022   Family history of breast cancer    aunt   History of adenomatous polyp of colon 02/12/2021   HLD (hyperlipidemia) 08/17/2012   Hypercholesteremia 2000   Hypercholesterolemia 2000   Hypertension    history   Long term (current) use of  anticoagulants 12/14/2021   Long term (current) use of insulin (HCC) 02/12/2021   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   New onset a-fib (HCC) 06/28/2020   Osteoporosis 09/06/2016   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   Persistent atrial fibrillation (HCC) 06/07/2021   Personal history of malignant neoplasm of breast 02/12/2021   Personal history of radiation therapy    Postmenopausal    took HRT 1999 - 2004   Preop cardiovascular exam 07/03/2020   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   Traumatic ecchymosis of right lower leg 04/05/2022   Unspecified atrial fibrillation (HCC) 07/03/2020    Past Surgical History:  Procedure Laterality Date   BREAST BIOPSY     BREAST BIOPSY Left 10/29/2022   MM LT BREAST BX W LOC DEV 1ST LESION IMAGE BX SPEC STEREO GUIDE 10/29/2022 [facility]   BREAST EXCISIONAL BIOPSY Left 09/07/2020   BREAST LUMPECTOMY  04/2009   rt breast  estrogen +, Her 2 Nu negative   BREAST LUMPECTOMY WITH RADIOACTIVE SEED LOCALIZATION Left 09/07/2020   Procedure: RADIOCATIVE SEED GUIDED LEFT BREAST LUMPECTOMY;  Surgeon: Ovidio Kin, MD;  Location: [facility];  Service: General;  Laterality: Left;   COLONOSCOPY WITH PROPOFOL N/A 04/05/2014   Procedure: COLONOSCOPY WITH PROPOFOL;  Surgeon: Charolett Bumpers, MD;  Location: [facility];  Service: Endoscopy;  Laterality: N/A;   ORIF FOOT FRACTURE Right 04/20/2014   done in [place]   WISDOM TOOTH EXTRACTION      Current Outpatient Medications on File Prior to Visit  Medication  Sig Dispense Refill   Calcium Carbonate-Vitamin D3 600-400 MG-UNIT TABS Take 1 tablet by mouth 2 (two) times daily.     ELIQUIS 5 MG TABS tablet TAKE 1 TABLET BY MOUTH TWICE  DAILY 180 tablet 3   ezetimibe (ZETIA) 10 MG tablet Take 10 mg by mouth every evening.     gabapentin (NEURONTIN) 600 MG tablet Take 600 mg by mouth 3 (three) times daily.     HUMALOG MIX 50/50 KWIKPEN (50-50) 100 UNIT/ML KwikPen Inject 10-16 Units  into the skin as directed. 10/23/2023 Takes 12 units in the morning, Take 112-14 units at lunch, Take 18 units in the evening. Based on blood sugar.     JARDIANCE 25 MG TABS tablet Take 25 mg by mouth daily.     metFORMIN (GLUCOPHAGE) 500 MG tablet Take 1,000 mg by mouth 2 (two) times daily.     Multiple Vitamins-Minerals (PRESERVISION AREDS 2) CAPS Take by mouth 2 (two) times daily.     NOVOFINE PEN NEEDLE 32G X 6 MM MISC Inject into the skin.     rosuvastatin (CRESTOR) 40 MG tablet Take 40 mg by mouth every evening.     vitamin B-12 (CYANOCOBALAMIN) 1000 MCG tablet Take 1,000 mcg by mouth daily.     White Petrolatum-Mineral Oil (ARTIFICIAL EYE OP) Place 1 drop into both eyes in the morning and at bedtime.     No current facility-administered medications on file prior to visit.    Social History   Socioeconomic History   Marital status: Widowed    Spouse name: Not on file   Number of children: Not on file   Years of education: Not on file   Highest education level: Not on file  Occupational History   Not on file  Tobacco Use   Smoking status: Former    Current packs/day: 0.00    Average packs/day: 0.3 packs/day for 30.0 years (7.5 ttl pk-yrs)    Types: Cigarettes    Start date: 03/30/1962    Quit date: 11/12/1991    Years since quitting: 32.0   Smokeless tobacco: Never  Vaping Use   Vaping status: Never Used  Substance and Sexual Activity   Alcohol use: No    Alcohol/week: 0.0 standard drinks of alcohol   Drug use: No   Sexual activity: Not Currently    Birth control/protection: Post-menopausal  Other Topics Concern   Not on file  Social History Narrative   2 sons, one adopted   Social Drivers of Corporate investment banker Strain: Not on file  Food Insecurity: No Food Insecurity (02/21/2022)   Received from [facility], [facility]   Hunger Vital Sign    Worried About Running Out of Food in the Last Year: Never true    Ran Out of Food in the Last Year: Never true   Transportation Needs: Not on file  Physical Activity: Not on file  Stress: Not on file  Social Connections: Unknown (03/24/2022)   [facility], [facility]   Social Network    Social Network: Not on file  Intimate Partner Violence: Unknown (02/13/2022)   Received from [facility], [facility]   HITS    Physically Hurt: Not on file    Insult or Talk Down To: Not on file    Threaten Physical Harm: Not on file    Scream or Curse: Not on file    Family History  Problem Relation Age of Onset   Heart disease Mother  Confestive Heart Failure   Heart failure Mother    Diabetes Father    Heart disease Father        Congestive Heart Failure   Heart failure Father    Obesity Brother     No Known Allergies    PE Today's Vitals   12/04/23 1124  BP: 102/68  Pulse: 86  Temp: 98 F (36.7 C)  TempSrc: Oral  SpO2: 98%  Weight: 180 lb (81.6 kg)  Height: 5\' 8"  (1.727 m)   Body mass index is 27.37 kg/m.  Physical Exam Vitals reviewed. Exam conducted with a chaperone present.  Constitutional:      General: She is not in acute distress.    Appearance: Normal appearance.  HENT:     Head: Normocephalic and atraumatic.     Nose: Nose normal.  Eyes:     Extraocular Movements: Extraocular movements intact.     Conjunctiva/sclera: Conjunctivae normal.  Neck:     Thyroid: No thyroid mass, thyromegaly or thyroid tenderness.  Pulmonary:     Effort: Pulmonary effort is normal.  Chest:     Chest wall: No mass or tenderness.  Breasts:    Right: Normal. No swelling, mass, nipple discharge, skin change or tenderness.     Left: Normal. No swelling, mass, nipple discharge, skin change or tenderness.     Comments: Bilateral lumpectomy scars Abdominal:     General: There is no distension.     Palpations: Abdomen is soft.     Tenderness: There is no abdominal tenderness.  Genitourinary:    General: Normal vulva.     Exam position: Lithotomy position.      Urethra: No prolapse.     Vagina: Normal. No vaginal discharge or bleeding.     Cervix: Normal. No lesion.     Uterus: Normal. Not enlarged and not tender.      Adnexa: Right adnexa normal and left adnexa normal.  Musculoskeletal:        General: Normal range of motion.     Cervical back: Normal range of motion.  Lymphadenopathy:     Upper Body:     Right upper body: No axillary adenopathy.     Left upper body: No axillary adenopathy.     Lower Body: No right inguinal adenopathy. No left inguinal adenopathy.  Skin:    General: Skin is warm and dry.  Neurological:     General: No focal deficit present.     Mental Status: She is alert.  Psychiatric:        Mood and Affect: Mood normal.        Behavior: Behavior normal.       Assessment and Plan:        Well woman exam with routine gynecological exam Assessment & Plan: Cervical cancer screening discontinued due to patient age and screening hx. Encouraged annual mammogram screening Colonoscopy, due this year DXA UTD, osteopenia on zolendronate Labs and immunizations with her primary Encouraged safe sexual practices as indicated Encouraged healthy lifestyle practices with diet and exercise For patients over 70yo, I recommend 1200mg  calcium daily and 800IU of vitamin D daily.    History of breast cancer Osteopenia of left forearm Continue management and care with oncologist  Rosalyn Gess, MD

## 2023-12-08 ENCOUNTER — Other Ambulatory Visit: Payer: Self-pay | Admitting: Cardiology

## 2023-12-09 ENCOUNTER — Encounter: Payer: Self-pay | Admitting: Obstetrics and Gynecology

## 2023-12-09 DIAGNOSIS — M85832 Other specified disorders of bone density and structure, left forearm: Secondary | ICD-10-CM | POA: Insufficient documentation

## 2023-12-09 NOTE — Assessment & Plan Note (Signed)
Cervical cancer screening discontinued due to patient age and screening hx. Encouraged annual mammogram screening Colonoscopy, due this year DXA UTD Labs and immunizations with her primary Encouraged safe sexual practices as indicated Encouraged healthy lifestyle practices with diet and exercise For patients over 81yo, I recommend 1200mg  calcium daily and 800IU of vitamin D daily.

## 2023-12-09 NOTE — Assessment & Plan Note (Addendum)
Infusions managed by heme/onc

## 2024-02-26 DIAGNOSIS — H2512 Age-related nuclear cataract, left eye: Secondary | ICD-10-CM | POA: Insufficient documentation

## 2024-03-17 ENCOUNTER — Other Ambulatory Visit

## 2024-03-22 ENCOUNTER — Ambulatory Visit (INDEPENDENT_AMBULATORY_CARE_PROVIDER_SITE_OTHER): Admitting: Podiatry

## 2024-03-22 DIAGNOSIS — Z91199 Patient's noncompliance with other medical treatment and regimen due to unspecified reason: Secondary | ICD-10-CM

## 2024-03-22 NOTE — Progress Notes (Signed)
 Cancel 24 hours-weather

## 2024-03-23 ENCOUNTER — Ambulatory Visit

## 2024-04-07 ENCOUNTER — Encounter: Payer: Self-pay | Admitting: Podiatry

## 2024-04-07 ENCOUNTER — Ambulatory Visit (INDEPENDENT_AMBULATORY_CARE_PROVIDER_SITE_OTHER): Admitting: Podiatry

## 2024-04-07 DIAGNOSIS — M79674 Pain in right toe(s): Secondary | ICD-10-CM | POA: Diagnosis not present

## 2024-04-07 DIAGNOSIS — E1142 Type 2 diabetes mellitus with diabetic polyneuropathy: Secondary | ICD-10-CM | POA: Diagnosis not present

## 2024-04-07 DIAGNOSIS — B351 Tinea unguium: Secondary | ICD-10-CM

## 2024-04-07 DIAGNOSIS — M2041 Other hammer toe(s) (acquired), right foot: Secondary | ICD-10-CM

## 2024-04-07 DIAGNOSIS — M2042 Other hammer toe(s) (acquired), left foot: Secondary | ICD-10-CM

## 2024-04-07 DIAGNOSIS — M79675 Pain in left toe(s): Secondary | ICD-10-CM | POA: Diagnosis not present

## 2024-04-07 NOTE — Progress Notes (Signed)
 Subjective:  Patient ID: Vickie Burns, female    DOB: February 11, 1941,   MRN: [id number]  Chief Complaint  Patient presents with   Foot Orthotics    Pt stated that it is time for her to get new shoes but her current doctor no longer does them    83 y.o. female presents for diabetic foot check and to get diabetic shoes as well as concern of thickened elongated and painful nails that are difficult to trim. Requesting to have them trimmed today. Relates burning and tingling in their feet. Patient is diabetic and last A1c was  Lab Results  Component Value Date   HGBA1C 7.0 (H) 04/06/2022   .   PCP:  Jobe Mulder, DO    . Denies any other pedal complaints. Denies n/v/f/c.   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   Essential hypertension 10/07/2016   Fall at home, initial encounter 04/05/2022   Family history of breast cancer    aunt   History of adenomatous polyp of colon 02/12/2021   HLD (hyperlipidemia) 08/17/2012   Hypercholesteremia 2000   Hypercholesterolemia 2000   Hypertension    history   Long term (current) use of anticoagulants 12/14/2021   Long term (current) use of insulin  (HCC) 02/12/2021   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 CFT <3 seconds. Absent hair growth on digits. Edema noted to bilateral lower extremities. Xerosis noted bilaterally.  Skin. No lacerations or abrasions bilateral feet. Nails 1-5 bilateral  are thickened discolored and elongated with subungual debris.  Musculoskeletal: MMT 5/5 bilateral lower extremities in DF, PF, Inversion and Eversion. Deceased ROM in DF of ankle joint. Hammered digits 2-4 bilateral.  Neurological: Sensation intact to light touch. Protective sensation diminished bilateral.    Assessment:   1. Pain due to onychomycosis of toenails of both feet   2. Diabetic peripheral neuropathy associated with type 2 diabetes mellitus (HCC)   3. Hammertoe, bilateral      Plan:  Patient was evaluated and treated and all questions answered. -Discussed and educated patient on diabetic foot care, especially with  regards to the vascular, neurological and musculoskeletal systems.  -Stressed the importance of good glycemic control and the detriment of not  controlling glucose levels in relation to the foot. -Discussed supportive shoes at all times and checking feet regularly.  -Mechanically debrided all nails 1-5 bilateral using sterile nail nipper and filed with dremel without incident  -Answered all patient questions -Prescription for  DM shoes provided.  -Patient to return  in 3 months for at risk foot care -Patient advised to call the office if any problems or questions arise in the meantime.'  Merel Santoli, DPM

## 2024-04-21 ENCOUNTER — Other Ambulatory Visit: Payer: Self-pay | Admitting: Cardiology

## 2024-04-22 ENCOUNTER — Other Ambulatory Visit

## 2024-05-04 ENCOUNTER — Ambulatory Visit: Admitting: Cardiology

## 2024-05-06 ENCOUNTER — Ambulatory Visit

## 2024-05-06 NOTE — Progress Notes (Signed)
 Patient thought appt was here but was for Vickie Burns  Vickie Burns

## 2024-05-11 ENCOUNTER — Encounter: Payer: Self-pay | Admitting: *Deleted

## 2024-05-12 ENCOUNTER — Encounter: Payer: Self-pay | Admitting: Cardiology

## 2024-05-12 ENCOUNTER — Ambulatory Visit: Attending: Cardiology | Admitting: Cardiology

## 2024-05-12 VITALS — BP 120/60 | HR 62 | Ht 68.0 in | Wt 183.0 lb

## 2024-05-12 DIAGNOSIS — I10 Essential (primary) hypertension: Secondary | ICD-10-CM

## 2024-05-12 DIAGNOSIS — I2510 Atherosclerotic heart disease of native coronary artery without angina pectoris: Secondary | ICD-10-CM

## 2024-05-12 DIAGNOSIS — E11311 Type 2 diabetes mellitus with unspecified diabetic retinopathy with macular edema: Secondary | ICD-10-CM

## 2024-05-12 DIAGNOSIS — E7800 Pure hypercholesterolemia, unspecified: Secondary | ICD-10-CM

## 2024-05-12 DIAGNOSIS — I4891 Unspecified atrial fibrillation: Secondary | ICD-10-CM | POA: Diagnosis not present

## 2024-05-12 NOTE — Progress Notes (Signed)
 Cardiology Office Note:    Date:  05/12/2024   ID:  Vickie Burns, Vickie Burns 1941-01-09, MRN [id number]  PCP:  Frann Mabel Mt, DO  Cardiologist:  Jennifer JONELLE Crape, MD   Referring MD: Frann Mabel Mt*    ASSESSMENT:    1. Atrial fibrillation, unspecified type (HCC)   2. Essential hypertension   3. Coronary arteriosclerosis   4. 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 Hypercholesteremia    PLAN:    In order of problems listed above:  Coronary arteriosclerosis: Secondary prevention stressed with the patient.  Importance of compliance with diet medication stressed and vocalized understanding.  She was advised to walk on a regular basis. Essential hypertension: Blood pressure is stable and diet was emphasized. Mixed dyslipidemia: On lipid-lowering medications and lipids were reviewed from [facility] sheets and discussed with her. Diabetes mellitus: Stable and managed by primary care. Atrial fibrillation:I discussed with the patient atrial fibrillation, disease process. Management and therapy including rate and rhythm control, anticoagulation benefits and potential risks were discussed extensively with the patient. Patient had multiple questions which were answered to patient's satisfaction. Patient gives history of left leg bigger than the right leg for very long.  Over time.  She tells me that she has been evaluated for blood clot in the left leg in the past.  She is on anticoagulation and takes it meticulously.  I reassured her about my findings.  No active intervention at this time. Patient will be seen in follow-up appointment in 6 months or earlier if the patient has any concerns.    Medication Adjustments/Labs and Tests Ordered: Current medicines are reviewed at length with the patient today.  Concerns regarding medicines are outlined above.  No orders of the  defined types were placed in this encounter.  No orders of the defined types were placed in this encounter.    Chief Complaint  Patient presents with   Follow-up     History of Present Illness:    Vickie Burns is a 83 y.o. female.  Patient has past medical history of coronary arthrosclerosis, atrial fibrillation, essential hypertension, dyslipidemia and diabetes mellitus.  She denies any problems at this time and takes care of activities of daily living.  No chest pain orthopnea or PND.  At the time of my evaluation, the patient is alert awake oriented and in no distress.  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   Essential hypertension 10/07/2016   Fall at home, initial encounter 04/05/2022   Family history of breast cancer    aunt   History of adenomatous polyp of colon 02/12/2021   HLD (hyperlipidemia) 08/17/2012   Hypercholesteremia 2000   Hypercholesterolemia 2000   Hypertension    history   Long term (current) use of anticoagulants 12/14/2021   Long term (current) use of insulin  (HCC) 02/12/2021   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   New onset a-fib (HCC) 06/28/2020  Osteoporosis 09/06/2016   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   Persistent atrial fibrillation (HCC) 06/07/2021   Personal history of malignant neoplasm of breast 02/12/2021   Personal history of radiation therapy    Postmenopausal    took HRT 1999 - 2004    Preop cardiovascular exam 07/03/2020   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   Traumatic ecchymosis of right lower leg 04/05/2022   Unspecified atrial fibrillation (HCC) 07/03/2020    Past Surgical History:  Procedure Laterality Date   BREAST BIOPSY     BREAST BIOPSY Left 10/29/2022   MM LT BREAST BX W LOC DEV 1ST LESION IMAGE BX SPEC STEREO GUIDE 10/29/2022 [facility]   BREAST EXCISIONAL BIOPSY Left 09/07/2020   BREAST LUMPECTOMY  04/2009   rt breast  estrogen +, Her 2 Nu negative   BREAST LUMPECTOMY WITH RADIOACTIVE SEED LOCALIZATION Left 09/07/2020   Procedure: RADIOCATIVE SEED GUIDED LEFT BREAST LUMPECTOMY;  Surgeon: Ethyl Lenis, MD;  Location: [facility];  Service: General;  Laterality: Left;   COLONOSCOPY WITH PROPOFOL  N/A 04/05/2014   Procedure: COLONOSCOPY WITH PROPOFOL ;  Surgeon: Gladis MARLA Louder, MD;  Location: [facility];  Service: Endoscopy;  Laterality: N/A;   ORIF FOOT FRACTURE Right 04/20/2014   done in [place]   WISDOM TOOTH EXTRACTION      Current Medications: Current Meds  Medication Sig   Calcium  Carbonate-Vitamin D3 600-400 MG-UNIT TABS Take 1 tablet by mouth 2 (two) times daily.   digoxin  (LANOXIN ) 0.125 MG tablet TAKE 1 TABLET BY MOUTH DAILY   ELIQUIS  5 MG TABS tablet TAKE 1 TABLET BY MOUTH TWICE  DAILY   ezetimibe  (ZETIA ) 10 MG tablet Take 10 mg by mouth every evening.   gabapentin  (NEURONTIN ) 600 MG tablet Take 600 mg by mouth 3 (three) times daily.   HUMALOG MIX 50/50 KWIKPEN (50-50) 100 UNIT/ML KwikPen Inject 10-16 Units into the skin as directed. 10/23/2023 Takes 12 units in the morning, Take 112-14 units at lunch, Take 18 units in the evening. Based on blood sugar.   JARDIANCE  25 MG TABS tablet Take 25 mg by mouth daily.   metFORMIN  (GLUCOPHAGE ) 500 MG tablet Take 1,000 mg by mouth 2 (two) times daily.   Multiple Vitamins-Minerals (PRESERVISION AREDS 2) CAPS Take by mouth 2 (two) times daily.   NOVOFINE PEN NEEDLE 32G X 6 MM MISC  Inject into the skin.   rosuvastatin  (CRESTOR ) 40 MG tablet Take 40 mg by mouth every evening.   vitamin B-12 (CYANOCOBALAMIN) 1000 MCG tablet Take 1,000 mcg by mouth daily.   White Petrolatum-Mineral Oil (ARTIFICIAL EYE OP) Place 1 drop into both eyes in the morning and at bedtime.     Allergies:   Latex   Social History   Socioeconomic History   Marital status: Widowed    Spouse name: Not on file   Number of children: Not on file   Years of education: Not on file   Highest education level: Not on file  Occupational History   Not on file  Tobacco Use   Smoking status: Former    Current packs/day: 0.00    Average packs/day: 0.3 packs/day for 30.0 years (7.5 ttl pk-yrs)    Types: Cigarettes    Start date: 03/30/1962    Quit date: 11/12/1991    Years since quitting: 32.5   Smokeless tobacco: Never  Vaping Use   Vaping status: Never Used  Substance and Sexual Activity   Alcohol  use: No    Alcohol /week: 0.0 standard drinks of alcohol   Drug use: No   Sexual activity: Not Currently    Birth control/protection: Post-menopausal  Other Topics Concern   Not on file  Social History Narrative   2 sons, one adopted   Social Drivers of Corporate investment banker Strain: Not on file  Food Insecurity: No Food Insecurity (02/21/2022)   [facility]   Hunger Vital Sign    Within the past 12 months, you worried that your food would run out before you got the money to buy more.: Never true    Within the past 12 months, the food you bought just didn't last and you didn't have money to get more.: Never true  Transportation Needs: Not on file  Physical Activity: Not on file  Stress: Not on file  Social Connections: Unknown (03/24/2022)   Received from [facility]   Social Network    Social Network: Not on file     Family History: The patient's family history includes Diabetes in her father; Heart disease in her father and mother; Heart failure in her father and  mother; Obesity in her brother.  ROS:   Please see the history of present illness.    All other systems reviewed and are negative.  EKGs/Labs/Other Studies Reviewed:    The following studies were reviewed today: .SABRA   I discussed my findings with the patient at length   Recent Labs: 10/23/2023: ALT 10; BUN 28; Creatinine 1.09; Hemoglobin 13.2; Platelet Count 228; Potassium 5.3; Sodium 142  Recent Lipid Panel    Component Value Date/Time   CHOL 115 06/14/2021 0821   TRIG 111 06/14/2021 0821   HDL 47 06/14/2021 0821   CHOLHDL 2.4 06/14/2021 0821   LDLCALC 48 06/14/2021 0821    Physical Exam:    VS:  BP 120/60   Pulse 62   Ht 5' 8 (1.727 m)   Wt 183 lb (83 kg)   LMP  (LMP Unknown)   SpO2 95%   BMI 27.83 kg/m     Wt Readings from Last 3 Encounters:  05/12/24 183 lb (83 kg)  12/04/23 180 lb (81.6 kg)  10/23/23 185 lb (83.9 kg)     GEN: Patient is in no acute distress HEENT: Normal NECK: No JVD; No carotid bruits LYMPHATICS: No lymphadenopathy CARDIAC: Hear sounds regular, 2/6 systolic murmur at the apex. RESPIRATORY:  Clear to auscultation without rales, wheezing or rhonchi  ABDOMEN: Soft, non-tender, non-distended MUSCULOSKELETAL:  No edema; No deformity  SKIN: Warm and dry NEUROLOGIC:  Alert and oriented x 3 PSYCHIATRIC:  Normal affect   Signed, Jennifer JONELLE Crape, MD  05/12/2024 2:41 PM    [facility]

## 2024-05-12 NOTE — Patient Instructions (Signed)
 Medication Instructions:  Your physician recommends that you continue on your current medications as directed. Please refer to the Current Medication list given to you today.  *If you need a refill on your cardiac medications before your next appointment, please call your pharmacy*  Lab Work: None If you have labs (blood work) drawn today and your tests are completely normal, you will receive your results only by: MyChart Message (if you have MyChart) OR A paper copy in the mail If you have any lab test that is abnormal or we need to change your treatment, we will call you to review the results.  Testing/Procedures: None  Follow-Up: At [facility], you and your health needs are our priority.  As part of our continuing mission to provide you with exceptional heart care, our providers are all part of one team.  This team includes your primary Cardiologist (physician) and Advanced Practice Providers or APPs (Physician Assistants and Nurse Practitioners) who all work together to provide you with the care you need, when you need it.  Your next appointment:   9 month(s)  Provider:   Belva Crome, MD    We recommend signing up for the patient portal called "MyChart".  Sign up information is provided on this After Visit Summary.  MyChart is used to connect with patients for Virtual Visits (Telemedicine).  Patients are able to view lab/test results, encounter notes, upcoming appointments, etc.  Non-urgent messages can be sent to your provider as well.   To learn more about what you can do with MyChart, go to [email or web address].   Other Instructions None

## 2024-07-05 ENCOUNTER — Ambulatory Visit: Admitting: Podiatry

## 2024-07-22 ENCOUNTER — Ambulatory Visit (INDEPENDENT_AMBULATORY_CARE_PROVIDER_SITE_OTHER)

## 2024-07-22 VITALS — Ht 70.0 in | Wt 175.0 lb

## 2024-07-22 DIAGNOSIS — Z0000 Encounter for general adult medical examination without abnormal findings: Secondary | ICD-10-CM

## 2024-07-22 NOTE — Patient Instructions (Addendum)
 Vickie Burns,  Thank you for taking the time for your Medicare Wellness Visit. I appreciate your continued commitment to your health goals. Please review the care plan we discussed, and feel free to reach out if I can assist you further.  Medicare recommends these wellness visits once per year to help you and your care team stay ahead of potential health issues. These visits are designed to focus on prevention, allowing your provider to concentrate on managing your acute and chronic conditions during your regular appointments.  Please note that Annual Wellness Visits do not include a physical exam. Some assessments may be limited, especially if the visit was conducted virtually. If needed, we may recommend a separate in-person follow-up with your provider.  Ongoing Care Seeing your primary care provider every 3 to 6 months helps us  monitor your health and provide consistent, personalized care.    Referrals If a referral was made during today's visit and you haven't received any updates within two weeks, please contact the referred provider directly to check on the status.  Recommended Screenings:  Health Maintenance  Topic Date Due   Yearly kidney health urinalysis for diabetes  Never done   Hemoglobin A1C  06/04/2023   COVID-19 Vaccine (2 - Pfizer risk series) 08/11/2023   Flu Shot  06/11/2024   Yearly kidney function blood test for diabetes  10/22/2024   Breast Cancer Screening  11/19/2024   Complete foot exam   04/07/2025   Eye exam for diabetics  07/15/2025   Medicare Annual Wellness Visit  07/22/2025   DTaP/Tdap/Td vaccine (3 - Td or Tdap) 12/16/2027   Pneumococcal Vaccine for age over 45  Completed   DEXA scan (bone density measurement)  Completed   Zoster (Shingles) Vaccine  Completed   HPV Vaccine  Aged Out   Meningitis B Vaccine  Aged Out       07/22/2024    2:06 PM  Advanced Directives  Does Patient Have a Medical Advance Directive? Yes  Type of Special educational needs teacher of [place];Living will  Does patient want to make changes to medical advance directive? No - Patient declined  Copy of Healthcare Power of Attorney in Chart? Yes - validated most recent copy scanned in chart (See row information)   Advance Care Planning is important because it: Ensures you receive medical care that aligns with your values, goals, and preferences. Provides guidance to your family and loved ones, reducing the emotional burden of decision-making during critical moments.  Vision: Annual vision screenings are recommended for early detection of glaucoma, cataracts, and diabetic retinopathy. These exams can also reveal signs of chronic conditions such as diabetes and high blood pressure.  Dental: Annual dental screenings help detect early signs of oral cancer, gum disease, and other conditions linked to overall health, including heart disease and diabetes.  Please see the attached documents for additional preventive care recommendations.

## 2024-07-22 NOTE — Progress Notes (Signed)
 Subjective:   Vickie Burns is a 83 y.o. who presents for a Medicare Wellness preventive visit.  As a reminder, Annual Wellness Visits don't include a physical exam, and some assessments may be limited, especially if this visit is performed virtually. We may recommend an in-person follow-up visit with your provider if needed.  Visit Complete: Virtual I connected with  Vickie Burns on 07/22/24 by a audio enabled telemedicine application and verified that I am speaking with the correct person using two identifiers.  Patient Location: Home  Provider Location: Home Office  I discussed the limitations of evaluation and management by telemedicine. The patient expressed understanding and agreed to proceed.  Vital Signs: Because this visit was a virtual/telehealth visit, some criteria may be missing or patient reported. Any vitals not documented were not able to be obtained and vitals that have been documented are patient reported.    Persons Participating in Visit: Patient.  AWV Questionnaire: Yes: Patient Medicare AWV questionnaire was completed by the patient on 07/20/24; I have confirmed that all information answered by patient is correct and no changes since this date.  Cardiac Risk Factors include: advanced age (>34men, >79 women);diabetes mellitus;hypertension     Objective:    Today's Vitals   07/22/24 1358  Weight: 175 lb (79.4 kg)  Height: 5' 10 (1.778 m)   Body mass index is 25.11 kg/m.     07/22/2024    2:06 PM 10/23/2023    9:50 AM 04/05/2022    1:21 PM 03/30/2022    6:03 PM 09/07/2020    2:08 PM 08/30/2020    3:13 PM 06/28/2020    2:31 PM  Advanced Directives  Does Patient Have a Medical Advance Directive? Yes Yes No Yes Yes Yes Yes  Type of Estate agent of [place];Living will Healthcare Power of [place];Living will    Living will;Healthcare Power of [employer] Power of [place];Living will  Does patient want to make  changes to medical advance directive? No - Patient declined    No - Patient declined No - Patient declined No - Patient declined  Copy of Healthcare Power of Attorney in Chart? Yes - validated most recent copy scanned in chart (See row information)     No - copy requested No - copy requested  Would patient like information on creating a medical advance directive?   No - Patient declined        Current Medications (verified) Outpatient Encounter Medications as of 07/22/2024  Medication Sig   Calcium  Carbonate-Vitamin D3 600-400 MG-UNIT TABS Take 1 tablet by mouth 2 (two) times daily.   digoxin  (LANOXIN ) 0.125 MG tablet TAKE 1 TABLET BY MOUTH DAILY   ELIQUIS  5 MG TABS tablet TAKE 1 TABLET BY MOUTH TWICE  DAILY   ezetimibe  (ZETIA ) 10 MG tablet Take 10 mg by mouth every evening.   gabapentin  (NEURONTIN ) 600 MG tablet Take 600 mg by mouth 3 (three) times daily.   HUMALOG MIX 50/50 KWIKPEN (50-50) 100 UNIT/ML KwikPen Inject 10-16 Units into the skin as directed. 10/23/2023 Takes 12 units in the morning, Take 112-14 units at lunch, Take 18 units in the evening. Based on blood sugar.   JARDIANCE  25 MG TABS tablet Take 25 mg by mouth daily.   metFORMIN  (GLUCOPHAGE ) 500 MG tablet Take 1,000 mg by mouth 2 (two) times daily.   Multiple Vitamins-Minerals (PRESERVISION AREDS 2) CAPS Take by mouth 2 (two) times daily.   NOVOFINE PEN NEEDLE 32G X 6 MM MISC Inject  into the skin.   rosuvastatin  (CRESTOR ) 40 MG tablet Take 40 mg by mouth every evening.   vitamin B-12 (CYANOCOBALAMIN) 1000 MCG tablet Take 1,000 mcg by mouth daily.   White Petrolatum-Mineral Oil (ARTIFICIAL EYE OP) Place 1 drop into both eyes in the morning and at bedtime.   No facility-administered encounter medications on file as of 07/22/2024.    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   Essential hypertension 10/07/2016   Fall at home, initial encounter 04/05/2022   Family history of breast cancer    aunt   History of adenomatous polyp of colon 02/12/2021   HLD (hyperlipidemia) 08/17/2012   Hypercholesteremia 2000   Hypercholesterolemia 2000   Hypertension    history   Long term (current) use of anticoagulants 12/14/2021   Long term (current) use of insulin  (HCC) 02/12/2021   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   New onset a-fib (HCC) 06/28/2020   Osteoporosis 09/06/2016   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   Persistent atrial fibrillation (HCC) 06/07/2021   Personal history of malignant neoplasm of breast 02/12/2021   Personal history of radiation therapy    Postmenopausal    took HRT 1999 - 2004   Preop cardiovascular exam 07/03/2020   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   Traumatic ecchymosis of right lower leg 04/05/2022   Unspecified atrial fibrillation (HCC) 07/03/2020   Past Surgical History:  Procedure Laterality Date   BREAST BIOPSY     BREAST BIOPSY Left 10/29/2022   MM LT BREAST BX W LOC DEV 1ST LESION IMAGE BX SPEC STEREO GUIDE 10/29/2022 [facility]   BREAST EXCISIONAL BIOPSY Left 09/07/2020   BREAST LUMPECTOMY  04/2009   rt breast  estrogen +, Her 2 Nu negative   BREAST LUMPECTOMY WITH RADIOACTIVE SEED LOCALIZATION Left 09/07/2020   Procedure: RADIOCATIVE SEED GUIDED LEFT BREAST LUMPECTOMY;  Surgeon: Ethyl Lenis, MD;  Location: [facility];  Service: General;  Laterality: Left;   COLONOSCOPY WITH  PROPOFOL  N/A 04/05/2014   Procedure: COLONOSCOPY WITH PROPOFOL ;  Surgeon: Gladis MARLA Louder, MD;  Location: [facility];  Service: Endoscopy;  Laterality: N/A;   ORIF FOOT FRACTURE Right 04/20/2014   done in [place]   WISDOM TOOTH EXTRACTION     Family History  Problem Relation Age of Onset   Heart disease Mother        Confestive Heart Failure   Heart failure Mother    Diabetes Father    Heart disease Father        Congestive Heart Failure   Heart failure Father    Obesity Brother    Social History   Socioeconomic History   Marital status: Widowed    Spouse name: Not on file   Number of children: Not on file   Years of education: Not on file   Highest education level: Some college, no degree  Occupational History   Not on file  Tobacco Use   Smoking status: Former    Current  packs/day: 0.00    Average packs/day: 0.3 packs/day for 30.0 years (7.5 ttl pk-yrs)    Types: Cigarettes    Start date: 03/30/1962    Quit date: 11/12/1991    Years since quitting: 32.7   Smokeless tobacco: Never  Vaping Use   Vaping status: Never Used  Substance and Sexual Activity   Alcohol  use: No    Alcohol /week: 0.0 standard drinks of alcohol    Drug use: No   Sexual activity: Not Currently    Birth control/protection: Post-menopausal  Other Topics Concern   Not on file  Social History Narrative   2 sons, one adopted   Social Drivers of Corporate investment banker Strain: Low Risk  (07/22/2024)   Overall Financial Resource Strain (CARDIA)    Difficulty of Paying Living Expenses: Not hard at all  Food Insecurity: No Food Insecurity (07/22/2024)   Hunger Vital Sign    Worried About Running Out of Food in the Last Year: Never true    Ran Out of Food in the Last Year: Never true  Transportation Needs: No Transportation Needs (07/22/2024)   PRAPARE - Administrator, Civil Service (Medical): No    Lack of Transportation (Non-Medical): No  Physical Activity: Inactive (07/22/2024)    Exercise Vital Sign    Days of Exercise per Week: 0 days    Minutes of Exercise per Session: 0 min  Stress: No Stress Concern Present (07/22/2024)   [employer] of Occupational Health - Occupational Stress Questionnaire    Feeling of Stress: Not at all  Social Connections: Moderately Isolated (07/22/2024)   Social Connection and Isolation Panel    Frequency of Communication with Friends and Family: More than three times a week    Frequency of Social Gatherings with Friends and Family: Once a week    Attends Religious Services: More than 4 times per year    Active Member of [employer] or Organizations: No    Attends Banker Meetings: Not on file    Marital Status: Widowed    Tobacco Counseling Counseling given: Not Answered    Clinical Intake:  Pre-visit preparation completed: Yes  Pain : No/denies pain     Nutritional Risks: None Diabetes: Yes CBG done?: Yes (CBG 130 per patient) CBG resulted in Enter/ Edit results?: Yes Did pt. bring in CBG monitor from home?: No  Lab Results  Component Value Date   HGBA1C 7.0 (H) 04/06/2022   HGBA1C 7.7 (H) 06/28/2020     How often do you need to have someone help you when you read instructions, pamphlets, or other written materials from your doctor or pharmacy?: 1 - Never  Interpreter Needed?: No  Information entered by :: Rojelio Blush LPN   Activities of Daily Living     07/22/2024    2:04 PM 07/20/2024    7:13 PM  In your present state of health, do you have any difficulty performing the following activities:  Hearing? 0 0  Vision? 0 0  Difficulty concentrating or making decisions? 0 0  Walking or climbing stairs? 0 0  Dressing or bathing? 0 0  Doing errands, shopping? 0 0  Preparing Food and eating ? N N  Using the Toilet? N N  In the past six months, have you accidently leaked urine? N Y  Do you have problems with loss of bowel control? N N  Managing your Medications? N N  Managing your Finances? N  N  Housekeeping or managing your Housekeeping? N  N    Patient Care Team: Frann Mabel Mt, DO as PCP - General (Family Medicine) [name], Jennifer SAUNDERS, MD as PCP - Cardiology (Cardiology)  I have updated your Care Teams any recent Medical Services you may have received from other providers in the past year.     Assessment:   This is a routine wellness examination for Blanche.  Hearing/Vision screen Hearing Screening - Comments:: Denies hearing difficulties   Vision Screening - Comments:: Wears rx glasses - up to date with routine eye exams with  Dr Collis   Goals Addressed               This Visit's Progress     Increase physical activity (pt-stated)        Remain active.       Depression Screen     07/22/2024    2:09 PM 07/21/2023    9:40 AM 07/16/2022    1:20 PM 07/16/2022    1:19 PM  PHQ 2/9 Scores  PHQ - 2 Score 0 0 0 0  PHQ- 9 Score  0 1     Fall Risk     07/22/2024    2:05 PM 07/20/2024    7:13 PM 12/04/2023   11:34 AM 07/21/2023    9:38 AM 07/16/2022    1:18 PM  Fall Risk   Falls in the past year? 0 0 0 1 1  Comment    tripped and fell fell over a cooler strap  Number falls in past yr: 0  0 0 0  Injury with Fall? 0  0 1 1  Comment    hurt right knee   Risk for fall due to : No Fall Risks  No Fall Risks No Fall Risks No Fall Risks  Follow up Falls evaluation completed  Falls evaluation completed Falls evaluation completed Falls evaluation completed      Data saved with a previous flowsheet row definition    MEDICARE RISK AT HOME:  Medicare Risk at Home Any stairs in or around the home?: Yes If so, are there any without handrails?: No Home free of loose throw rugs in walkways, pet beds, electrical cords, etc?: Yes Adequate lighting in your home to reduce risk of falls?: Yes Life alert?: No Use of a cane, walker or w/c?: No Grab bars in the bathroom?: No Shower chair or bench in shower?: No Elevated toilet seat or a handicapped toilet?: No  TIMED  UP AND GO:  Was the test performed?  No  Cognitive Function: 6CIT completed        07/22/2024    2:06 PM  6CIT Screen  What Year? 0 points  What month? 0 points  What time? 0 points  Count back from 20 0 points  Months in reverse 0 points  Repeat phrase 0 points  Total Score 0 points    Immunizations Immunization History  Administered Date(s) Administered   Fluad  Quad(high Dose 65+) 07/02/2019   Fluad  Trivalent(High Dose 65+) 08/06/2023   INFLUENZA, HIGH DOSE SEASONAL PF 07/30/2017   Influenza-Unspecified 08/01/2015, 08/27/2016, 08/13/2018   Pfizer(Comirnaty )Fall Seasonal Vaccine 12 years and older 07/21/2023   Pneumococcal Conjugate-13 08/20/2014   Pneumococcal Polysaccharide-23 07/12/2010   RSV,unspecified 09/19/2022   Tdap 08/28/2015, 12/15/2017   Zoster Recombinant(Shingrix) 06/03/2018, 08/13/2018   Zoster, Live 11/12/2007    Screening Tests Health Maintenance  Topic Date Due   Diabetic kidney evaluation - Urine ACR  Never done   HEMOGLOBIN A1C  06/04/2023   COVID-19 Vaccine (  2 - Pfizer risk series) 08/11/2023   Influenza Vaccine  06/11/2024   Diabetic kidney evaluation - eGFR measurement  10/22/2024   Mammogram  11/19/2024   FOOT EXAM  04/07/2025   OPHTHALMOLOGY EXAM  07/15/2025   Medicare Annual Wellness (AWV)  07/22/2025   DTaP/Tdap/Td (3 - Td or Tdap) 12/16/2027   Pneumococcal Vaccine: 50+ Years  Completed   DEXA SCAN  Completed   Zoster Vaccines- Shingrix  Completed   HPV VACCINES  Aged Out   Meningococcal B Vaccine  Aged Out    Health Maintenance Items Addressed:   Additional Screening:  Vision Screening: Recommended annual ophthalmology exams for early detection of glaucoma and other disorders of the eye. Is the patient up to date with their annual eye exam?  Yes  Who is the provider or what is the name of the office in which the patient attends annual eye exams? Dr Marleen  Dental Screening: Recommended annual dental exams for proper oral  hygiene  Community Resource Referral / Chronic Care Management: CRR required this visit?  No   CCM required this visit?  No   Plan:    I have personally reviewed and noted the following in the patient's chart:   Medical and social history Use of alcohol , tobacco or illicit drugs  Current medications and supplements including opioid prescriptions. Patient is not currently taking opioid prescriptions. Functional ability and status Nutritional status Physical activity Advanced directives List of other physicians Hospitalizations, surgeries, and ER visits in previous 12 months Vitals Screenings to include cognitive, depression, and falls Referrals and appointments  In addition, I have reviewed and discussed with patient certain preventive protocols, quality metrics, and best practice recommendations. A written personalized care plan for preventive services as well as general preventive health recommendations were provided to patient.   Rojelio LELON Blush, LPN   [id number]   After Visit Summary: (MyChart) Due to this being a telephonic visit, the after visit summary with patients personalized plan was offered to patient via MyChart   Notes: Nothing significant to report at this time.

## 2024-07-23 ENCOUNTER — Ambulatory Visit: Payer: Self-pay | Admitting: Family Medicine

## 2024-07-23 ENCOUNTER — Ambulatory Visit: Payer: Medicare Other | Admitting: Family Medicine

## 2024-07-23 ENCOUNTER — Encounter: Payer: Self-pay | Admitting: Family Medicine

## 2024-07-23 VITALS — BP 122/68 | HR 83 | Temp 97.8°F | Resp 16 | Ht 70.0 in | Wt 179.6 lb

## 2024-07-23 DIAGNOSIS — R413 Other amnesia: Secondary | ICD-10-CM

## 2024-07-23 DIAGNOSIS — E1140 Type 2 diabetes mellitus with diabetic neuropathy, unspecified: Secondary | ICD-10-CM | POA: Diagnosis not present

## 2024-07-23 DIAGNOSIS — E1165 Type 2 diabetes mellitus with hyperglycemia: Secondary | ICD-10-CM | POA: Diagnosis not present

## 2024-07-23 DIAGNOSIS — I10 Essential (primary) hypertension: Secondary | ICD-10-CM

## 2024-07-23 DIAGNOSIS — Z0000 Encounter for general adult medical examination without abnormal findings: Secondary | ICD-10-CM | POA: Diagnosis not present

## 2024-07-23 DIAGNOSIS — Z794 Long term (current) use of insulin: Secondary | ICD-10-CM

## 2024-07-23 DIAGNOSIS — R269 Unspecified abnormalities of gait and mobility: Secondary | ICD-10-CM

## 2024-07-23 LAB — COMPREHENSIVE METABOLIC PANEL WITH GFR
ALT: 11 U/L (ref 0–35)
AST: 19 U/L (ref 0–37)
Albumin: 4.4 g/dL (ref 3.5–5.2)
Alkaline Phosphatase: 47 U/L (ref 39–117)
BUN: 16 mg/dL (ref 6–23)
CO2: 29 meq/L (ref 19–32)
Calcium: 10.8 mg/dL — ABNORMAL HIGH (ref 8.4–10.5)
Chloride: 103 meq/L (ref 96–112)
Creatinine, Ser: 1.1 mg/dL (ref 0.40–1.20)
GFR: 47.1 mL/min — ABNORMAL LOW (ref >60.00)
Glucose, Bld: 102 mg/dL — ABNORMAL HIGH (ref 70–99)
Potassium: 4.7 meq/L (ref 3.5–5.1)
Sodium: 141 meq/L (ref 135–145)
Total Bilirubin: 0.7 mg/dL (ref 0.2–1.2)
Total Protein: 6.5 g/dL (ref 6.0–8.3)

## 2024-07-23 LAB — CBC
HCT: 43 % (ref 36.0–46.0)
Hemoglobin: 13.8 g/dL (ref 12.0–15.0)
MCHC: 32 g/dL (ref 30.0–36.0)
MCV: 87.3 fl (ref 78.0–100.0)
Platelets: 239 K/uL (ref 150.0–400.0)
RBC: 4.93 Mil/uL (ref 3.87–5.11)
RDW: 17.3 % — ABNORMAL HIGH (ref 11.5–15.5)
WBC: 9.8 K/uL (ref 4.0–10.5)

## 2024-07-23 LAB — LIPID PANEL
Cholesterol: 112 mg/dL (ref 0–200)
HDL: 51.2 mg/dL (ref >39.00)
LDL Cholesterol: 38 mg/dL (ref 0–99)
NonHDL: 60.74
Total CHOL/HDL Ratio: 2
Triglycerides: 114 mg/dL (ref 0.0–149.0)
VLDL: 22.8 mg/dL (ref 0.0–40.0)

## 2024-07-23 LAB — VITAMIN B12: Vitamin B-12: 754 pg/mL (ref 211–911)

## 2024-07-23 LAB — TSH: TSH: 1.61 u[IU]/mL (ref 0.35–5.50)

## 2024-07-23 LAB — HEMOGLOBIN A1C: Hgb A1c MFr Bld: 7.5 % — ABNORMAL HIGH (ref 4.6–6.5)

## 2024-07-23 MED ORDER — VENLAFAXINE HCL ER 37.5 MG PO CP24
37.5000 mg | ORAL_CAPSULE | Freq: Every day | ORAL | 3 refills | Status: DC
Start: 2024-07-23 — End: 2024-08-23

## 2024-07-23 NOTE — Progress Notes (Signed)
 Chief Complaint  Patient presents with   Annual Exam    Vickie Burns     Vickie Burns is here for a complete physical.   Her last physical was >1 year ago.  Current diet: in general, a healthy diet. Current exercise: walking. Weight is stable and she confirms daytime fatigue. Seatbelt? Yes Advanced directive? Yes  Health Maintenance Shingrix- Yes DEXA- Yes Tetanus- Yes Pneumonia- Yes  Neuropathy Patient has a history of diabetic neuropathy.  She currently takes gabapentin  600 mg 3 times daily.  She does not like having to take it 3 times daily and thinks it makes her tired.  She does have some gait instability.  Exercise as above.  She has never tried anything else.  Memory issues Patient will sometimes have difficulties remembering things.  She does not forget people she knows/loves.  Mood is stable.  She wakes up frequently to urinate at night.  Sugars have been stable.  Compliant with medications.  No snoring.  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   Essential hypertension 10/07/2016   Fall at home, initial encounter 04/05/2022   Family history of breast cancer    aunt   History of adenomatous polyp of colon 02/12/2021   HLD (hyperlipidemia) 08/17/2012   Hypercholesteremia 2000   Hypercholesterolemia 2000   Hypertension    history   Long term (current) use of anticoagulants 12/14/2021   Long term (current) use of insulin  (HCC)  02/12/2021   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   New onset a-fib (HCC) 06/28/2020   Osteoporosis 09/06/2016   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   Persistent atrial fibrillation (HCC) 06/07/2021   Personal history of malignant neoplasm of breast 02/12/2021   Personal history of radiation therapy    Postmenopausal    took HRT 1999 - 2004   Preop cardiovascular exam 07/03/2020   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   Traumatic ecchymosis of right lower leg 04/05/2022   Unspecified atrial fibrillation (HCC) 07/03/2020     Past Surgical History:  Procedure Laterality Date   BREAST BIOPSY     BREAST BIOPSY Left 10/29/2022   MM LT BREAST BX W LOC DEV 1ST LESION IMAGE BX SPEC STEREO GUIDE 10/29/2022 [facility]   BREAST EXCISIONAL BIOPSY Left 09/07/2020   BREAST LUMPECTOMY  04/2009   rt breast  estrogen +, Her 2 Nu negative   BREAST LUMPECTOMY WITH RADIOACTIVE SEED LOCALIZATION Left 09/07/2020   Procedure: RADIOCATIVE SEED GUIDED LEFT BREAST LUMPECTOMY;  Surgeon: Ethyl Lenis, MD;  Location: [facility];  Service: General;  Laterality: Left;   COLONOSCOPY WITH PROPOFOL  N/A 04/05/2014   Procedure: COLONOSCOPY WITH PROPOFOL ;  Surgeon: Gladis MARLA Louder, MD;  Location: [facility];  Service: Endoscopy;  Laterality: N/A;   ORIF FOOT FRACTURE Right 04/20/2014   done in [place]   WISDOM TOOTH EXTRACTION      Medications  Current Outpatient Medications on File Prior to Visit  Medication Sig Dispense Refill   Calcium  Carbonate-Vitamin D3 600-400 MG-UNIT TABS  Take 1 tablet by mouth 2 (two) times daily.     digoxin  (LANOXIN ) 0.125 MG tablet TAKE 1 TABLET BY MOUTH DAILY 90 tablet 1   ELIQUIS  5 MG TABS tablet TAKE 1 TABLET BY MOUTH TWICE  DAILY 180 tablet 3   ezetimibe  (ZETIA ) 10 MG tablet Take 10 mg by mouth every evening.     HUMALOG MIX 50/50 KWIKPEN (50-50) 100 UNIT/ML KwikPen Inject 10-16 Units into the skin as directed. 10/23/2023 Takes 12 units in the  morning, Take 112-14 units at lunch, Take 18 units in the evening. Based on blood sugar.     JARDIANCE  25 MG TABS tablet Take 25 mg by mouth daily.     metFORMIN  (GLUCOPHAGE ) 500 MG tablet Take 1,000 mg by mouth 2 (two) times daily.     Multiple Vitamins-Minerals (PRESERVISION AREDS 2) CAPS Take by mouth 2 (two) times daily.     NOVOFINE PEN NEEDLE 32G X 6 MM MISC Inject into the skin.     rosuvastatin  (CRESTOR ) 40 MG tablet Take 40 mg by mouth every evening.     vitamin B-12 (CYANOCOBALAMIN) 1000 MCG tablet Take 1,000 mcg by mouth daily.     White Petrolatum-Mineral Oil (ARTIFICIAL EYE OP) Place 1 drop into both eyes in the morning and at bedtime.      Allergies Allergies  Allergen Reactions   Latex     Added based on information entered during case entry, please review and add reactions, type, and severity as needed    Review of Systems: Constitutional:  no fevers Eye:  no recent significant change in vision Ears:  No changes in hearing Nose/Mouth/Throat:  no complaints of nasal congestion, no sore throat Cardiovascular: no chest pain Respiratory:  No shortness of breath Gastrointestinal:  No change in bowel habits GU:  Female: negative for dysuria Integumentary:  no abnormal skin lesions reported Neurologic:  no headaches Endocrine:  denies unexplained weight changes  Exam BP 122/68 (BP Location: Left Arm, Patient Position: Sitting)   Pulse 83   Temp 97.8 F (36.6 C) (Oral)   Resp 16   Ht 5' 10 (1.778 m)   Wt 179 lb 9.6 oz (81.5 kg)   LMP  (LMP Unknown)   SpO2 98%   BMI 25.77 kg/m  General:  Vickie developed, Vickie nourished, in no apparent distress Skin:  no significant moles, warts, or growths Head:  no masses, lesions, or tenderness Eyes:  pupils equal and round, sclera anicteric without injection Ears:  canals without lesions, TMs shiny without retraction, no obvious effusion, no erythema Nose:  nares patent, mucosa normal, and no drainage Throat/Pharynx:  lips  and gingiva without lesion; tongue and uvula midline; non-inflamed pharynx; no exudates or postnasal drainage Neck: neck supple without adenopathy, thyromegaly, or masses Lungs:  clear to auscultation, breath sounds equal bilaterally, no respiratory distress Cardio:  regular rate and rhythm, no bruits or LE edema Abdomen:  abdomen soft, nontender; bowel sounds normal; no masses or organomegaly Genital: Deferred Neuro:  gait slow/cautious; deep tendon reflexes normal and symmetric Psych: Vickie oriented with normal range of affect and appropriate judgment/insight  Assessment and Plan  Vickie adult exam  Essential hypertension - Plan: CBC, Comprehensive metabolic panel with GFR, Lipid panel  Type 2 diabetes mellitus with hyperglycemia, with long-term current use of insulin  (HCC) - Plan: Hemoglobin A1c  Type 2 diabetes mellitus with diabetic neuropathy, with long-term current use of insulin  (HCC) - Plan: venlafaxine  XR (EFFEXOR  XR) 37.5 MG 24 hr capsule  Memory  deficit - Plan: TSH, B12, Ambulatory referral to Neurology  Gait abnormality - Plan: Ambulatory referral to Physical Therapy   Vickie 83 y.o. female. Counseled on diet and exercise. Advanced directive form requested today.  Memory issues: Add thyroid and B12. Refer Neuro.  MRI if still elevated.   Neuropathy: Chronic, AE of med. Stop gabapentin , start Effexor  XR 37.5 mg/d. F/u in 1 mo.  Gait issues: Refer to physical therapy. Other orders as above. The patient voiced understanding and agreement to the plan.  Mabel Mt [place], DO 07/23/24 9:53 AM

## 2024-07-23 NOTE — Patient Instructions (Addendum)
 Give us  2-3 business days to get the results of your labs back.   Keep the diet clean and stay active.  Aim to do some physical exertion for 150 minutes per week. This is typically divided into 5 days per week, 30 minutes per day. The activity should be enough to get your heart rate up. Anything is better than nothing if you have time constraints.  Please get me a copy of your advanced directive form at your convenience.   Cut down fluid intake within 90 minutes of bedtime.  If you do not hear anything about your referrals in the next 1-2 weeks, call our office and ask for an update.  If the addition labs are normal, we will proceed with a MRI of your brain.   Take 4000 u of Vit D daily.   Let us  know if you need anything.

## 2024-07-26 ENCOUNTER — Ambulatory Visit: Payer: Self-pay

## 2024-07-26 NOTE — Telephone Encounter (Signed)
 OK to send her labs to her address if we haven't already. Thx.

## 2024-07-26 NOTE — Telephone Encounter (Signed)
 This is a duplicate encounter.

## 2024-07-26 NOTE — Telephone Encounter (Signed)
 This RN attempted to call back patient. No answer, left voicemail with callback number.          Copied from CRM #[id number]. Topic: Clinical - Medication Question >> Jul 26, 2024 11:05 AM Vickie Burns wrote: Reason for CRM: Patient called said she wants to go back on gabapentin , because she cannot handle venlafaxine  XR (EFFEXOR  XR) 37.5 MG 24 hr capsule [[id number]] because of the leg shakes, and a sharp pain and unable to sleep. >> Jul 26, 2024  4:20 PM [name] wrote: Pt calling back to find out if she should take her gabapentin . CMA from the office did make a note to schedule a lab visit however it still does not answer her question about the medication which the pt really need to know as she's in pain.  >> Jul 26, 2024 12:13 PM Mesmerise C wrote: Patient returning call transferred to NT

## 2024-07-26 NOTE — Telephone Encounter (Signed)
 FYI Only or Action Required?: Action required by provider: clinical question for provider and request for documentation or forms.  Patient was last seen in primary care on 07/23/2024 by Frann Mabel Mt, DO.  Called Nurse Triage reporting Medication Problem.  Symptoms began yesterday.  Interventions attempted: Other: skip dose today.  Symptoms are: unchanged.  Triage Disposition: Call PCP When Office is Open  Patient/caregiver understands and will follow disposition?: No, wishes to speak with PCP     Reason for Disposition  [1] Caller has NON-URGENT medicine question about med that PCP prescribed AND [2] triager unable to answer question  Answer Assessment - Initial Assessment Questions 1. NAME of MEDICINE: What medicine(s) are you calling about?     Venlafaxine  XR and Gabapentin        Can I switch back to Gabapentin ?  HPI: diabetic neuropathy was being managed on Gabapentin  TID, gabapentin  was causing low energy so her  Gabapentin  was discontinued and started venlafaxine  XR. She took first dose of venlafaxine  XR yesterday and had difficulty sleeping due to legs jumping and sharp pain. She would like to go back to gabapentin . She would like to ask Dr. Frann if she can go back on Gabapentin  today and if so how should she take it today, had been dosing morning, noon and evening.   Also requesting: Please mail recent lab results with explanation.  Protocols used: Medication Question Call-A-AH

## 2024-07-26 NOTE — Telephone Encounter (Signed)
 Called pt la appt scheduled.

## 2024-07-26 NOTE — Telephone Encounter (Signed)
 1st attempt to contact patient, no answer, LVM   Copied from CRM #[id number]. Topic: Clinical - Medication Question >> Jul 26, 2024 11:05 AM Macario HERO wrote: Reason for CRM: Patient called said she wants to go back on gabapentin , because she cannot handle venlafaxine  XR (EFFEXOR  XR) 37.5 MG 24 hr capsule [[id number]] because of the leg shakes, and a sharp pain and unable to sleep.

## 2024-07-26 NOTE — Telephone Encounter (Signed)
 This encounter was created in error - please disregard.

## 2024-07-27 ENCOUNTER — Telehealth: Payer: Self-pay

## 2024-07-27 NOTE — Telephone Encounter (Signed)
 Copied from CRM [id number]. Topic: Clinical - Medical Advice >> Jul 27, 2024  2:19 PM Shereese L wrote: Reason for CRM: Patient stated that she fell and hit chin and head and currently felt funny. She stated that her sugar was as high as 199 and came down after she took her insulin ... Refused Triage and stated that she just wanted the doctor to be aware that she fell

## 2024-07-28 ENCOUNTER — Other Ambulatory Visit

## 2024-08-12 ENCOUNTER — Other Ambulatory Visit: Payer: Self-pay | Admitting: Cardiology

## 2024-08-12 DIAGNOSIS — I480 Paroxysmal atrial fibrillation: Secondary | ICD-10-CM

## 2024-08-12 NOTE — Telephone Encounter (Signed)
 Prescription refill request for Eliquis  received. Indication:afib Last office visit:7/25 Scr:1.10  9/25 Age: 83 Weight:81.5  kg  Prescription refilled

## 2024-08-23 ENCOUNTER — Other Ambulatory Visit (HOSPITAL_BASED_OUTPATIENT_CLINIC_OR_DEPARTMENT_OTHER): Payer: Self-pay

## 2024-08-23 ENCOUNTER — Encounter: Payer: Self-pay | Admitting: Family Medicine

## 2024-08-23 ENCOUNTER — Ambulatory Visit (INDEPENDENT_AMBULATORY_CARE_PROVIDER_SITE_OTHER): Admitting: Family Medicine

## 2024-08-23 VITALS — BP 126/74 | HR 80 | Temp 98.0°F | Resp 16 | Ht 70.0 in | Wt 179.0 lb

## 2024-08-23 DIAGNOSIS — Z794 Long term (current) use of insulin: Secondary | ICD-10-CM

## 2024-08-23 DIAGNOSIS — E1140 Type 2 diabetes mellitus with diabetic neuropathy, unspecified: Secondary | ICD-10-CM | POA: Diagnosis not present

## 2024-08-23 DIAGNOSIS — Z23 Encounter for immunization: Secondary | ICD-10-CM

## 2024-08-23 MED ORDER — COMIRNATY 30 MCG/0.3ML IM SUSY
0.5000 mL | PREFILLED_SYRINGE | Freq: Once | INTRAMUSCULAR | 0 refills | Status: AC
  Filled 2024-08-23: qty 0.3, 1d supply, fill #0

## 2024-08-23 NOTE — Addendum Note (Signed)
 Addended by: TRUDY CURVIN RAMAN on: 08/23/2024 10:22 AM   Modules accepted: Orders

## 2024-08-23 NOTE — Patient Instructions (Addendum)
 OK to go back on the gabapentin .   Give us  2-3 business days to get the results of your labs back.   Let us  know if you need anything.

## 2024-08-23 NOTE — Progress Notes (Signed)
 Chief Complaint  Patient presents with   Follow-up    Follow Up     Subjective: Patient is a 83 y.o. female here for f/u.  Patient tolerating on venlafaxine  37.5 mg daily for neuropathy.  She has since resumed her gabapentin  which is what she was taking prior to this.  Reports making her feel terrible. She would prefer to stay on the gabapentin .   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   Essential hypertension 10/07/2016   Fall at home, initial encounter 04/05/2022   Family history of breast cancer    aunt   History of adenomatous polyp of colon 02/12/2021   HLD (hyperlipidemia) 08/17/2012   Hypercholesteremia 2000   Hypercholesterolemia 2000   Hypertension    history   Long term (current) use of anticoagulants 12/14/2021   Long term (current) use of insulin  (HCC) 02/12/2021   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   New onset a-fib (HCC) 06/28/2020   Osteoporosis 09/06/2016   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   Persistent atrial fibrillation (HCC) 06/07/2021   Personal history of malignant neoplasm of breast 02/12/2021   Personal history of radiation therapy    Postmenopausal    took HRT 1999 - 2004   Preop cardiovascular exam 07/03/2020   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   Traumatic ecchymosis of right lower leg 04/05/2022   Unspecified atrial fibrillation (HCC) 07/03/2020    Objective: BP 126/74 (BP Location: Left Arm, Patient Position:  Sitting)   Pulse 80   Temp 98 F (36.7 C) (Oral)   Resp 16   Ht 5' 10 (1.778 m)   Wt 179 lb (81.2 kg)   LMP  (LMP Unknown)   SpO2 96%   BMI 25.68 kg/m  General: Awake, appears stated age Heart: RRR, no LE edema Lungs: CTAB, no rales, wheezes or rhonchi. No accessory muscle use Psych: Age appropriate judgment and insight, normal affect and mood  Assessment and Plan: Type 2 diabetes mellitus with diabetic neuropathy, with long-term current use of insulin  (HCC)  Hypercalcemia - Plan: Comp Met (CMET), PTH, intact (no Ca)  Need for influenza vaccination - Plan: Flu vaccine HIGH DOSE PF(Fluzone Trivalent)  Will stay on gabapentin  600 mg TID, stop Effexor .  Follow-up on abnormal calcium . Flu shot today. F/u in 11 mo for CPE.  The patient voiced understanding and agreement to the plan.  Mabel Mt [place], DO 08/23/24  11:28 AM

## 2024-08-24 ENCOUNTER — Ambulatory Visit: Payer: Self-pay | Admitting: Family Medicine

## 2024-08-24 LAB — COMPREHENSIVE METABOLIC PANEL WITH GFR
AG Ratio: 2.2 (calc) (ref 1.0–2.5)
ALT: 13 U/L (ref 6–29)
AST: 18 U/L (ref 10–35)
Albumin: 4.2 g/dL (ref 3.6–5.1)
Alkaline phosphatase (APISO): 48 U/L (ref 37–153)
BUN/Creatinine Ratio: 17 (calc) (ref 6–22)
BUN: 19 mg/dL (ref 7–25)
CO2: 28 mmol/L (ref 20–32)
Calcium: 9.6 mg/dL (ref 8.6–10.4)
Chloride: 104 mmol/L (ref 98–110)
Creat: 1.09 mg/dL — ABNORMAL HIGH (ref 0.60–0.95)
Globulin: 1.9 g/dL (ref 1.9–3.7)
Glucose, Bld: 262 mg/dL — ABNORMAL HIGH (ref 65–99)
Potassium: 4.3 mmol/L (ref 3.5–5.3)
Sodium: 139 mmol/L (ref 135–146)
Total Bilirubin: 0.5 mg/dL (ref 0.2–1.2)
Total Protein: 6.1 g/dL (ref 6.1–8.1)
eGFR: 51 mL/min/1.73m2 — ABNORMAL LOW (ref >=60)

## 2024-08-24 LAB — PARATHYROID HORMONE, INTACT (NO CA): PTH: 34 pg/mL (ref 16–77)

## 2024-09-12 NOTE — Therapy (Signed)
 OUTPATIENT PHYSICAL THERAPY NEURO EVALUATION   Patient Name: Vickie Burns MRN: [id number] DOB:[birth year], 83 y.o., female Today's Date: 09/13/2024   END OF SESSION:  PT End of Session - 09/13/24 1021     Visit Number 1    Date for Recertification  11/08/24    PT Start Time 1018    PT Stop Time 1100    PT Time Calculation (min) 42 min    Activity Tolerance Patient tolerated treatment well;No increased pain    Behavior During Therapy [facility] for tasks assessed/performed          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   Essential hypertension 10/07/2016   Fall at home, initial encounter 04/05/2022   Family history of breast cancer    aunt   History of adenomatous polyp of colon 02/12/2021   HLD (hyperlipidemia) 08/17/2012   Hypercholesteremia 2000   Hypercholesterolemia 2000   Hypertension    history   Long term (current) use of anticoagulants 12/14/2021   Long term (current) use of insulin  (HCC) 02/12/2021   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   New onset a-fib (HCC) 06/28/2020   Osteoporosis 09/06/2016   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   Persistent atrial fibrillation (HCC) 06/07/2021   Personal history of malignant neoplasm of breast 02/12/2021   Personal history of radiation therapy    Postmenopausal    took HRT 1999 - 2004   Preop cardiovascular exam 07/03/2020   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   Traumatic ecchymosis of  right lower leg 04/05/2022   Unspecified atrial fibrillation (HCC) 07/03/2020   Past Surgical History:  Procedure Laterality Date   BREAST BIOPSY     BREAST BIOPSY Left 10/29/2022   MM LT BREAST BX W LOC DEV 1ST LESION IMAGE BX SPEC STEREO GUIDE 10/29/2022 [facility]   BREAST EXCISIONAL BIOPSY Left 09/07/2020   BREAST LUMPECTOMY  04/2009   rt breast  estrogen +, Her 2 Nu negative   BREAST LUMPECTOMY WITH RADIOACTIVE SEED LOCALIZATION Left 09/07/2020   Procedure: RADIOCATIVE SEED GUIDED LEFT BREAST LUMPECTOMY;  Surgeon: Ethyl Lenis, MD;  Location: [facility];  Service: General;  Laterality: Left;   COLONOSCOPY WITH PROPOFOL  N/A 04/05/2014   Procedure: COLONOSCOPY WITH PROPOFOL ;  Surgeon: Gladis MARLA Louder, MD;  Location: [facility];  Service: Endoscopy;  Laterality: N/A;   ORIF FOOT FRACTURE Right 04/20/2014   done in [facility] TOOTH EXTRACTION     Patient Active Problem List   Diagnosis Date Noted   Nuclear sclerotic cataract of left eye 02/26/2024   Osteopenia of left forearm 12/09/2023   Well woman exam with routine gynecological exam 12/04/2023   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   Acute blood loss anemia 04/05/2022   Traumatic ecchymosis of right lower leg 04/05/2022   Fall at home, initial encounter 04/05/2022   Long term (current) use of anticoagulants 12/14/2021   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   Coronary arteriosclerosis 09/25/2021   Persistent atrial fibrillation (HCC) 06/07/2021   Diabetic peripheral neuropathy associated  with type 2 diabetes mellitus (HCC) 02/12/2021   Diabetic retinopathy associated with type 2 diabetes mellitus (HCC) 02/12/2021   History of adenomatous polyp of colon 02/12/2021   Long term (current) use of insulin  (HCC) 02/12/2021   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   Personal history of malignant neoplasm of breast 02/12/2021   Atrial fibrillation (HCC) 02/12/2021   Family history of breast cancer     Hypertension    Personal history of radiation therapy    Postmenopausal    Diabetes mellitus due to underlying condition with unspecified complications (HCC) 07/03/2020   Unspecified atrial fibrillation (HCC) 07/03/2020   New onset a-fib (HCC) 06/28/2020   Essential hypertension 10/07/2016   Osteoporosis 09/06/2016   Macular degeneration 2016   HLD (hyperlipidemia) 08/17/2012   Diabetes mellitus, type 2 (HCC) 01/27/2012   Breast CA (HCC) 12/20/2011   Hypercholesterolemia 2000   Hypercholesteremia 2000    PCP: Frann Mabel Mt, DO   REFERRING PROVIDER: Frann Mabel Mt*   REFERRING DIAG: R26.9 (ICD-10-CM) - Gait abnormality  THERAPY DIAG:  Difficulty in walking, not elsewhere classified  Muscle weakness (generalized)  Abnormal posture  RATIONALE FOR EVALUATION AND TREATMENT: Rehabilitation  ONSET DATE: >1 year, but worsening progressively over last few months  NEXT MD VISIT:    SUBJECTIVE:                                                                                                                                                                                                         SUBJECTIVE STATEMENT: 83 y/o female referred to PT from PCP for gait abnormality.   She is also having some memory deficits and brain MRI is pending.  She is also supposed to see a neurologist  Pt accompanied by: self  PAIN: Are you having pain? No  PERTINENT HISTORY:  Afib, Diabetes, neuropathy, retinopathy, osteoporosis, h/o falls, h/o L breast CA   PRECAUTIONS: Other: L breast surgery/lumpectomy  RED FLAGS: None  WEIGHT BEARING RESTRICTIONS: No  FALLS:  Has patient fallen in last 6 months? Yes. Number of falls several  LIVING ENVIRONMENT: Lives with: lives alone Lives in: House/apartment Stairs: Yes: Internal: 14 steps; on right going up and External: 6 steps; bilateral but cannot reach both Has following equipment at home: Single point cane, Walker - 2  wheeled, bed side commode, and Grab bars  OCCUPATION: works for [employer]  PLOF: Independent with gait  PATIENT GOALS: have better balance   OBJECTIVE: (objective measures completed at initial evaluation unless otherwise dated)  DIAGNOSTIC FINDINGS:  Brain MRI is pending  COGNITION: Overall cognitive status: Within functional limits for tasks assessed   SENSATION: WFL  COORDINATION: WNL  EDEMA:  Mild BLE  MUSCLE TONE: WNL  DTRs:  NT  POSTURE:  rounded shoulders and forward head     LOWER EXTREMITY ROM:    MMT Right eval Left eval  Hip flexion    Hip extension    Hip abduction    Hip adduction    Hip internal rotation    Hip external rotation    Knee flexion    Knee extension    Ankle dorsiflexion    Ankle plantarflexion    Ankle inversion    Ankle eversion    (Blank rows = not tested) LOWER EXTREMITY MMT:     Active  Right eval Left eval  Hip flexion 4 4  Hip extension    Hip abduction 4 4  Hip adduction    Hip internal rotation 4+ 4  Hip external rotation 4 4+  Knee flexion 5 5  Knee extension 5 5  Ankle dorsiflexion 4 4-  Ankle plantarflexion    Ankle inversion    Ankle eversion     (Blank rows = not tested) BED MOBILITY:  Rolling to Right CGA Rolling to Left Complete Independence  TRANSFERS: Assistive device utilized: None  Sit to stand: Modified independence Stand to sit: Modified independence Chair to chair: Modified independence Floor: NT  GAIT: Distance walked:  clinic distances and into clinic from parking lot Assistive device utilized: None Level of assistance: SBA Gait pattern: decreased step length- Right, decreased step length- Left, decreased stance time- Right, decreased stance time- Left, decreased ankle dorsiflexion- Right, and decreased ankle dorsiflexion- Left Comments:   FUNCTIONAL TESTS:  PHYSICAL PERFORMANCE TEST or MEASUREMENT:   [facility] PT Assessment - 09/13/24 0001       Standardized Balance  Assessment   Standardized Balance Assessment Berg Balance Test      Berg Balance Test   Sit to Stand Able to stand  independently using hands    Standing Unsupported Able to stand safely 2 minutes    Sitting with Back Unsupported but Feet Supported on Floor or Stool Able to sit safely and securely 2 minutes    Stand to Sit Controls descent by using hands    Transfers Able to transfer safely, definite need of hands    Standing Unsupported with Eyes Closed Able to stand 10 seconds safely    Standing Unsupported with Feet Together Able to place feet together independently and stand 1 minute safely    From Standing, Reach Forward with Outstretched Arm Can reach forward >12 cm safely (5)    From Standing Position, Pick up Object from Floor Able to pick up shoe safely and easily    From Standing Position, Turn to Look Behind Over each Shoulder Looks behind one side only/other side shows less weight shift    Turn 360 Degrees Able to turn 360 degrees safely in 4 seconds or less    Standing Unsupported, Alternately Place Feet on Step/Stool Able to stand independently and complete 8 steps >20 seconds    Standing Unsupported, One Foot in [employer] balance while stepping or standing    Standing on One Leg Tries to lift leg/unable to hold 3 seconds but remains standing independently    Total Score 43          Gait speed:  1.98 ft/sec 5x STS = 18.96 sec TUG score = 11.6 sec   PATIENT SURVEYS:  ABC scale:  The Activities-Specific Balance Confidence (ABC) Scale 0% 10 20 30  40 50 60 70 80 90 100% No confidence<->completely confident  "How confident are you that you will not lose your balance or become unsteady when you . . .   Date tested 09/13/2024  Walk around the house 70%  2. Walk up or down stairs 70%  3. Bend over and pick up a slipper from in front of a closet floor 80%  4. Reach for a small can off a shelf at eye level 100%  5. Stand on tip toes and reach for something above your head  40%  6. Stand on a chair and reach for something 0%  7. Sweep the floor 80%  8. Walk outside the house to a car parked in the driveway 80%  9. Get into or out of a car 100%  10. Walk across a parking lot to the mall 80%  11. Walk up or down a ramp 0%  12. Walk in a crowded mall where people rapidly walk past you 80%  13. Are bumped into by people as you walk through the mall 0%  14. Step onto or off of an escalator while you are holding onto the railing 80%  15. Step onto or off an escalator while holding onto parcels such that you cannot hold onto the railing 0%  16. Walk outside on icy sidewalks 0%  Total: #/16 780/1600 = 48%      TODAY'S TREATMENT:  09/13/24 SELF CARE: Provided education on PT POC progression.and initial HEP   PATIENT EDUCATION:  Education details: PT eval findings, anticipated POC, and initial HEP  Person educated: Patient Education method: Explanation, Demonstration, Verbal cues, Tactile cues, and Handouts Education comprehension: verbalized understanding, verbal cues required, tactile cues required, and needs further education  HOME EXERCISE PROGRAM: Access Code: [email or web address] URL: [email or web address] Date: 09/13/2024 Prepared by: Garnette Montclair  Exercises - Tandem Stance in Corner  - 1 x daily - 7 x weekly - 3 sets - 10 reps - Single Leg Stance  - 1 x daily - 7 x weekly - 3 sets - 10 reps - Heel Raises with Counter Support  - 1 x daily - 7 x weekly - 3 sets - 10 reps - Toe Raises with Counter Support  - 1 x daily - 7 x weekly - 3 sets - 10 reps   ASSESSMENT:  CLINICAL IMPRESSION: Derra Shartzer is a 83 y.o. female who was referred to physical therapy for evaluation and treatment for gait abnormality.  Patient presents with physical impairments of impaired activity tolerance, impaired standing balance, impaired ambulation, and decreased safety awareness impacting safe and independent functional mobility.  Examination revealed patient  is at risk for falls and functional decline as evidenced by the following objective test measures: Gait speed 1.98 ft/sec, (2.62 ft/sec is needed for community access), BERG score of 43/56;  TUG of 11.6 sec (>13.5 sec indicates increased risk for falls), and 5xSTS of 18.96 sec (>15 sec indicates increased risk for falls and decreased BLE power).  ABC scale score of 48% indicates a moderate level of physical functioning.  Dalphine will benefit from skilled PT to address above deficits to improve mobility and activity tolerance to help reach the maximal level of functional independence and mobility. Patient demonstrates understanding of this POC and is in agreement with this plan.   OBJECTIVE IMPAIRMENTS: difficulty walking, decreased strength, postural dysfunction, and pain.   ACTIVITY LIMITATIONS: carrying, lifting, bending, squatting, and locomotion level  PARTICIPATION LIMITATIONS: cleaning, laundry, shopping, and community activity  PERSONAL FACTORS: Age, Fitness, and 1-2 comorbidities: Afib, Diabetes, neuropathy, retinopathy, osteoporosis, h/o falls, h/o L breast CA  are also affecting patient's functional outcome.   REHAB POTENTIAL: Good  CLINICAL DECISION MAKING: Evolving/moderate complexity  EVALUATION COMPLEXITY: Moderate   GOALS: Goals reviewed with patient? Yes  SHORT TERM GOALS: Target date: 10/10/2024   Patient will be independent with initial HEP to improve outcomes and carryover.  Baseline: 100% PT assist required for correct completion Goal status: INITIAL  2.  Patient will be educated on strategies to decrease risk of falls.  Baseline: no education provided yet Goal status: INITIAL   LONG TERM GOALS: Target date: 11/08/2024   Patient will be independent with advanced/ongoing HEP to facilitate ability to maintain/progress functional gains from skilled physical therapy services. Baseline: no advanced HEP yet Goal status: INITIAL   3.  Patient will be able to step  up/down curb safely with LRAD for safety with community ambulation.  Baseline: TBD Goal status: INITIAL   4.  Patient will demonstrate improved  BLE strength to >/= 5/5 for improved stability and ease of mobility Baseline: Refer to above LE MMT table Goal status: INITIAL  5.  Patient will improve 5xSTS time to </= 14 seconds for improved efficiency and safety with transfers. Baseline: 18.96 sec Goal status: INITIAL   6.  Patient will demonstrate gait speed of >/= 2.62 ft/sec (0.55 m/s) to be a safe limited community ambulator with decreased risk for recurrent falls.  Baseline: 1.98 Goal status: INITIAL  7.  Patient will improve Berg score to >/= 51/56 to improve safety and stability with ADLs in standing and reduce risk for falls. (MCID= 8 points)  Baseline: 43 Goal status: INITIAL  8.  Patient will demonstrate at least 19/24 on DGI to improve gait stability and reduce risk for falls. Baseline: TBD Goal status: INITIAL  9. Patient will improve FGA score to at least 19/30 to improve gait stability and reduce risk for falls. Baseline: TBD Goal status: INITIAL  10.  Patient will report >/= 70% on ABC scale (MCID = 19%) to demonstrate improved balance confidence with functional mobility and gait. Baseline: 48% Goal status: INITIAL   PLAN:  PT FREQUENCY: 1-2x/week  PT DURATION: 8 weeks  PLANNED INTERVENTIONS: [id number]- PT Re-evaluation, 97750- Physical Performance Testing, 97110-Therapeutic exercises, 97530- Therapeutic activity, 97112- Neuromuscular re-education, 97535- Self Care, [id number]- Manual therapy, [phone]- Gait training, Patient/Family education, Balance training, and Stair training  PLAN FOR NEXT SESSION: see how HEP is going and work on sales executive activities   Accalia Rigdon, PT 09/13/2024, 9:12 PM

## 2024-09-13 ENCOUNTER — Encounter: Payer: Self-pay | Admitting: Rehabilitation

## 2024-09-13 ENCOUNTER — Other Ambulatory Visit: Payer: Self-pay

## 2024-09-13 ENCOUNTER — Ambulatory Visit: Attending: Family Medicine | Admitting: Rehabilitation

## 2024-09-13 DIAGNOSIS — M6281 Muscle weakness (generalized): Secondary | ICD-10-CM | POA: Diagnosis present

## 2024-09-13 DIAGNOSIS — R269 Unspecified abnormalities of gait and mobility: Secondary | ICD-10-CM | POA: Diagnosis not present

## 2024-09-13 DIAGNOSIS — R2689 Other abnormalities of gait and mobility: Secondary | ICD-10-CM | POA: Diagnosis not present

## 2024-09-13 DIAGNOSIS — R293 Abnormal posture: Secondary | ICD-10-CM | POA: Insufficient documentation

## 2024-09-13 DIAGNOSIS — R262 Difficulty in walking, not elsewhere classified: Secondary | ICD-10-CM | POA: Diagnosis present

## 2024-09-14 ENCOUNTER — Ambulatory Visit

## 2024-09-22 ENCOUNTER — Ambulatory Visit

## 2024-09-26 ENCOUNTER — Other Ambulatory Visit: Payer: Self-pay | Admitting: Cardiology

## 2024-09-27 ENCOUNTER — Ambulatory Visit: Admitting: Rehabilitation

## 2024-09-27 ENCOUNTER — Encounter: Payer: Self-pay | Admitting: Rehabilitation

## 2024-09-27 DIAGNOSIS — R262 Difficulty in walking, not elsewhere classified: Secondary | ICD-10-CM | POA: Diagnosis not present

## 2024-09-27 DIAGNOSIS — R293 Abnormal posture: Secondary | ICD-10-CM

## 2024-09-27 DIAGNOSIS — M6281 Muscle weakness (generalized): Secondary | ICD-10-CM

## 2024-09-27 NOTE — Therapy (Signed)
 OUTPATIENT PHYSICAL THERAPY NEURO EVALUATION   Patient Name: Siri Buege MRN: [id number] DOB:07-Jul-1941, 83 y.o., female Today's Date: 09/27/2024   END OF SESSION:  PT End of Session - 09/27/24 1006     Visit Number 2    Date for Recertification  11/08/24    PT Start Time 1010    PT Stop Time 1100    PT Time Calculation (min) 50 min    Activity Tolerance Patient tolerated treatment well;No increased pain    Behavior During Therapy [facility] for tasks assessed/performed          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   Essential hypertension 10/07/2016   Fall at home, initial encounter 04/05/2022   Family history of breast cancer    aunt   History of adenomatous polyp of colon 02/12/2021   HLD (hyperlipidemia) 08/17/2012   Hypercholesteremia 2000   Hypercholesterolemia 2000   Hypertension    history   Long term (current) use of anticoagulants 12/14/2021   Long term (current) use of insulin  (HCC) 02/12/2021   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   New onset a-fib (HCC) 06/28/2020   Osteoporosis 09/06/2016   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   Persistent atrial fibrillation (HCC) 06/07/2021   Personal history of malignant neoplasm of breast 02/12/2021   Personal history of radiation therapy    Postmenopausal    took HRT 1999 - 2004   Preop cardiovascular exam 07/03/2020   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   Traumatic ecchymosis of  right lower leg 04/05/2022   Unspecified atrial fibrillation (HCC) 07/03/2020   Past Surgical History:  Procedure Laterality Date   BREAST BIOPSY     BREAST BIOPSY Left 10/29/2022   MM LT BREAST BX W LOC DEV 1ST LESION IMAGE BX SPEC STEREO GUIDE 10/29/2022 [facility]   BREAST EXCISIONAL BIOPSY Left 09/07/2020   BREAST LUMPECTOMY  04/2009   rt breast  estrogen +, Her 2 Nu negative   BREAST LUMPECTOMY WITH RADIOACTIVE SEED LOCALIZATION Left 09/07/2020   Procedure: RADIOCATIVE SEED GUIDED LEFT BREAST LUMPECTOMY;  Surgeon: Ethyl Lenis, MD;  Location: [facility];  Service: General;  Laterality: Left;   COLONOSCOPY WITH PROPOFOL  N/A 04/05/2014   Procedure: COLONOSCOPY WITH PROPOFOL ;  Surgeon: Gladis MARLA Louder, MD;  Location: [facility];  Service: Endoscopy;  Laterality: N/A;   ORIF FOOT FRACTURE Right 04/20/2014   done in [facility] TOOTH EXTRACTION     Patient Active Problem List   Diagnosis Date Noted   Nuclear sclerotic cataract of left eye 02/26/2024   Osteopenia of left forearm 12/09/2023   Well woman exam with routine gynecological exam 12/04/2023   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   Acute blood loss anemia 04/05/2022   Traumatic ecchymosis of right lower leg 04/05/2022   Fall at home, initial encounter 04/05/2022   Long term (current) use of anticoagulants 12/14/2021   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   Coronary arteriosclerosis 09/25/2021   Persistent atrial fibrillation (HCC) 06/07/2021   Diabetic peripheral neuropathy associated  with type 2 diabetes mellitus (HCC) 02/12/2021   Diabetic retinopathy associated with type 2 diabetes mellitus (HCC) 02/12/2021   History of adenomatous polyp of colon 02/12/2021   Long term (current) use of insulin  (HCC) 02/12/2021   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   Personal history of malignant neoplasm of breast 02/12/2021   Atrial fibrillation (HCC) 02/12/2021   Family history of breast cancer     Hypertension    Personal history of radiation therapy    Postmenopausal    Diabetes mellitus due to underlying condition with unspecified complications (HCC) 07/03/2020   Unspecified atrial fibrillation (HCC) 07/03/2020   New onset a-fib (HCC) 06/28/2020   Essential hypertension 10/07/2016   Osteoporosis 09/06/2016   Macular degeneration 2016   HLD (hyperlipidemia) 08/17/2012   Diabetes mellitus, type 2 (HCC) 01/27/2012   Breast CA (HCC) 12/20/2011   Hypercholesterolemia 2000   Hypercholesteremia 2000    PCP: Frann Mabel Mt, DO   REFERRING PROVIDER: Frann Mabel Mt*   REFERRING DIAG: R26.9 (ICD-10-CM) - Gait abnormality  THERAPY DIAG:  Difficulty in walking, not elsewhere classified  Muscle weakness (generalized)  Abnormal posture  RATIONALE FOR EVALUATION AND TREATMENT: Rehabilitation  ONSET DATE: >1 year, but worsening progressively over last few months  NEXT MD VISIT:    SUBJECTIVE:                                                                                                                                                                                                         SUBJECTIVE STATEMENT: Patient states hasn't been able to do her HEP because she had an eye procedure and was told not to do any exertion for a couple of weeks.   Denies any falls or med chnages  83 y/o female referred to PT from PCP for gait abnormality.   She is also having some memory deficits and brain MRI is pending.  She is also supposed to see a neurologist  Pt accompanied by: self  PAIN: Are you having pain? No  PERTINENT HISTORY:  Afib, Diabetes, neuropathy, retinopathy, osteoporosis, h/o falls, h/o L breast CA   PRECAUTIONS: Other: L breast surgery/lumpectomy  RED FLAGS: None  WEIGHT BEARING RESTRICTIONS: No  FALLS:  Has patient fallen in last 6 months? Yes. Number of falls several  LIVING ENVIRONMENT: Lives with: lives alone Lives in:  House/apartment Stairs: Yes: Internal: 14 steps; on right going up and External: 6 steps; bilateral but cannot reach both Has following equipment at home: Single point cane, Walker - 2 wheeled, bed side commode, and Grab bars  OCCUPATION: works for [employer]  PLOF: Independent with gait  PATIENT GOALS: have better balance   OBJECTIVE: (objective measures completed at initial evaluation unless otherwise dated)  DIAGNOSTIC FINDINGS:  Brain MRI is pending  COGNITION: Overall cognitive status: Within functional limits for tasks assessed   SENSATION: WFL  COORDINATION: WNL  EDEMA:  Mild BLE  MUSCLE TONE: WNL  DTRs:  NT  POSTURE:  rounded shoulders and forward head     LOWER EXTREMITY ROM:    MMT Right eval Left eval  Hip flexion    Hip extension    Hip abduction    Hip adduction    Hip internal rotation    Hip external rotation    Knee flexion    Knee extension    Ankle dorsiflexion    Ankle plantarflexion    Ankle inversion    Ankle eversion    (Blank rows = not tested) LOWER EXTREMITY MMT:     Active  Right eval Left eval  Hip flexion 4 4  Hip extension    Hip abduction 4 4  Hip adduction    Hip internal rotation 4+ 4  Hip external rotation 4 4+  Knee flexion 5 5  Knee extension 5 5  Ankle dorsiflexion 4 4-  Ankle plantarflexion    Ankle inversion    Ankle eversion     (Blank rows = not tested) BED MOBILITY:  Rolling to Right CGA Rolling to Left Complete Independence  TRANSFERS: Assistive device utilized: None  Sit to stand: Modified independence Stand to sit: Modified independence Chair to chair: Modified independence Floor: NT  GAIT: Distance walked:  clinic distances and into clinic from parking lot Assistive device utilized: None Level of assistance: SBA Gait pattern: decreased step length- Right, decreased step length- Left, decreased stance time- Right, decreased stance time- Left, decreased ankle dorsiflexion- Right,  and decreased ankle dorsiflexion- Left Comments:   FUNCTIONAL TESTS:  PHYSICAL PERFORMANCE TEST or MEASUREMENT:     Gait speed:  1.98 ft/sec 5x STS = 18.96 sec TUG score = 11.6 sec   PATIENT SURVEYS:  ABC scale: The Activities-Specific Balance Confidence (ABC) Scale 0% 10 20 30  40 50 60 70 80 90 100% No confidence<->completely confident  "How confident are you that you will not lose your balance or become unsteady when you . . .   Date tested 09/27/2024  Walk around the house 70%  2. Walk up or down stairs 70%  3. Bend over and pick up a slipper from in front of a closet floor 80%  4. Reach for a small can off a shelf at eye level 100%  5. Stand on tip toes and reach for something above your head 40%  6. Stand on a chair and reach for something 0%  7. Sweep the floor 80%  8. Walk outside the house to a car parked in the driveway 80%  9. Get into or out of a car 100%  10. Walk across a parking lot to the mall 80%  11. Walk up or down a ramp 0%  12. Walk in a crowded mall where people rapidly walk past you 80%  13. Are bumped into by people as you walk through the mall 0%  14. Step onto or off of an escalator while you are holding onto the railing 8011%  15. Step onto or off an escalator while holding onto parcels such that you cannot hold onto the railing 0%  16. Walk outside on icy  sidewalks 0%  Total: #/16 780/1600 = 48%      TODAY'S TREATMENT:  09/27/24 THERAPEUTIC EXERCISE: To improve strength and endurance.  Demonstration, verbal and tactile cues throughout for technique. NuStep L4 x 6'   NEUROMUSCULAR RE-EDUCATION: To improve balance, coordination, kinesthesia, and posture. Corner balance:  Toe raises x 20  Heel raises x 20  Marching x 20 BLE  EC stance x 1'   Tandem stance x 1' BLE  Step stance EO x 1' each leg;  EC x 1' ea leg  Theraband foam standing heel and toes off the edges EO x 1'; EC x 1' Tandem gait at counter F/B X 3 LAPS Standing rhythmic  stabilization w/ black TB around waist pulling in all directions for challenges Seated on 75 cm swiss ball:  Bouncing EO x 1';  EC x 1;  Rocking F/B x 20;  S/S x 20  Pelvic circles x 20 CW;  x20 CCW  Alternate marching x 10  B shoulder flexion x 10  Alternate LAQ x 10  09/13/24 SELF CARE: Provided education on PT POC progression.and initial HEP   PATIENT EDUCATION:  Education details: HEP review  Person educated: Patient Education method: Explanation, Demonstration, Verbal cues, Tactile cues, and Handouts Education comprehension: verbalized understanding, verbal cues required, tactile cues required, and needs further education  HOME EXERCISE PROGRAM: Access Code: [email or web address] URL: [email or web address] Date: 09/13/2024 Prepared by: Garnette Montclair  Exercises - Tandem Stance in Corner  - 1 x daily - 7 x weekly - 3 sets - 10 reps - Single Leg Stance  - 1 x daily - 7 x weekly - 3 sets - 10 reps - Heel Raises with Counter Support  - 1 x daily - 7 x weekly - 3 sets - 10 reps - Toe Raises with Counter Support  - 1 x daily - 7 x weekly - 3 sets - 10 reps   ASSESSMENT:  CLINICAL IMPRESSION: Patient is able to progress with higher level balance activities today.  She does well but has slowed reaction time to perturbations and challenges to posterior and lateral balance.  She needs further training with this.  HEP is left unchanged and patient is to continue with current HEP for now.   Will progress HEP slowly so that she can remember and be compliant with it.   PT remains necessary for balance and gait deficits.   Continue per POC  EVAL:  Waylon Koffler is a 83 y.o. female who was referred to physical therapy for evaluation and treatment for gait abnormality.  Patient presents with physical impairments of impaired activity tolerance, impaired standing balance, impaired ambulation, and decreased safety awareness impacting safe and independent functional mobility.   Examination revealed patient is at risk for falls and functional decline as evidenced by the following objective test measures: Gait speed 1.98 ft/sec, (2.62 ft/sec is needed for community access), BERG score of 43/56;  TUG of 11.6 sec (>13.5 sec indicates increased risk for falls), and 5xSTS of 18.96 sec (>15 sec indicates increased risk for falls and decreased BLE power).  ABC scale score of 48% indicates a moderate level of physical functioning.  Mayar will benefit from skilled PT to address above deficits to improve mobility and activity tolerance to help reach the maximal level of functional independence and mobility. Patient demonstrates understanding of this POC and is in agreement with this plan.   OBJECTIVE IMPAIRMENTS: difficulty walking, decreased strength, postural dysfunction, and pain.   ACTIVITY LIMITATIONS: carrying, lifting, bending,  squatting, and locomotion level  PARTICIPATION LIMITATIONS: cleaning, laundry, shopping, and community activity  PERSONAL FACTORS: Age, Fitness, and 1-2 comorbidities: Afib, Diabetes, neuropathy, retinopathy, osteoporosis, h/o falls, h/o L breast CA  are also affecting patient's functional outcome.   REHAB POTENTIAL: Good  CLINICAL DECISION MAKING: Evolving/moderate complexity  EVALUATION COMPLEXITY: Moderate   GOALS: Goals reviewed with patient? Yes  SHORT TERM GOALS: Target date: 10/10/2024   Patient will be independent with initial HEP to improve outcomes and carryover.  Baseline: 100% PT assist required for correct completion 09/27/24:  reviewed HEP and assist needed for recall and performance Goal status: IN PROGRESS  2.  Patient will be educated on strategies to decrease risk of falls.  Baseline: no education provided yet 09/27/24:  provided falls handout in [facility] Goal status: IN PROGRESS   LONG TERM GOALS: Target date: 11/08/2024   Patient will be independent with advanced/ongoing HEP to facilitate ability to  maintain/progress functional gains from skilled physical therapy services. Baseline: no advanced HEP yet Goal status: INITIAL   3.  Patient will be able to step up/down curb safely with LRAD for safety with community ambulation.  Baseline: TBD Goal status: INITIAL   4.  Patient will demonstrate improved  BLE strength to >/= 5/5 for improved stability and ease of mobility Baseline: Refer to above LE MMT table Goal status: INITIAL  5.  Patient will improve 5xSTS time to </= 14 seconds for improved efficiency and safety with transfers. Baseline: 18.96 sec Goal status: INITIAL   6.  Patient will demonstrate gait speed of >/= 2.62 ft/sec (0.55 m/s) to be a safe limited community ambulator with decreased risk for recurrent falls.  Baseline: 1.98 Goal status: INITIAL  7.  Patient will improve Berg score to >/= 51/56 to improve safety and stability with ADLs in standing and reduce risk for falls. (MCID= 8 points)  Baseline: 43 Goal status: INITIAL  8.  Patient will demonstrate at least 19/24 on DGI to improve gait stability and reduce risk for falls. Baseline: TBD Goal status: INITIAL  9. Patient will improve FGA score to at least 19/30 to improve gait stability and reduce risk for falls. Baseline: TBD Goal status: INITIAL  10.  Patient will report >/= 70% on ABC scale (MCID = 19%) to demonstrate improved balance confidence with functional mobility and gait. Baseline: 48% Goal status: INITIAL   PLAN:  PT FREQUENCY: 1-2x/week  PT DURATION: 8 weeks  PLANNED INTERVENTIONS: [id number]- PT Re-evaluation, 97750- Physical Performance Testing, 97110-Therapeutic exercises, 97530- Therapeutic activity, [id number]- Neuromuscular re-education, 97535- Self Care, [id number]- Manual therapy, [phone]- Gait training, Patient/Family education, Balance training, and Stair training  PLAN FOR NEXT SESSION: do steps; continue to work on static and dynamic balance   Lenor Provencher, PT 09/27/2024, 7:21 PM

## 2024-09-30 ENCOUNTER — Ambulatory Visit

## 2024-09-30 DIAGNOSIS — R262 Difficulty in walking, not elsewhere classified: Secondary | ICD-10-CM | POA: Diagnosis not present

## 2024-09-30 DIAGNOSIS — R293 Abnormal posture: Secondary | ICD-10-CM

## 2024-09-30 DIAGNOSIS — M6281 Muscle weakness (generalized): Secondary | ICD-10-CM

## 2024-09-30 NOTE — Therapy (Signed)
 OUTPATIENT PHYSICAL THERAPY NEURO TREATMENT   Patient Name: Vickie Burns MRN: [id number] DOB:1941-01-29, 83 y.o., female Today's Date: 09/30/2024   END OF SESSION:  PT End of Session - 09/30/24 1105     Visit Number 3    Date for Recertification  11/08/24    PT Start Time 1019    PT Stop Time 1102    PT Time Calculation (min) 43 min    Activity Tolerance Patient tolerated treatment well;No increased pain    Behavior During Therapy [facility] for tasks assessed/performed           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   Essential hypertension 10/07/2016   Fall at home, initial encounter 04/05/2022   Family history of breast cancer    aunt   History of adenomatous polyp of colon 02/12/2021   HLD (hyperlipidemia) 08/17/2012   Hypercholesteremia 2000   Hypercholesterolemia 2000   Hypertension    history   Long term (current) use of anticoagulants 12/14/2021   Long term (current) use of insulin  (HCC) 02/12/2021   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   New onset a-fib (HCC) 06/28/2020   Osteoporosis 09/06/2016   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   Persistent atrial fibrillation (HCC) 06/07/2021   Personal history of malignant neoplasm of breast 02/12/2021   Personal history of radiation therapy    Postmenopausal    took HRT 1999 - 2004   Preop cardiovascular exam 07/03/2020   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   Traumatic ecchymosis of  right lower leg 04/05/2022   Unspecified atrial fibrillation (HCC) 07/03/2020   Past Surgical History:  Procedure Laterality Date   BREAST BIOPSY     BREAST BIOPSY Left 10/29/2022   MM LT BREAST BX W LOC DEV 1ST LESION IMAGE BX SPEC STEREO GUIDE 10/29/2022 [facility]   BREAST EXCISIONAL BIOPSY Left 09/07/2020   BREAST LUMPECTOMY  04/2009   rt breast  estrogen +, Her 2 Nu negative   BREAST LUMPECTOMY WITH RADIOACTIVE SEED LOCALIZATION Left 09/07/2020   Procedure: RADIOCATIVE SEED GUIDED LEFT BREAST LUMPECTOMY;  Surgeon: Ethyl Lenis, MD;  Location: [facility];  Service: General;  Laterality: Left;   COLONOSCOPY WITH PROPOFOL  N/A 04/05/2014   Procedure: COLONOSCOPY WITH PROPOFOL ;  Surgeon: Gladis MARLA Louder, MD;  Location: [facility];  Service: Endoscopy;  Laterality: N/A;   ORIF FOOT FRACTURE Right 04/20/2014   done in [facility] TOOTH EXTRACTION     Patient Active Problem List   Diagnosis Date Noted   Nuclear sclerotic cataract of left eye 02/26/2024   Osteopenia of left forearm 12/09/2023   Well woman exam with routine gynecological exam 12/04/2023   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   Acute blood loss anemia 04/05/2022   Traumatic ecchymosis of right lower leg 04/05/2022   Fall at home, initial encounter 04/05/2022   Long term (current) use of anticoagulants 12/14/2021   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   Coronary arteriosclerosis 09/25/2021   Persistent atrial fibrillation (HCC) 06/07/2021   Diabetic peripheral neuropathy  associated with type 2 diabetes mellitus (HCC) 02/12/2021   Diabetic retinopathy associated with type 2 diabetes mellitus (HCC) 02/12/2021   History of adenomatous polyp of colon 02/12/2021   Long term (current) use of insulin  (HCC) 02/12/2021   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   Personal history of malignant neoplasm of breast 02/12/2021   Atrial fibrillation (HCC) 02/12/2021   Family history of breast cancer     Hypertension    Personal history of radiation therapy    Postmenopausal    Diabetes mellitus due to underlying condition with unspecified complications (HCC) 07/03/2020   Unspecified atrial fibrillation (HCC) 07/03/2020   New onset a-fib (HCC) 06/28/2020   Essential hypertension 10/07/2016   Osteoporosis 09/06/2016   Macular degeneration 2016   HLD (hyperlipidemia) 08/17/2012   Diabetes mellitus, type 2 (HCC) 01/27/2012   Breast CA (HCC) 12/20/2011   Hypercholesterolemia 2000   Hypercholesteremia 2000    PCP: Frann Mabel Mt, DO   REFERRING PROVIDER: Frann Mabel Mt*   REFERRING DIAG: R26.9 (ICD-10-CM) - Gait abnormality  THERAPY DIAG:  Difficulty in walking, not elsewhere classified  Muscle weakness (generalized)  Abnormal posture  RATIONALE FOR EVALUATION AND TREATMENT: Rehabilitation  ONSET DATE: >1 year, but worsening progressively over last few months  NEXT MD VISIT:    SUBJECTIVE:                                                                                                                                                                                                         SUBJECTIVE STATEMENT: Not doing HEP like she should.  83 y/o female referred to PT from PCP for gait abnormality.   She is also having some memory deficits and brain MRI is pending.  She is also supposed to see a neurologist  Pt accompanied by: self  PAIN: Are you having pain? No  PERTINENT HISTORY:  Afib, Diabetes, neuropathy, retinopathy, osteoporosis, h/o falls, h/o L breast CA   PRECAUTIONS: Other: L breast surgery/lumpectomy  RED FLAGS: None  WEIGHT BEARING RESTRICTIONS: No  FALLS:  Has patient fallen in last 6 months? Yes. Number of falls several  LIVING ENVIRONMENT: Lives with: lives alone Lives in: House/apartment Stairs: Yes: Internal: 14 steps; on right going up and External: 6 steps; bilateral but cannot reach both Has following equipment at  home: Single point cane, Walker - 2 wheeled, bed side commode, and Grab bars  OCCUPATION: works for board of [employer]  PLOF: Independent with gait  PATIENT GOALS: have better balance   OBJECTIVE: (objective measures completed at initial evaluation unless otherwise dated)  DIAGNOSTIC FINDINGS:  Brain MRI is pending  COGNITION: Overall cognitive status: Within functional limits for tasks assessed   SENSATION: WFL  COORDINATION: WNL  EDEMA:  Mild BLE  MUSCLE TONE: WNL  DTRs:  NT  POSTURE:  rounded shoulders and forward head     LOWER EXTREMITY ROM:    MMT Right eval Left eval  Hip flexion    Hip extension    Hip abduction    Hip adduction    Hip internal rotation    Hip external rotation    Knee flexion    Knee extension    Ankle dorsiflexion    Ankle plantarflexion    Ankle inversion    Ankle eversion    (Blank rows = not tested) LOWER EXTREMITY MMT:     Active  Right eval Left eval  Hip flexion 4 4  Hip extension    Hip abduction 4 4  Hip adduction    Hip internal rotation 4+ 4  Hip external rotation 4 4+  Knee flexion 5 5  Knee extension 5 5  Ankle dorsiflexion 4 4-  Ankle plantarflexion    Ankle inversion    Ankle eversion     (Blank rows = not tested) BED MOBILITY:  Rolling to Right CGA Rolling to Left Complete Independence  TRANSFERS: Assistive device utilized: None  Sit to stand: Modified independence Stand to sit: Modified independence Chair to chair: Modified independence Floor: NT  GAIT: Distance walked:  clinic distances and into clinic from parking lot Assistive device utilized: None Level of assistance: SBA Gait pattern: decreased step length- Right, decreased step length- Left, decreased stance time- Right, decreased stance time- Left, decreased ankle dorsiflexion- Right, and decreased ankle dorsiflexion- Left Comments:   FUNCTIONAL TESTS:  PHYSICAL PERFORMANCE TEST or MEASUREMENT:     Gait speed:  1.98  ft/sec 5x STS = 18.96 sec TUG score = 11.6 sec   PATIENT SURVEYS:  ABC scale: The Activities-Specific Balance Confidence (ABC) Scale 0% 10 20 30  40 50 60 70 80 90 100% No confidence<->completely confident  "How confident are you that you will not lose your balance or become unsteady when you . . .   Date tested 09/30/2024  Walk around the house 70%  2. Walk up or down stairs 70%  3. Bend over and pick up a slipper from in front of a closet floor 80%  4. Reach for a small can off a shelf at eye level 100%  5. Stand on tip toes and reach for something above your head 40%  6. Stand on a chair and reach for something 0%  7. Sweep the floor 80%  8. Walk outside the house to a car parked in the driveway 80%  9. Get into or out of a car 100%  10. Walk across a parking lot to the mall 80%  11. Walk up or down a ramp 0%  12. Walk in a crowded mall where people rapidly walk past you 80%  13. Are bumped into by people as you walk through the mall 0%  14. Step onto or off of an escalator while you are holding onto the railing 8011%  15. Step onto or off an escalator while holding onto parcels such that you cannot hold onto the railing 0%  16. Walk outside on icy sidewalks 0%  Total: #/16 780/1600 = 48%      TODAY'S TREATMENT:  09/30/24 Bike L2x75min Step ups BLE x 10 6 1HA Lateral step ups LLE  x 10 1HA; RLE 2HA x 10 Standing hip abduction 2lb 2 x 10 BLE Standing hip extension 2lb 2 x 10 BLE Standing marching 2lb 2 x 10 BLE Seated LAQ 2lb 2x10 BLE Seated SLR 2lb 2x10 BLE  09/27/24 THERAPEUTIC EXERCISE: To improve strength and endurance.  Demonstration, verbal and tactile cues throughout for technique. NuStep L4 x 6'   NEUROMUSCULAR RE-EDUCATION: To improve balance, coordination, kinesthesia, and posture. Corner balance:  Toe raises x 20  Heel raises x 20  Marching x 20 BLE  EC stance x 1'   Tandem stance x 1' BLE  Step stance EO x 1' each leg;  EC x 1' ea leg  Theraband foam  standing heel and toes off the edges EO x 1'; EC x 1' Tandem gait at counter F/B X 3 LAPS Standing rhythmic stabilization w/ black TB around waist pulling in all directions for challenges Seated on 75 cm swiss ball:  Bouncing EO x 1';  EC x 1;  Rocking F/B x 20;  S/S x 20  Pelvic circles x 20 CW;  x20 CCW  Alternate marching x 10  B shoulder flexion x 10  Alternate LAQ x 10  09/13/24 SELF CARE: Provided education on PT POC progression.and initial HEP   PATIENT EDUCATION:  Education details: HEP review  Person educated: Patient Education method: Explanation, Demonstration, Verbal cues, Tactile cues, and Handouts Education comprehension: verbalized understanding, verbal cues required, tactile cues required, and needs further education  HOME EXERCISE PROGRAM: Access Code: [email or web address] URL: [email or web address] Date: 09/13/2024 Prepared by: Garnette Montclair  Exercises - Tandem Stance in Corner  - 1 x daily - 7 x weekly - 3 sets - 10 reps - Single Leg Stance  - 1 x daily - 7 x weekly - 3 sets - 10 reps - Heel Raises with Counter Support  - 1 x daily - 7 x weekly - 3 sets - 10 reps - Toe Raises with Counter Support  - 1 x daily - 7 x weekly - 3 sets - 10 reps   ASSESSMENT:  CLINICAL IMPRESSION: Progressed with strengthening for hips and knee to improve stability and balance. Instructed through therapeutic activities to improve standing tolerance and functional performance. Greater weakness R LE vs LLE.  EVAL:  Jammie Troup is a 83 y.o. female who was referred to physical therapy for evaluation and treatment for gait abnormality.  Patient presents with physical impairments of impaired activity tolerance, impaired standing balance, impaired ambulation, and decreased safety awareness impacting safe and independent functional mobility.  Examination revealed patient is at risk for falls and functional decline as evidenced by the following objective test measures: Gait  speed 1.98 ft/sec, (2.62 ft/sec is needed for community access), BERG score of 43/56;  TUG of 11.6 sec (>13.5 sec indicates increased risk for falls), and 5xSTS of 18.96 sec (>15 sec indicates increased risk for falls and decreased BLE power).  ABC scale score of 48% indicates a moderate level of physical functioning.  Rilynne will benefit from skilled PT to address above deficits to improve mobility and activity tolerance to help reach the maximal level of functional independence and mobility. Patient demonstrates understanding of this POC and is in agreement with this plan.   OBJECTIVE IMPAIRMENTS: difficulty walking, decreased strength, postural dysfunction, and pain.   ACTIVITY LIMITATIONS: carrying, lifting, bending, squatting, and locomotion level  PARTICIPATION LIMITATIONS: cleaning, laundry, shopping, and community activity  PERSONAL FACTORS: Age, Fitness, and 1-2 comorbidities: Afib, Diabetes, neuropathy, retinopathy, osteoporosis, h/o falls,  h/o L breast CA  are also affecting patient's functional outcome.   REHAB POTENTIAL: Good  CLINICAL DECISION MAKING: Evolving/moderate complexity  EVALUATION COMPLEXITY: Moderate   GOALS: Goals reviewed with patient? Yes  SHORT TERM GOALS: Target date: 10/10/2024   Patient will be independent with initial HEP to improve outcomes and carryover.  Baseline: 100% PT assist required for correct completion 09/27/24:  reviewed HEP and assist needed for recall and performance Goal status: IN PROGRESS  2.  Patient will be educated on strategies to decrease risk of falls.  Baseline: no education provided yet 09/27/24:  provided falls handout in [facility] Goal status: IN PROGRESS   LONG TERM GOALS: Target date: 11/08/2024   Patient will be independent with advanced/ongoing HEP to facilitate ability to maintain/progress functional gains from skilled physical therapy services. Baseline: no advanced HEP yet Goal status: INITIAL   3.  Patient  will be able to step up/down curb safely with LRAD for safety with community ambulation.  Baseline: TBD Goal status: INITIAL   4.  Patient will demonstrate improved  BLE strength to >/= 5/5 for improved stability and ease of mobility Baseline: Refer to above LE MMT table Goal status: INITIAL  5.  Patient will improve 5xSTS time to </= 14 seconds for improved efficiency and safety with transfers. Baseline: 18.96 sec Goal status: INITIAL   6.  Patient will demonstrate gait speed of >/= 2.62 ft/sec (0.55 m/s) to be a safe limited community ambulator with decreased risk for recurrent falls.  Baseline: 1.98 Goal status: INITIAL  7.  Patient will improve Berg score to >/= 51/56 to improve safety and stability with ADLs in standing and reduce risk for falls. (MCID= 8 points)  Baseline: 43 Goal status: INITIAL  8.  Patient will demonstrate at least 19/24 on DGI to improve gait stability and reduce risk for falls. Baseline: TBD Goal status: INITIAL  9. Patient will improve FGA score to at least 19/30 to improve gait stability and reduce risk for falls. Baseline: TBD Goal status: INITIAL  10.  Patient will report >/= 70% on ABC scale (MCID = 19%) to demonstrate improved balance confidence with functional mobility and gait. Baseline: 48% Goal status: INITIAL   PLAN:  PT FREQUENCY: 1-2x/week  PT DURATION: 8 weeks  PLANNED INTERVENTIONS: [id number]- PT Re-evaluation, 97750- Physical Performance Testing, 97110-Therapeutic exercises, 97530- Therapeutic activity, 97112- Neuromuscular re-education, 97535- Self Care, [id number]- Manual therapy, [phone]- Gait training, Patient/Family education, Balance training, and Stair training  PLAN FOR NEXT SESSION: do steps; continue to work on static and dynamic balance   Sol LITTIE Gaskins, PTA 09/30/2024, 11:30 AM

## 2024-10-11 ENCOUNTER — Ambulatory Visit: Attending: Family Medicine

## 2024-10-11 DIAGNOSIS — M6281 Muscle weakness (generalized): Secondary | ICD-10-CM | POA: Insufficient documentation

## 2024-10-11 DIAGNOSIS — R293 Abnormal posture: Secondary | ICD-10-CM | POA: Insufficient documentation

## 2024-10-11 DIAGNOSIS — R262 Difficulty in walking, not elsewhere classified: Secondary | ICD-10-CM | POA: Insufficient documentation

## 2024-10-11 NOTE — Therapy (Signed)
 OUTPATIENT PHYSICAL THERAPY NEURO TREATMENT   Patient Name: Vickie Burns MRN: [id number] DOB:[birth year], 83 y.o., female Today's Date: 10/11/2024   END OF SESSION:  PT End of Session - 10/11/24 1055     Visit Number 4    Date for Recertification  11/08/24    PT Start Time 1018    PT Stop Time 1100    PT Time Calculation (min) 42 min    Activity Tolerance Patient tolerated treatment well;No increased pain    Behavior During Therapy [facility] for tasks assessed/performed            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   Essential hypertension 10/07/2016   Fall at home, initial encounter 04/05/2022   Family history of breast cancer    aunt   History of adenomatous polyp of colon 02/12/2021   HLD (hyperlipidemia) 08/17/2012   Hypercholesteremia 2000   Hypercholesterolemia 2000   Hypertension    history   Long term (current) use of anticoagulants 12/14/2021   Long term (current) use of insulin  (HCC) 02/12/2021   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   New onset a-fib (HCC) 06/28/2020   Osteoporosis 09/06/2016   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   Persistent atrial fibrillation (HCC) 06/07/2021   Personal history of malignant neoplasm of breast 02/12/2021   Personal history of radiation therapy    Postmenopausal    took HRT 1999 - 2004   Preop cardiovascular exam 07/03/2020   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   Traumatic ecchymosis of  right lower leg 04/05/2022   Unspecified atrial fibrillation (HCC) 07/03/2020   Past Surgical History:  Procedure Laterality Date   BREAST BIOPSY     BREAST BIOPSY Left 10/29/2022   MM LT BREAST BX W LOC DEV 1ST LESION IMAGE BX SPEC STEREO GUIDE 10/29/2022 [facility]   BREAST EXCISIONAL BIOPSY Left 09/07/2020   BREAST LUMPECTOMY  04/2009   rt breast  estrogen +, Her 2 Nu negative   BREAST LUMPECTOMY WITH RADIOACTIVE SEED LOCALIZATION Left 09/07/2020   Procedure: RADIOCATIVE SEED GUIDED LEFT BREAST LUMPECTOMY;  Surgeon: Ethyl Lenis, MD;  Location: [facility];  Service: General;  Laterality: Left;   COLONOSCOPY WITH PROPOFOL  N/A 04/05/2014   Procedure: COLONOSCOPY WITH PROPOFOL ;  Surgeon: Gladis MARLA Louder, MD;  Location: [facility];  Service: Endoscopy;  Laterality: N/A;   ORIF FOOT FRACTURE Right 04/20/2014   done in [facility] TOOTH EXTRACTION     Patient Active Problem List   Diagnosis Date Noted   Nuclear sclerotic cataract of left eye 02/26/2024   Osteopenia of left forearm 12/09/2023   Well woman exam with routine gynecological exam 12/04/2023   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   Acute blood loss anemia 04/05/2022   Traumatic ecchymosis of right lower leg 04/05/2022   Fall at home, initial encounter 04/05/2022   Long term (current) use of anticoagulants 12/14/2021   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   Coronary arteriosclerosis 09/25/2021   Persistent atrial fibrillation (HCC) 06/07/2021   Diabetic peripheral  neuropathy associated with type 2 diabetes mellitus (HCC) 02/12/2021   Diabetic retinopathy associated with type 2 diabetes mellitus (HCC) 02/12/2021   History of adenomatous polyp of colon 02/12/2021   Long term (current) use of insulin  (HCC) 02/12/2021   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   Personal history of malignant neoplasm of breast 02/12/2021   Atrial fibrillation (HCC) 02/12/2021   Family history of breast cancer     Hypertension    Personal history of radiation therapy    Postmenopausal    Diabetes mellitus due to underlying condition with unspecified complications (HCC) 07/03/2020   Unspecified atrial fibrillation (HCC) 07/03/2020   New onset a-fib (HCC) 06/28/2020   Essential hypertension 10/07/2016   Osteoporosis 09/06/2016   Macular degeneration 2016   HLD (hyperlipidemia) 08/17/2012   Diabetes mellitus, type 2 (HCC) 01/27/2012   Breast CA (HCC) 12/20/2011   Hypercholesterolemia 2000   Hypercholesteremia 2000    PCP: Frann Mabel Mt, DO   REFERRING PROVIDER: Frann Mabel Mt*   REFERRING DIAG: R26.9 (ICD-10-CM) - Gait abnormality  THERAPY DIAG:  Difficulty in walking, not elsewhere classified  Muscle weakness (generalized)  Abnormal posture  RATIONALE FOR EVALUATION AND TREATMENT: Rehabilitation  ONSET DATE: >1 year, but worsening progressively over last few months  NEXT MD VISIT:    SUBJECTIVE:                                                                                                                                                                                                         SUBJECTIVE STATEMENT:   83 y/o female referred to PT from PCP for gait abnormality.   She is also having some memory deficits and brain MRI is pending.  She is also supposed to see a neurologist  Pt accompanied by: self  PAIN: Are you having pain? No  PERTINENT HISTORY:  Afib, Diabetes, neuropathy, retinopathy, osteoporosis, h/o falls, h/o L breast CA   PRECAUTIONS: Other: L breast surgery/lumpectomy  RED FLAGS: None  WEIGHT BEARING RESTRICTIONS: No  FALLS:  Has patient fallen in last 6 months? Yes. Number of falls several  LIVING ENVIRONMENT: Lives with: lives alone Lives in: House/apartment Stairs: Yes: Internal: 14 steps; on right going up and External: 6 steps; bilateral but cannot reach both Has following equipment at home: Single point cane, Walker - 2  wheeled, bed side commode, and Grab bars  OCCUPATION: works for [employer]  PLOF: Independent with gait  PATIENT GOALS: have better balance   OBJECTIVE: (objective measures completed at initial evaluation unless otherwise dated)  DIAGNOSTIC FINDINGS:  Brain MRI is pending  COGNITION: Overall cognitive status: Within functional limits for tasks assessed   SENSATION: WFL  COORDINATION: WNL  EDEMA:  Mild BLE  MUSCLE TONE: WNL  DTRs:  NT  POSTURE:  rounded shoulders and forward head     LOWER EXTREMITY ROM:    MMT Right eval Left eval  Hip flexion    Hip extension    Hip abduction    Hip adduction    Hip internal rotation    Hip external rotation    Knee flexion    Knee extension    Ankle dorsiflexion    Ankle plantarflexion    Ankle inversion    Ankle eversion    (Blank rows = not tested) LOWER EXTREMITY MMT:     Active  Right eval Left eval  Hip flexion 4 4  Hip extension    Hip abduction 4 4  Hip adduction    Hip internal rotation 4+ 4  Hip external rotation 4 4+  Knee flexion 5 5  Knee extension 5 5  Ankle dorsiflexion 4 4-  Ankle plantarflexion    Ankle inversion    Ankle eversion     (Blank rows = not tested) BED MOBILITY:  Rolling to Right CGA Rolling to Left Complete Independence  TRANSFERS: Assistive device utilized: None  Sit to stand: Modified independence Stand to sit: Modified independence Chair to chair: Modified independence Floor: NT  GAIT: Distance walked:  clinic distances and into clinic from parking lot Assistive device utilized: None Level of assistance: SBA Gait pattern: decreased step length- Right, decreased step length- Left, decreased stance time- Right, decreased stance time- Left, decreased ankle dorsiflexion- Right, and decreased ankle dorsiflexion- Left Comments:   FUNCTIONAL TESTS:  PHYSICAL PERFORMANCE TEST or MEASUREMENT:     Gait speed:  1.98 ft/sec 5x STS = 18.96 sec TUG score = 11.6  sec   PATIENT SURVEYS:  ABC scale: The Activities-Specific Balance Confidence (ABC) Scale 0% 10 20 30  40 50 60 70 80 90 100% No confidence<->completely confident  "How confident are you that you will not lose your balance or become unsteady when you . . .   Date tested 10/11/2024  Walk around the house 70%  2. Walk up or down stairs 70%  3. Bend over and pick up a slipper from in front of a closet floor 80%  4. Reach for a small can off a shelf at eye level 100%  5. Stand on tip toes and reach for something above your head 40%  6. Stand on a chair and reach for something 0%  7. Sweep the floor 80%  8. Walk outside the house to a car parked in the driveway 80%  9. Get into or out of a car 100%  10. Walk across a parking lot to the mall 80%  11. Walk up or down a ramp 0%  12. Walk in a crowded mall where people rapidly walk past you 80%  13. Are bumped into by people as you walk through the mall 0%  14. Step onto or off of an escalator while you are holding onto the railing 8011%  15. Step onto or off an escalator while holding onto parcels such that you cannot hold onto the railing 0%  16. Walk outside on icy sidewalks 0%  Total: #/16 780/1600 = 48%      TODAY'S TREATMENT:  10/11/24 Bike L2x75min Leg curls 15lb x 20 BLE Leg extension 5lb x 20 BLE Step ups  BLE 2 x 10 6 1HA Educated on preventing falls risk and provided handout Tandem gait along counter 4x with support; 4x no support Braiding backward along counter 2x with support; 2x no support  09/30/24 Bike L2x79min Step ups BLE x 10 6 1HA Lateral step ups LLE x 10 1HA; RLE 2HA x 10 Standing hip abduction 2lb 2 x 10 BLE Standing hip extension 2lb 2 x 10 BLE Standing marching 2lb 2 x 10 BLE Seated LAQ 2lb 2x10 BLE Seated SLR 2lb 2x10 BLE  09/27/24 THERAPEUTIC EXERCISE: To improve strength and endurance.  Demonstration, verbal and tactile cues throughout for technique. NuStep L4 x 6'   NEUROMUSCULAR RE-EDUCATION: To  improve balance, coordination, kinesthesia, and posture. Corner balance:  Toe raises x 20  Heel raises x 20  Marching x 20 BLE  EC stance x 1'   Tandem stance x 1' BLE  Step stance EO x 1' each leg;  EC x 1' ea leg  Theraband foam standing heel and toes off the edges EO x 1'; EC x 1' Tandem gait at counter F/B X 3 LAPS Standing rhythmic stabilization w/ black TB around waist pulling in all directions for challenges Seated on 75 cm swiss ball:  Bouncing EO x 1';  EC x 1;  Rocking F/B x 20;  S/S x 20  Pelvic circles x 20 CW;  x20 CCW  Alternate marching x 10  B shoulder flexion x 10  Alternate LAQ x 10  09/13/24 SELF CARE: Provided education on PT POC progression.and initial HEP   PATIENT EDUCATION:  Education details: HEP review  Person educated: Patient Education method: Explanation, Demonstration, Verbal cues, Tactile cues, and Handouts Education comprehension: verbalized understanding, verbal cues required, tactile cues required, and needs further education  HOME EXERCISE PROGRAM: Access Code: [email or web address] URL: [email or web address] Date: 10/11/2024 Prepared by: Sol Gaskins  Exercises - Tandem Stance in Corner  - 1 x daily - 7 x weekly - 3 sets - 10 reps - Single Leg Stance  - 1 x daily - 7 x weekly - 3 sets - 10 reps - Heel Raises with Counter Support  - 1 x daily - 7 x weekly - 3 sets - 10 reps - Toe Raises with Counter Support  - 1 x daily - 7 x weekly - 3 sets - 10 reps  Patient Education - What You Can Do to Prevent Falls   ASSESSMENT:  CLINICAL IMPRESSION: Progressed with LE strengthening and dynamic balance challenges. Pt shows difficulty with tandem gait and braiding. She did mention needing to practice getting up from floor after fall, so may could practice this next visit  EVAL:  Charlii Yost is a 83 y.o. female who was referred to physical therapy for evaluation and treatment for gait abnormality.  Patient presents with physical  impairments of impaired activity tolerance, impaired standing balance, impaired ambulation, and decreased safety awareness impacting safe and independent functional mobility.  Examination revealed patient is at risk for falls and functional decline as evidenced by the following objective test measures: Gait speed 1.98 ft/sec, (2.62 ft/sec is needed for community access), BERG score of 43/56;  TUG of 11.6 sec (>13.5 sec indicates increased risk for falls), and 5xSTS of 18.96 sec (>15 sec indicates increased risk for falls and decreased BLE power).  ABC scale score of 48% indicates a moderate level of physical functioning.  Jaeleigh will benefit from skilled PT to address above deficits to improve mobility and activity tolerance to help reach the maximal  level of functional independence and mobility. Patient demonstrates understanding of this POC and is in agreement with this plan.   OBJECTIVE IMPAIRMENTS: difficulty walking, decreased strength, postural dysfunction, and pain.   ACTIVITY LIMITATIONS: carrying, lifting, bending, squatting, and locomotion level  PARTICIPATION LIMITATIONS: cleaning, laundry, shopping, and community activity  PERSONAL FACTORS: Age, Fitness, and 1-2 comorbidities: Afib, Diabetes, neuropathy, retinopathy, osteoporosis, h/o falls, h/o L breast CA  are also affecting patient's functional outcome.   REHAB POTENTIAL: Good  CLINICAL DECISION MAKING: Evolving/moderate complexity  EVALUATION COMPLEXITY: Moderate   GOALS: Goals reviewed with patient? Yes  SHORT TERM GOALS: Target date: 10/10/2024   Patient will be independent with initial HEP to improve outcomes and carryover.  Baseline: 100% PT assist required for correct completion 09/27/24:  reviewed HEP and assist needed for recall and performance Goal status: IN PROGRESS- partial compliance 10/11/24  2.  Patient will be educated on strategies to decrease risk of falls.  Baseline: no education provided yet 09/27/24:   provided falls handout in medbrideg Goal status: MET- 10/11/24   LONG TERM GOALS: Target date: 11/08/2024   Patient will be independent with advanced/ongoing HEP to facilitate ability to maintain/progress functional gains from skilled physical therapy services. Baseline: no advanced HEP yet Goal status: INITIAL   3.  Patient will be able to step up/down curb safely with LRAD for safety with community ambulation.  Baseline: TBD Goal status: INITIAL   4.  Patient will demonstrate improved  BLE strength to >/= 5/5 for improved stability and ease of mobility Baseline: Refer to above LE MMT table Goal status: INITIAL  5.  Patient will improve 5xSTS time to </= 14 seconds for improved efficiency and safety with transfers. Baseline: 18.96 sec Goal status: INITIAL   6.  Patient will demonstrate gait speed of >/= 2.62 ft/sec (0.55 m/s) to be a safe limited community ambulator with decreased risk for recurrent falls.  Baseline: 1.98 Goal status: INITIAL  7.  Patient will improve Berg score to >/= 51/56 to improve safety and stability with ADLs in standing and reduce risk for falls. (MCID= 8 points)  Baseline: 43 Goal status: INITIAL  8.  Patient will demonstrate at least 19/24 on DGI to improve gait stability and reduce risk for falls. Baseline: TBD Goal status: INITIAL  9. Patient will improve FGA score to at least 19/30 to improve gait stability and reduce risk for falls. Baseline: TBD Goal status: INITIAL  10.  Patient will report >/= 70% on ABC scale (MCID = 19%) to demonstrate improved balance confidence with functional mobility and gait. Baseline: 48% Goal status: INITIAL   PLAN:  PT FREQUENCY: 1-2x/week  PT DURATION: 8 weeks  PLANNED INTERVENTIONS: [id number]- PT Re-evaluation, 97750- Physical Performance Testing, 97110-Therapeutic exercises, 97530- Therapeutic activity, 97112- Neuromuscular re-education, 97535- Self Care, [id number]- Manual therapy, [phone]- Gait training,  Patient/Family education, Balance training, and Stair training  PLAN FOR NEXT SESSION: floor transfers? do steps; tandem walk, braiding, single leg clocks   Avia Merkley L Nealy Karapetian, PTA 10/11/2024, 11:02 AM

## 2024-10-14 ENCOUNTER — Ambulatory Visit: Admitting: Rehabilitation

## 2024-10-14 ENCOUNTER — Encounter: Payer: Self-pay | Admitting: Rehabilitation

## 2024-10-14 DIAGNOSIS — R262 Difficulty in walking, not elsewhere classified: Secondary | ICD-10-CM

## 2024-10-14 DIAGNOSIS — R293 Abnormal posture: Secondary | ICD-10-CM

## 2024-10-14 DIAGNOSIS — M6281 Muscle weakness (generalized): Secondary | ICD-10-CM

## 2024-10-14 NOTE — Therapy (Signed)
 OUTPATIENT PHYSICAL THERAPY NEURO TREATMENT   Patient Name: Vickie Burns MRN: [id number] DOB:01-Aug-1941, 83 y.o., female Today's Date: 10/14/2024   END OF SESSION:  PT End of Session - 10/14/24 1034     Visit Number 5    Date for Recertification  11/08/24    PT Start Time 1025    PT Stop Time 1120    PT Time Calculation (min) 55 min    Activity Tolerance Patient tolerated treatment well;No increased pain    Behavior During Therapy [facility] for tasks assessed/performed            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   Essential hypertension 10/07/2016   Fall at home, initial encounter 04/05/2022   Family history of breast cancer    aunt   History of adenomatous polyp of colon 02/12/2021   HLD (hyperlipidemia) 08/17/2012   Hypercholesteremia 2000   Hypercholesterolemia 2000   Hypertension    history   Long term (current) use of anticoagulants 12/14/2021   Long term (current) use of insulin  (HCC) 02/12/2021   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   New onset a-fib (HCC) 06/28/2020   Osteoporosis 09/06/2016   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   Persistent atrial fibrillation (HCC) 06/07/2021   Personal history of malignant neoplasm of breast 02/12/2021   Personal history of radiation therapy    Postmenopausal    took HRT 1999 - 2004   Preop cardiovascular exam 07/03/2020   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   Traumatic ecchymosis of  right lower leg 04/05/2022   Unspecified atrial fibrillation (HCC) 07/03/2020   Past Surgical History:  Procedure Laterality Date   BREAST BIOPSY     BREAST BIOPSY Left 10/29/2022   MM LT BREAST BX W LOC DEV 1ST LESION IMAGE BX SPEC STEREO GUIDE 10/29/2022 [facility]   BREAST EXCISIONAL BIOPSY Left 09/07/2020   BREAST LUMPECTOMY  04/2009   rt breast  estrogen +, Her 2 Nu negative   BREAST LUMPECTOMY WITH RADIOACTIVE SEED LOCALIZATION Left 09/07/2020   Procedure: RADIOCATIVE SEED GUIDED LEFT BREAST LUMPECTOMY;  Surgeon: Ethyl Lenis, MD;  Location: [facility];  Service: General;  Laterality: Left;   COLONOSCOPY WITH PROPOFOL  N/A 04/05/2014   Procedure: COLONOSCOPY WITH PROPOFOL ;  Surgeon: Gladis MARLA Louder, MD;  Location: [facility];  Service: Endoscopy;  Laterality: N/A;   ORIF FOOT FRACTURE Right 04/20/2014   done in [facility] TOOTH EXTRACTION     Patient Active Problem List   Diagnosis Date Noted   Nuclear sclerotic cataract of left eye 02/26/2024   Osteopenia of left forearm 12/09/2023   Well woman exam with routine gynecological exam 12/04/2023   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   Acute blood loss anemia 04/05/2022   Traumatic ecchymosis of right lower leg 04/05/2022   Fall at home, initial encounter 04/05/2022   Long term (current) use of anticoagulants 12/14/2021   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   Coronary arteriosclerosis 09/25/2021   Persistent atrial fibrillation (HCC) 06/07/2021   Diabetic peripheral  neuropathy associated with type 2 diabetes mellitus (HCC) 02/12/2021   Diabetic retinopathy associated with type 2 diabetes mellitus (HCC) 02/12/2021   History of adenomatous polyp of colon 02/12/2021   Long term (current) use of insulin  (HCC) 02/12/2021   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   Personal history of malignant neoplasm of breast 02/12/2021   Atrial fibrillation (HCC) 02/12/2021   Family history of breast cancer     Hypertension    Personal history of radiation therapy    Postmenopausal    Diabetes mellitus due to underlying condition with unspecified complications (HCC) 07/03/2020   Unspecified atrial fibrillation (HCC) 07/03/2020   New onset a-fib (HCC) 06/28/2020   Essential hypertension 10/07/2016   Osteoporosis 09/06/2016   Macular degeneration 2016   HLD (hyperlipidemia) 08/17/2012   Diabetes mellitus, type 2 (HCC) 01/27/2012   Breast CA (HCC) 12/20/2011   Hypercholesterolemia 2000   Hypercholesteremia 2000    PCP: Frann Mabel Mt, DO   REFERRING PROVIDER: Frann Mabel Mt*   REFERRING DIAG: R26.9 (ICD-10-CM) - Gait abnormality  THERAPY DIAG:  Difficulty in walking, not elsewhere classified  Muscle weakness (generalized)  Abnormal posture  RATIONALE FOR EVALUATION AND TREATMENT: Rehabilitation  ONSET DATE: >1 year, but worsening progressively over last few months  NEXT MD VISIT:    SUBJECTIVE:                                                                                                                                                                                                         SUBJECTIVE STATEMENT: Patient reports she feels she is doing a little better.   States went to work at [employer] of election yesterday and did ok.  Reports some calf pain from doing calf raises.  83 y/o female referred to PT from PCP for gait abnormality.   She is also having some memory deficits and brain MRI is pending.  She is also supposed to see a neurologist  Pt accompanied by: self  PAIN: Are you having pain? No  PERTINENT HISTORY:  Afib, Diabetes, neuropathy, retinopathy, osteoporosis, h/o falls, h/o L breast CA   PRECAUTIONS: Other: L breast surgery/lumpectomy  RED FLAGS: None  WEIGHT BEARING RESTRICTIONS: No  FALLS:  Has patient fallen in last 6 months? Yes. Number of falls several  LIVING ENVIRONMENT: Lives with: lives alone Lives in:  House/apartment Stairs: Yes: Internal: 14 steps; on right going up and External: 6 steps; bilateral but cannot reach both Has following equipment at home: Single point cane, Walker - 2 wheeled, bed side commode, and Grab bars  OCCUPATION: works for [employer] teacher  PLOF: Independent with gait  PATIENT GOALS: have better balance   OBJECTIVE: (objective measures completed at initial evaluation unless otherwise dated)  DIAGNOSTIC FINDINGS:  Brain MRI is pending  COGNITION: Overall cognitive status: Within functional limits for tasks assessed   SENSATION: WFL  COORDINATION: WNL  EDEMA:  Mild BLE  MUSCLE TONE: WNL  DTRs:  NT  POSTURE:  rounded shoulders and forward head     LOWER EXTREMITY ROM:    MMT Right eval Left eval  Hip flexion    Hip extension    Hip abduction    Hip adduction    Hip internal rotation    Hip external rotation    Knee flexion    Knee extension    Ankle dorsiflexion    Ankle plantarflexion    Ankle inversion    Ankle eversion    (Blank rows = not tested) LOWER EXTREMITY MMT:     Active  Right eval Left eval  Hip flexion 4 4  Hip extension    Hip abduction 4 4  Hip adduction    Hip internal rotation 4+ 4  Hip external rotation 4 4+  Knee flexion 5 5  Knee extension 5 5  Ankle dorsiflexion 4 4-  Ankle plantarflexion    Ankle inversion    Ankle eversion     (Blank rows = not tested) BED MOBILITY:  Rolling to Right CGA Rolling to Left Complete Independence  TRANSFERS: Assistive device utilized: None  Sit to stand: Modified independence Stand to sit: Modified independence Chair to chair: Modified independence Floor: NT  GAIT: Distance walked:  clinic distances and into clinic from parking lot Assistive device utilized: None Level of assistance: SBA Gait pattern: decreased step length- Right, decreased step length- Left, decreased stance time- Right, decreased stance time- Left, decreased ankle dorsiflexion- Right,  and decreased ankle dorsiflexion- Left Comments:   FUNCTIONAL TESTS:  PHYSICAL PERFORMANCE TEST or MEASUREMENT:     Gait speed:  1.98 ft/sec 5x STS = 18.96 sec TUG score = 11.6 sec   PATIENT SURVEYS:  ABC scale: The Activities-Specific Balance Confidence (ABC) Scale 0% 10 20 30  40 50 60 70 80 90 100% No confidence<->completely confident  "How confident are you that you will not lose your balance or become unsteady when you . . .   Date tested 10/14/2024  Walk around the house 70%  2. Walk up or down stairs 70%  3. Bend over and pick up a slipper from in front of a closet floor 80%  4. Reach for a small can off a shelf at eye level 100%  5. Stand on tip toes and reach for something above your head 40%  6. Stand on a chair and reach for something 0%  7. Sweep the floor 80%  8. Walk outside the house to a car parked in the driveway 80%  9. Get into or out of a car 100%  10. Walk across a parking lot to the mall 80%  11. Walk up or down a ramp 0%  12. Walk in a crowded mall where people rapidly walk past you 80%  13. Are bumped into by people as you walk through the mall 0%  14. Step onto or off of an escalator while you are holding onto the railing 8011%  15. Step onto or off an escalator while holding onto parcels such that you cannot hold onto the railing 0%  16. Walk outside on icy  sidewalks 0%  Total: #/16 780/1600 = 48%      TODAY'S TREATMENT:  10/14/24 THERAPEUTIC EXERCISE: To improve strength and endurance.  Demonstration, verbal and tactile cues throughout for technique. Bike L3 x 7'  NEUROMUSCULAR RE-EDUCATION: To improve balance, coordination, kinesthesia, posture, and proprioception. Tandem gait F/B 30' x 4 each direction Braiding in front and behind 30' x 4 each way Foam sidestepping x 5 laps  4 step ups  w/ YTB resistance from back x 5, from each side x 5  for each leg w/ 1ue support  THERAPEUTIC ACTIVITIES: To improve functional performance.   Demonstration, verbal and tactile cues throughout for technique. - Up/down 1 flight of steps with R rail reciprocal pattern for ascending, nonreciprocal pattern for descent - seated at edge of table reaching laterally to touch floor x 10 each UE;  much more difficult going to the L - Table to floor transfer--patient preference is scooting to the edge of using both arms behind her on table to lower butt to floor (tricep method) min assist required -  Floor to table transfer with min assist for correct technique of side sitting with knees bent, reaching across body to get both hands on floor, getting to hands and knees, propping arms on table, bringing LLE up for 1/2 kneel and then to partial stand/squat pivot to table (patient would prefer to use back to table method with arms behind her for tricep push up, but this is not ideal for her) - standing toe prop gastroc stretch x 1' x 3 each leg   10/11/24 Bike L2x36min Leg curls 15lb x 20 BLE Leg extension 5lb x 20 BLE Step ups BLE 2 x 10 6 1HA Educated on preventing falls risk and provided handout Tandem gait along counter 4x with support; 4x no support Braiding backward along counter 2x with support; 2x no support  09/30/24 Bike L2x40min Step ups BLE x 10 6 1HA Lateral step ups LLE x 10 1HA; RLE 2HA x 10 Standing hip abduction 2lb 2 x 10 BLE Standing hip extension 2lb 2 x 10 BLE Standing marching 2lb 2 x 10 BLE Seated LAQ 2lb 2x10 BLE Seated SLR 2lb 2x10 BLE  09/27/24 THERAPEUTIC EXERCISE: To improve strength and endurance.  Demonstration, verbal and tactile cues throughout for technique. NuStep L4 x 6'   NEUROMUSCULAR RE-EDUCATION: To improve balance, coordination, kinesthesia, and posture. Corner balance:  Toe raises x 20  Heel raises x 20  Marching x 20 BLE  EC stance x 1'   Tandem stance x 1' BLE  Step stance EO x 1' each leg;  EC x 1' ea leg  Theraband foam standing heel and toes off the edges EO x 1'; EC x 1' Tandem gait at  counter F/B X 3 LAPS Standing rhythmic stabilization w/ black TB around waist pulling in all directions for challenges Seated on 75 cm swiss ball:  Bouncing EO x 1';  EC x 1;  Rocking F/B x 20;  S/S x 20  Pelvic circles x 20 CW;  x20 CCW  Alternate marching x 10  B shoulder flexion x 10  Alternate LAQ x 10  09/13/24 SELF CARE: Provided education on PT POC progression.and initial HEP   PATIENT EDUCATION:  Education details: HEP review  Person educated: Patient Education method: Explanation, Demonstration, Verbal cues, Tactile cues, and Handouts Education comprehension: verbalized understanding, verbal cues required, tactile cues required, and needs further education  HOME EXERCISE PROGRAM: Access Code: [email or web address] URL: [email or web address] Date: 10/11/2024 Prepared  by: Braylin Clark  Exercises - Tandem Stance in Corner  - 1 x daily - 7 x weekly - 3 sets - 10 reps - Single Leg Stance  - 1 x daily - 7 x weekly - 3 sets - 10 reps - Heel Raises with Counter Support  - 1 x daily - 7 x weekly - 3 sets - 10 reps - Toe Raises with Counter Support  - 1 x daily - 7 x weekly - 3 sets - 10 reps  Patient Education - What You Can Do to Prevent Falls   ASSESSMENT:  CLINICAL IMPRESSION: Patient is getting some calf soreness from working on toe raises a lot at home so she is advised to take 2 days off from her HEP and only calf raise exercises 2-3 days/week. She has no calf edema, redness, or pain with Homan's test.   Spent a lot time today working on floor transfers and teaching proper technique.   She needs more work on this if she is willing to do it and also with trunk/core stability reaching down and L.  R obliques and quadratus are weak and she has difficulty maintaining balance reaching down w/ LUE.  She has several losses of balance with ambulation in clinic today but is able to self correct.  These occur when she is multi tasking with ambulation so needs more work on this as  well.   PT remains necessary for balance, gait, strength, stability deficits.   Continue per POC  EVAL:  Euretha Najarro is a 83 y.o. female who was referred to physical therapy for evaluation and treatment for gait abnormality.  Patient presents with physical impairments of impaired activity tolerance, impaired standing balance, impaired ambulation, and decreased safety awareness impacting safe and independent functional mobility.  Examination revealed patient is at risk for falls and functional decline as evidenced by the following objective test measures: Gait speed 1.98 ft/sec, (2.62 ft/sec is needed for community access), BERG score of 43/56;  TUG of 11.6 sec (>13.5 sec indicates increased risk for falls), and 5xSTS of 18.96 sec (>15 sec indicates increased risk for falls and decreased BLE power).  ABC scale score of 48% indicates a moderate level of physical functioning.  Jammi will benefit from skilled PT to address above deficits to improve mobility and activity tolerance to help reach the maximal level of functional independence and mobility. Patient demonstrates understanding of this POC and is in agreement with this plan.   OBJECTIVE IMPAIRMENTS: difficulty walking, decreased strength, postural dysfunction, and pain.   ACTIVITY LIMITATIONS: carrying, lifting, bending, squatting, and locomotion level  PARTICIPATION LIMITATIONS: cleaning, laundry, shopping, and community activity  PERSONAL FACTORS: Age, Fitness, and 1-2 comorbidities: Afib, Diabetes, neuropathy, retinopathy, osteoporosis, h/o falls, h/o L breast CA  are also affecting patient's functional outcome.   REHAB POTENTIAL: Good  CLINICAL DECISION MAKING: Evolving/moderate complexity  EVALUATION COMPLEXITY: Moderate   GOALS: Goals reviewed with patient? Yes  SHORT TERM GOALS: Target date: 10/10/2024   Patient will be independent with initial HEP to improve outcomes and carryover.  Baseline: 100% PT assist required  for correct completion 09/27/24:  reviewed HEP and assist needed for recall and performance Goal status: IN PROGRESS- partial compliance 10/11/24  2.  Patient will be educated on strategies to decrease risk of falls.  Baseline: no education provided yet 09/27/24:  provided falls handout in medbrideg Goal status: MET- 10/11/24   LONG TERM GOALS: Target date: 11/08/2024   Patient will be independent with advanced/ongoing  HEP to facilitate ability to maintain/progress functional gains from skilled physical therapy services. Baseline: no advanced HEP yet Goal status: INITIAL   3.  Patient will be able to step up/down curb safely with LRAD for safety with community ambulation.  Baseline: able to do 1 flight of steps with 1 rail support Goal status: INITIAL   4.  Patient will demonstrate improved  BLE strength to >/= 5/5 for improved stability and ease of mobility Baseline: Refer to above LE MMT table Goal status: INITIAL  5.  Patient will improve 5xSTS time to </= 14 seconds for improved efficiency and safety with transfers. Baseline: 18.96 sec Goal status: INITIAL   6.  Patient will demonstrate gait speed of >/= 2.62 ft/sec (0.55 m/s) to be a safe limited community ambulator with decreased risk for recurrent falls.  Baseline: 1.98 Goal status: INITIAL  7.  Patient will improve Berg score to >/= 51/56 to improve safety and stability with ADLs in standing and reduce risk for falls. (MCID= 8 points)  Baseline: 43 Goal status: INITIAL  8.  Patient will demonstrate at least 19/24 on DGI to improve gait stability and reduce risk for falls. Baseline: TBD Goal status: INITIAL  9. Patient will improve FGA score to at least 19/30 to improve gait stability and reduce risk for falls. Baseline: TBD Goal status: INITIAL  10.  Patient will report >/= 70% on ABC scale (MCID = 19%) to demonstrate improved balance confidence with functional mobility and gait. Baseline: 48% Goal status:  INITIAL   PLAN:  PT FREQUENCY: 1-2x/week  PT DURATION: 8 weeks  PLANNED INTERVENTIONS: [id number]- PT Re-evaluation, 97750- Physical Performance Testing, 97110-Therapeutic exercises, 97530- Therapeutic activity, 97112- Neuromuscular re-education, 97535- Self Care, [id number]- Manual therapy, [phone]- Gait training, Patient/Family education, Balance training, and Stair training  PLAN FOR NEXT SESSION: Need to do FGA or DGI;  Add cognitive challenges with gait and tandem walk, braiding, single leg clocks   Brett Soza, PT 10/14/2024, 11:48 AM

## 2024-10-21 ENCOUNTER — Ambulatory Visit

## 2024-10-21 DIAGNOSIS — R262 Difficulty in walking, not elsewhere classified: Secondary | ICD-10-CM | POA: Diagnosis not present

## 2024-10-21 DIAGNOSIS — M6281 Muscle weakness (generalized): Secondary | ICD-10-CM

## 2024-10-21 DIAGNOSIS — R293 Abnormal posture: Secondary | ICD-10-CM

## 2024-10-21 NOTE — Therapy (Signed)
 OUTPATIENT PHYSICAL THERAPY NEURO TREATMENT   Patient Name: Vickie Burns MRN: [id number] DOB:08-30-1941, 83 y.o., female Today's Date: 10/21/2024   END OF SESSION:  PT End of Session - 10/21/24 1111     Visit Number 6    Date for Recertification  11/08/24    PT Start Time 1104    PT Stop Time 1145    PT Time Calculation (min) 41 min    Activity Tolerance Patient tolerated treatment well;No increased pain    Behavior During Therapy [facility] for tasks assessed/performed             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   Essential hypertension 10/07/2016   Fall at home, initial encounter 04/05/2022   Family history of breast cancer    aunt   History of adenomatous polyp of colon 02/12/2021   HLD (hyperlipidemia) 08/17/2012   Hypercholesteremia 2000   Hypercholesterolemia 2000   Hypertension    history   Long term (current) use of anticoagulants 12/14/2021   Long term (current) use of insulin  (HCC) 02/12/2021   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   New onset a-fib (HCC) 06/28/2020   Osteoporosis 09/06/2016   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   Persistent atrial fibrillation (HCC) 06/07/2021   Personal history of malignant neoplasm of breast 02/12/2021   Personal history of radiation therapy    Postmenopausal    took HRT 1999 - 2004   Preop cardiovascular exam 07/03/2020   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   Traumatic ecchymosis  of right lower leg 04/05/2022   Unspecified atrial fibrillation (HCC) 07/03/2020   Past Surgical History:  Procedure Laterality Date   BREAST BIOPSY     BREAST BIOPSY Left 10/29/2022   MM LT BREAST BX W LOC DEV 1ST LESION IMAGE BX SPEC STEREO GUIDE 10/29/2022 [facility]   BREAST EXCISIONAL BIOPSY Left 09/07/2020   BREAST LUMPECTOMY  04/2009   rt breast  estrogen +, Her 2 Nu negative   BREAST LUMPECTOMY WITH RADIOACTIVE SEED LOCALIZATION Left 09/07/2020   Procedure: RADIOCATIVE SEED GUIDED LEFT BREAST LUMPECTOMY;  Surgeon: Ethyl Lenis, MD;  Location:  SURGERY CENTER;  Service: General;  Laterality: Left;   COLONOSCOPY WITH PROPOFOL  N/A 04/05/2014   Procedure: COLONOSCOPY WITH PROPOFOL ;  Surgeon: Gladis MARLA Louder, MD;  Location: [facility];  Service: Endoscopy;  Laterality: N/A;   ORIF FOOT FRACTURE Right 04/20/2014   done in [facility] TOOTH EXTRACTION     Patient Active Problem List   Diagnosis Date Noted   Nuclear sclerotic cataract of left eye 02/26/2024   Osteopenia of left forearm 12/09/2023   Well woman exam with routine gynecological exam 12/04/2023   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   Acute blood loss anemia 04/05/2022   Traumatic ecchymosis of right lower leg 04/05/2022   Fall at home, initial encounter 04/05/2022   Long term (current) use of anticoagulants 12/14/2021   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   Coronary arteriosclerosis 09/25/2021   Persistent atrial fibrillation (HCC) 06/07/2021   Diabetic  peripheral neuropathy associated with type 2 diabetes mellitus (HCC) 02/12/2021   Diabetic retinopathy associated with type 2 diabetes mellitus (HCC) 02/12/2021   History of adenomatous polyp of colon 02/12/2021   Long term (current) use of insulin  (HCC) 02/12/2021   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   Personal history of malignant neoplasm of breast 02/12/2021   Atrial fibrillation (HCC) 02/12/2021   Family history of breast cancer     Hypertension    Personal history of radiation therapy    Postmenopausal    Diabetes mellitus due to underlying condition with unspecified complications (HCC) 07/03/2020   Unspecified atrial fibrillation (HCC) 07/03/2020   New onset a-fib (HCC) 06/28/2020   Essential hypertension 10/07/2016   Osteoporosis 09/06/2016   Macular degeneration 2016   HLD (hyperlipidemia) 08/17/2012   Diabetes mellitus, type 2 (HCC) 01/27/2012   Breast CA (HCC) 12/20/2011   Hypercholesterolemia 2000   Hypercholesteremia 2000    PCP: Frann Mabel Mt, DO   REFERRING PROVIDER: Frann Mabel Mt, DO   REFERRING DIAG: R26.9 (ICD-10-CM) - Gait abnormality  THERAPY DIAG:  Difficulty in walking, not elsewhere classified  Muscle weakness (generalized)  Abnormal posture  RATIONALE FOR EVALUATION AND TREATMENT: Rehabilitation  ONSET DATE: >1 year, but worsening progressively over last few months  NEXT MD VISIT:    SUBJECTIVE:                                                                                                                                                                                                         SUBJECTIVE STATEMENT: Pt reports she got an injection for her eye yesterday, getting around fine today just cautious. No recent falls  83 y/o female referred to PT from PCP for gait abnormality.   She is also having some memory deficits and brain MRI is pending.  She is also supposed to see a neurologist  Pt accompanied by: self  PAIN: Are you having pain? No  PERTINENT HISTORY:  Afib, Diabetes, neuropathy, retinopathy, osteoporosis, h/o falls, h/o L breast CA   PRECAUTIONS: Other: L breast surgery/lumpectomy  RED FLAGS: None  WEIGHT BEARING RESTRICTIONS: No  FALLS:  Has patient fallen in last 6 months? Yes. 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  PLOF: Independent with gait  PATIENT  GOALS: have better balance   OBJECTIVE: (objective measures completed at initial evaluation unless otherwise dated)  DIAGNOSTIC FINDINGS:  Brain MRI is pending  COGNITION: Overall cognitive status: Within functional limits for tasks assessed   SENSATION: WFL  COORDINATION: WNL  EDEMA:  Mild BLE  MUSCLE TONE: WNL  DTRs:  NT  POSTURE:  rounded shoulders and forward head     LOWER EXTREMITY ROM:    MMT Right eval Left eval  Hip flexion    Hip extension    Hip abduction    Hip adduction    Hip internal rotation    Hip external rotation    Knee flexion    Knee extension    Ankle dorsiflexion    Ankle plantarflexion    Ankle inversion    Ankle eversion    (Blank rows = not tested) LOWER EXTREMITY MMT:     Active  Right eval Left eval  Hip flexion 4 4  Hip extension    Hip abduction 4 4  Hip adduction    Hip internal rotation 4+ 4  Hip external rotation 4 4+  Knee flexion 5 5  Knee extension 5 5  Ankle dorsiflexion 4 4-  Ankle plantarflexion    Ankle inversion    Ankle eversion     (Blank rows = not tested) BED MOBILITY:  Rolling to Right CGA Rolling to Left Complete Independence  TRANSFERS: Assistive device utilized: None  Sit to stand: Modified independence Stand to sit: Modified independence Chair to chair: Modified independence Floor: NT  GAIT: Distance walked:  clinic distances and into clinic from parking lot Assistive device utilized: None Level of assistance: SBA Gait pattern: decreased step length- Right, decreased step length- Left, decreased stance time- Right, decreased stance time- Left, decreased ankle dorsiflexion- Right, and decreased ankle dorsiflexion- Left Comments:   FUNCTIONAL  TESTS:  PHYSICAL PERFORMANCE TEST or MEASUREMENT:   [facility] PT Assessment - 10/21/24 0001       Functional Gait  Assessment   Gait assessed  Yes    Gait Level Surface Walks 20 ft, slow speed, abnormal gait pattern, evidence for imbalance or deviates 10-15 in outside of the 12 in walkway width. Requires more than 7 sec to ambulate 20 ft.    Change in Gait Speed Makes only minor adjustments to walking speed, or accomplishes a change in speed with significant gait deviations, deviates 10-15 in outside the 12 in walkway width, or changes speed but loses balance but is able to recover and continue walking.    Gait with Horizontal Head Turns Performs head turns with moderate changes in gait velocity, slows down, deviates 10-15 in outside 12 in walkway width but recovers, can continue to walk.    Gait with Vertical Head Turns Performs task with slight change in gait velocity (eg, minor disruption to smooth gait path), deviates 6 - 10 in outside 12 in walkway width or uses assistive device    Gait and Pivot Turn Pivot turns safely within 3 sec and stops quickly with no loss of balance.    Step Over Obstacle Is able to step over one shoe box (4.5 in total height) without changing gait speed. No evidence of imbalance.    Gait with Narrow Base of Support Ambulates less than 4 steps heel to toe or cannot perform without assistance.    Gait with Eyes Closed Walks 20 ft, slow speed, abnormal gait pattern, evidence for imbalance, deviates 10-15 in outside 12 in walkway width. Requires more than  9 sec to ambulate 20 ft.    Ambulating Backwards Walks 20 ft, slow speed, abnormal gait pattern, evidence for imbalance, deviates 10-15 in outside 12 in walkway width.    Steps Two feet to a stair, must use rail.    Total Score 13           Gait speed:  1.98 ft/sec 5x STS = 18.96 sec TUG score = 11.6 sec   PATIENT SURVEYS:  ABC scale: The Activities-Specific Balance Confidence (ABC) Scale 0% 10 20 30  40 50 60 70  80 90 100% No confidence<->completely confident  How confident are you that you will not lose your balance or become unsteady when you . . .   Date tested 10/21/2024       10/21/24  Walk around the house 70%   2. Walk up or down stairs 70%   3. Bend over and pick up a slipper from in front of a closet floor 80%   4. Reach for a small can off a shelf at eye level 100%   5. Stand on tip toes and reach for something above your head 40%   6. Stand on a chair and reach for something 0%   7. Sweep the floor 80%   8. Walk outside the house to a car parked in the driveway 80%   9. Get into or out of a car 100%   10. Walk across a parking lot to the mall 80%   11. Walk up or down a ramp 0%   12. Walk in a crowded mall where people rapidly walk past you 80%   13. Are bumped into by people as you walk through the mall 0%   14. Step onto or off of an escalator while you are holding onto the railing 8011%   15. Step onto or off an escalator while holding onto parcels such that you cannot hold onto the railing 0%   16. Walk outside on icy sidewalks 0%   Total: #/16 780/1600 = 48% 880 / 1600 = 55.0 %      TODAY'S TREATMENT:  10/21/24 Nustep L5x34min ABC scale- 880 / 1600 = 55.0 %- reviewed with patient as she did not have her glasses  [facility] PT Assessment - 10/21/24 0001       Functional Gait  Assessment   Gait assessed  Yes    Gait Level Surface Walks 20 ft, slow speed, abnormal gait pattern, evidence for imbalance or deviates 10-15 in outside of the 12 in walkway width. Requires more than 7 sec to ambulate 20 ft.    Change in Gait Speed Makes only minor adjustments to walking speed, or accomplishes a change in speed with significant gait deviations, deviates 10-15 in outside the 12 in walkway width, or changes speed but loses balance but is able to recover and continue walking.    Gait with Horizontal Head Turns Performs head turns with moderate changes in gait velocity, slows down, deviates  10-15 in outside 12 in walkway width but recovers, can continue to walk.    Gait with Vertical Head Turns Performs task with slight change in gait velocity (eg, minor disruption to smooth gait path), deviates 6 - 10 in outside 12 in walkway width or uses assistive device    Gait and Pivot Turn Pivot turns safely within 3 sec and stops quickly with no loss of balance.    Step Over Obstacle Is able to step over one shoe box (4.5 in total  height) without changing gait speed. No evidence of imbalance.    Gait with Narrow Base of Support Ambulates less than 4 steps heel to toe or cannot perform without assistance.    Gait with Eyes Closed Walks 20 ft, slow speed, abnormal gait pattern, evidence for imbalance, deviates 10-15 in outside 12 in walkway width. Requires more than 9 sec to ambulate 20 ft.    Ambulating Backwards Walks 20 ft, slow speed, abnormal gait pattern, evidence for imbalance, deviates 10-15 in outside 12 in walkway width.    Steps Two feet to a stair, must use rail.    Total Score 13          10/14/24 THERAPEUTIC EXERCISE: To improve strength and endurance.  Demonstration, verbal and tactile cues throughout for technique. Bike L3 x 7'  NEUROMUSCULAR RE-EDUCATION: To improve balance, coordination, kinesthesia, posture, and proprioception. Tandem gait F/B 30' x 4 each direction Braiding in front and behind 30' x 4 each way Foam sidestepping x 5 laps  4 step ups  w/ YTB resistance from back x 5, from each side x 5  for each leg w/ 1ue support  THERAPEUTIC ACTIVITIES: To improve functional performance.  Demonstration, verbal and tactile cues throughout for technique. - Up/down 1 flight of steps with R rail reciprocal pattern for ascending, nonreciprocal pattern for descent - seated at edge of table reaching laterally to touch floor x 10 each UE;  much more difficult going to the L - Table to floor transfer--patient preference is scooting to the edge of using both arms behind her on  table to lower butt to floor (tricep method) min assist required -  Floor to table transfer with min assist for correct technique of side sitting with knees bent, reaching across body to get both hands on floor, getting to hands and knees, propping arms on table, bringing LLE up for 1/2 kneel and then to partial stand/squat pivot to table (patient would prefer to use back to table method with arms behind her for tricep push up, but this is not ideal for her) - standing toe prop gastroc stretch x 1' x 3 each leg   10/11/24 Bike L2x19min Leg curls 15lb x 20 BLE Leg extension 5lb x 20 BLE Step ups BLE 2 x 10 6 1HA Educated on preventing falls risk and provided handout Tandem gait along counter 4x with support; 4x no support Braiding backward along counter 2x with support; 2x no support  09/30/24 Bike L2x62min Step ups BLE x 10 6 1HA Lateral step ups LLE x 10 1HA; RLE 2HA x 10 Standing hip abduction 2lb 2 x 10 BLE Standing hip extension 2lb 2 x 10 BLE Standing marching 2lb 2 x 10 BLE Seated LAQ 2lb 2x10 BLE Seated SLR 2lb 2x10 BLE  09/27/24 THERAPEUTIC EXERCISE: To improve strength and endurance.  Demonstration, verbal and tactile cues throughout for technique. NuStep L4 x 6'   NEUROMUSCULAR RE-EDUCATION: To improve balance, coordination, kinesthesia, and posture. Corner balance:  Toe raises x 20  Heel raises x 20  Marching x 20 BLE  EC stance x 1'   Tandem stance x 1' BLE  Step stance EO x 1' each leg;  EC x 1' ea leg  Theraband foam standing heel and toes off the edges EO x 1'; EC x 1' Tandem gait at counter F/B X 3 LAPS Standing rhythmic stabilization w/ black TB around waist pulling in all directions for challenges Seated on 75 cm swiss ball:  Bouncing EO x  1';  EC x 1;  Rocking F/B x 20;  S/S x 20  Pelvic circles x 20 CW;  x20 CCW  Alternate marching x 10  B shoulder flexion x 10  Alternate LAQ x 10  09/13/24 SELF CARE: Provided education on PT POC progression.and  initial HEP   PATIENT EDUCATION:  Education details: HEP review  Person educated: Patient Education method: Explanation, Demonstration, Verbal cues, Tactile cues, and Handouts Education comprehension: verbalized understanding, verbal cues required, tactile cues required, and needs further education  HOME EXERCISE PROGRAM: Access Code: [email or web address] URL: [email or web address] Date: 10/11/2024 Prepared by: Sol Gaskins  Exercises - Tandem Stance in Corner  - 1 x daily - 7 x weekly - 3 sets - 10 reps - Single Leg Stance  - 1 x daily - 7 x weekly - 3 sets - 10 reps - Heel Raises with Counter Support  - 1 x daily - 7 x weekly - 3 sets - 10 reps - Toe Raises with Counter Support  - 1 x daily - 7 x weekly - 3 sets - 10 reps  Patient Education - What You Can Do to Prevent Falls   ASSESSMENT:  CLINICAL IMPRESSION: Today we reassessed the ABC scale, patient did not have her glassless so I had to read out each question to her on the ABC scale. We assessed a FGA today, showing a score of 13/30 showing a high fall risk. She does also demonstrate a slow gait velocity as well which increased her risk for falls. We also reassessed her pain levels and improvement with PT. Would recommend continued skilled PT to address dynamic balance deficits to improve function and decrease risk for falls.   EVAL:  Anastasiya Gowin is a 83 y.o. female who was referred to physical therapy for evaluation and treatment for gait abnormality.  Patient presents with physical impairments of impaired activity tolerance, impaired standing balance, impaired ambulation, and decreased safety awareness impacting safe and independent functional mobility.  Examination revealed patient is at risk for falls and functional decline as evidenced by the following objective test measures: Gait speed 1.98 ft/sec, (2.62 ft/sec is needed for community access), BERG score of 43/56;  TUG of 11.6 sec (>13.5 sec indicates increased  risk for falls), and 5xSTS of 18.96 sec (>15 sec indicates increased risk for falls and decreased BLE power).  ABC scale score of 48% indicates a moderate level of physical functioning.  [place] will benefit from skilled PT to address above deficits to improve mobility and activity tolerance to help reach the maximal level of functional independence and mobility. Patient demonstrates understanding of this POC and is in agreement with this plan.   OBJECTIVE IMPAIRMENTS: difficulty walking, decreased strength, postural dysfunction, and pain.   ACTIVITY LIMITATIONS: carrying, lifting, bending, squatting, and locomotion level  PARTICIPATION LIMITATIONS: cleaning, laundry, shopping, and community activity  PERSONAL FACTORS: Age, Fitness, and 1-2 comorbidities: Afib, Diabetes, neuropathy, retinopathy, osteoporosis, h/o falls, h/o L breast CA  are also affecting patient's functional outcome.   REHAB POTENTIAL: Good  CLINICAL DECISION MAKING: Evolving/moderate complexity  EVALUATION COMPLEXITY: Moderate   GOALS: Goals reviewed with patient? Yes  SHORT TERM GOALS: Target date: 10/10/2024   Patient will be independent with initial HEP to improve outcomes and carryover.  Baseline: 100% PT assist required for correct completion 09/27/24:  reviewed HEP and assist needed for recall and performance Goal status: MET- 10/21/24  2.  Patient will be educated on strategies to decrease risk of falls.  Baseline: no  education provided yet 09/27/24:  provided falls handout in [facility] Goal status: MET- 10/11/24   LONG TERM GOALS: Target date: 11/08/2024   Patient will be independent with advanced/ongoing HEP to facilitate ability to maintain/progress functional gains from skilled physical therapy services. Baseline: no advanced HEP yet Goal status: IN PROGRESS- 10/21/24   3.  Patient will be able to step up/down curb safely with LRAD for safety with community ambulation.  Baseline: able to do 1  flight of steps with 1 rail support Goal status: INITIAL   4.  Patient will demonstrate improved  BLE strength to >/= 5/5 for improved stability and ease of mobility Baseline: Refer to above LE MMT table Goal status: INITIAL  5.  Patient will improve 5xSTS time to </= 14 seconds for improved efficiency and safety with transfers. Baseline: 18.96 sec Goal status: INITIAL   6.  Patient will demonstrate gait speed of >/= 2.62 ft/sec (0.55 m/s) to be a safe limited community ambulator with decreased risk for recurrent falls.  Baseline: 1.98 Goal status: IN PROGRESS- 1.25 m/s 10/21/24  7.  Patient will improve Berg score to >/= 51/56 to improve safety and stability with ADLs in standing and reduce risk for falls. (MCID= 8 points)  Baseline: 43 Goal status: INITIAL  8.  Patient will demonstrate at least 19/24 on DGI to improve gait stability and reduce risk for falls. Baseline: TBD Goal status: INITIAL  9. Patient will improve FGA score to at least 19/30 to improve gait stability and reduce risk for falls. Baseline: TBD Goal status: IN PROGRESS- 10/21/24  10.  Patient will report >/= 70% on ABC scale (MCID = 19%) to demonstrate improved balance confidence with functional mobility and gait. Baseline: 48% Goal status: IN PROGRESS- 10/21/24 880 / 1600 = 55.0 %   PLAN:  PT FREQUENCY: 1-2x/week  PT DURATION: 8 weeks  PLANNED INTERVENTIONS: [id number]- PT Re-evaluation, 97750- Physical Performance Testing, 97110-Therapeutic exercises, 97530- Therapeutic activity, 97112- Neuromuscular re-education, 97535- Self Care, [id number]- Manual therapy, [phone]- Gait training, Patient/Family education, Balance training, and Stair training  PLAN FOR NEXT SESSION:  Add cognitive challenges with gait and tandem walk, braiding, single leg clocks   Date of referral: 09/16/24 Referring provider: Frann Mabel Mt, DO Referring diagnosis? R26.9 (ICD-10-CM) - Gait abnormality Treatment diagnosis? (if different  than referring diagnosis)  Difficulty in walking, not elsewhere classified  Muscle weakness (generalized)  Abnormal posture  What was this (referring dx) caused by? Fall and Ongoing Issue  Lysle of Condition: Chronic (continuous duration > 3 months)   Laterality: Both  Current Functional Measure Score: Other ABC scale: 880 / 1600 = 55.0 %  Objective measurements identify impairments when they are compared to normal values, the uninvolved extremity, and prior level of function.  [x]  Yes  []  No  Objective assessment of functional ability: Moderate functional limitations   Briefly describe symptoms: balance deficits when walking increasing risk of falls  How did symptoms start: >1 year ago, but worsened progressively over last few months   Average pain intensity:  Last 24 hours: 0/10  Past week: 0/10  How often does the pt experience symptoms? Constantly  How much have the symptoms interfered with usual daily activities? Moderately  How has condition changed since care began at this facility? A little better  In general, how is the patients overall health? Good  Onset date: over 1 year ago   BACK PAIN (STarT Back Screening Tool) - (When applicable):  Has your back pain spread down your  leg(s) at sometime in the last 2 weeks? []  Yes   []  No Have you had pain in the shoulder or neck at sometime in the past 2 weeks? []  Yes   []  No Have you only walked short distances because of your back pain? []  Yes   []  No In the past 2 weeks, have you dressed more slowly than usual because of your back pain? []  Yes   []  No Do you think it is not really safe for person with a condition like yours to be physically active? []  Yes   []  No Have worrying thoughts been going through your mind a lot of the time? []  Yes   []  No Do you feel that your back pain is terrible and it is never going to get any better? []  Yes   []  No In general, have you stopped enjoying all the things you  usually enjoy? []  Yes   []  No Overall, how bothersome has your back pain been in the last 2 weeks? []  Not at all   []  Slightly     []  Moderate   []  Very much     []  Extremely      Sol LITTIE Gaskins, PTA 10/21/2024, 12:16 PM

## 2024-10-25 ENCOUNTER — Encounter: Payer: Self-pay | Admitting: Rehabilitation

## 2024-10-25 ENCOUNTER — Ambulatory Visit: Admitting: Rehabilitation

## 2024-10-25 DIAGNOSIS — M6281 Muscle weakness (generalized): Secondary | ICD-10-CM

## 2024-10-25 DIAGNOSIS — R262 Difficulty in walking, not elsewhere classified: Secondary | ICD-10-CM | POA: Diagnosis not present

## 2024-10-25 DIAGNOSIS — R293 Abnormal posture: Secondary | ICD-10-CM

## 2024-10-25 NOTE — Therapy (Signed)
 OUTPATIENT PHYSICAL THERAPY NEURO TREATMENT   Patient Name: Vickie Burns MRN: [id number] DOB:12-11-1940, 83 y.o., female Today's Date: 10/25/2024   END OF SESSION:  PT End of Session - 10/25/24 1118     Visit Number 7    Date for Recertification  11/08/24    PT Start Time 1102    PT Stop Time 1148    PT Time Calculation (min) 46 min    Activity Tolerance Patient tolerated treatment well;No increased pain    Behavior During Therapy [facility] for tasks assessed/performed              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   Essential hypertension 10/07/2016   Fall at home, initial encounter 04/05/2022   Family history of breast cancer    aunt   History of adenomatous polyp of colon 02/12/2021   HLD (hyperlipidemia) 08/17/2012   Hypercholesteremia 2000   Hypercholesterolemia 2000   Hypertension    history   Long term (current) use of anticoagulants 12/14/2021   Long term (current) use of insulin  (HCC) 02/12/2021   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   New onset a-fib (HCC) 06/28/2020   Osteoporosis 09/06/2016   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   Persistent atrial fibrillation (HCC) 06/07/2021   Personal history of malignant neoplasm of breast 02/12/2021   Personal history of radiation therapy    Postmenopausal    took HRT 1999 - 2004   Preop cardiovascular exam 07/03/2020   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   Traumatic  ecchymosis of right lower leg 04/05/2022   Unspecified atrial fibrillation (HCC) 07/03/2020   Past Surgical History:  Procedure Laterality Date   BREAST BIOPSY     BREAST BIOPSY Left 10/29/2022   MM LT BREAST BX W LOC DEV 1ST LESION IMAGE BX SPEC STEREO GUIDE 10/29/2022 [facility]   BREAST EXCISIONAL BIOPSY Left 09/07/2020   BREAST LUMPECTOMY  04/2009   rt breast  estrogen +, Her 2 Nu negative   BREAST LUMPECTOMY WITH RADIOACTIVE SEED LOCALIZATION Left 09/07/2020   Procedure: RADIOCATIVE SEED GUIDED LEFT BREAST LUMPECTOMY;  Surgeon: Ethyl Lenis, MD;  Location: [facility];  Service: General;  Laterality: Left;   COLONOSCOPY WITH PROPOFOL  N/A 04/05/2014   Procedure: COLONOSCOPY WITH PROPOFOL ;  Surgeon: Gladis MARLA Louder, MD;  Location: [facility];  Service: Endoscopy;  Laterality: N/A;   ORIF FOOT FRACTURE Right 04/20/2014   done in [facility] TOOTH EXTRACTION     Patient Active Problem List   Diagnosis Date Noted   Nuclear sclerotic cataract of left eye 02/26/2024   Osteopenia of left forearm 12/09/2023   Well woman exam with routine gynecological exam 12/04/2023   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   Acute blood loss anemia 04/05/2022   Traumatic ecchymosis of right lower leg 04/05/2022   Fall at home, initial encounter 04/05/2022   Long term (current) use of anticoagulants 12/14/2021   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   Coronary arteriosclerosis 09/25/2021   Persistent atrial fibrillation (HCC) 06/07/2021  Diabetic peripheral neuropathy associated with type 2 diabetes mellitus (HCC) 02/12/2021   Diabetic retinopathy associated with type 2 diabetes mellitus (HCC) 02/12/2021   History of adenomatous polyp of colon 02/12/2021   Long term (current) use of insulin  (HCC) 02/12/2021   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   Personal history of malignant neoplasm of breast 02/12/2021   Atrial fibrillation (HCC) 02/12/2021   Family history of  breast cancer    Hypertension    Personal history of radiation therapy    Postmenopausal    Diabetes mellitus due to underlying condition with unspecified complications (HCC) 07/03/2020   Unspecified atrial fibrillation (HCC) 07/03/2020   New onset a-fib (HCC) 06/28/2020   Essential hypertension 10/07/2016   Osteoporosis 09/06/2016   Macular degeneration 2016   HLD (hyperlipidemia) 08/17/2012   Diabetes mellitus, type 2 (HCC) 01/27/2012   Breast CA (HCC) 12/20/2011   Hypercholesterolemia 2000   Hypercholesteremia 2000    PCP: Frann Mabel Mt, DO   REFERRING PROVIDER: Frann Mabel Mt, DO   REFERRING DIAG: R26.9 (ICD-10-CM) - Gait abnormality  THERAPY DIAG:  Difficulty in walking, not elsewhere classified  Muscle weakness (generalized)  Abnormal posture  RATIONALE FOR EVALUATION AND TREATMENT: Rehabilitation  ONSET DATE: >1 year, but worsening progressively over last few months  NEXT MD VISIT:    SUBJECTIVE:                                                                                                                                                                                                         SUBJECTIVE STATEMENT: States feeling ok.  States still cloudy vision in the L eye which is impacting her balance.   However, she states she can tell some difference in her balance since starting PT.   She is doing her HEP some, but not as regularly as she could admittedly.  83 y/o female referred to PT from PCP for gait abnormality.   She is also having some memory deficits and brain MRI is pending.  She is also supposed to see a neurologist  Pt accompanied by: self  PAIN: Are you having pain? No  PERTINENT HISTORY:  Afib, Diabetes, neuropathy, retinopathy, osteoporosis, h/o falls, h/o L breast CA   PRECAUTIONS: Other: L breast surgery/lumpectomy  RED FLAGS: None  WEIGHT BEARING RESTRICTIONS: No  FALLS:  Has patient fallen in last 6 months?  Yes. Number of falls several  LIVING ENVIRONMENT: Lives with: lives alone Lives in: House/apartment Stairs: Yes: Internal: 14 steps; on right going up and External: 6 steps; bilateral but cannot reach both Has  following equipment at home: Single point cane, Walker - 2 wheeled, bed side commode, and Grab bars  OCCUPATION: works for [employer]  PLOF: Independent with gait  PATIENT GOALS: have better balance   OBJECTIVE: (objective measures completed at initial evaluation unless otherwise dated)  DIAGNOSTIC FINDINGS:  Brain MRI is pending  COGNITION: Overall cognitive status: Within functional limits for tasks assessed   SENSATION: WFL  COORDINATION: WNL  EDEMA:  Mild BLE  MUSCLE TONE: WNL  DTRs:  NT  POSTURE:  rounded shoulders and forward head     LOWER EXTREMITY ROM:    MMT Right eval Left eval  Hip flexion    Hip extension    Hip abduction    Hip adduction    Hip internal rotation    Hip external rotation    Knee flexion    Knee extension    Ankle dorsiflexion    Ankle plantarflexion    Ankle inversion    Ankle eversion    (Blank rows = not tested) LOWER EXTREMITY MMT:     Active  Right eval Left eval  Hip flexion 4 4  Hip extension    Hip abduction 4 4  Hip adduction    Hip internal rotation 4+ 4  Hip external rotation 4 4+  Knee flexion 5 5  Knee extension 5 5  Ankle dorsiflexion 4 4-  Ankle plantarflexion    Ankle inversion    Ankle eversion     (Blank rows = not tested) BED MOBILITY:  Rolling to Right CGA Rolling to Left Complete Independence  TRANSFERS: Assistive device utilized: None  Sit to stand: Modified independence Stand to sit: Modified independence Chair to chair: Modified independence Floor: NT  GAIT: Distance walked:  clinic distances and into clinic from parking lot Assistive device utilized: None Level of assistance: SBA Gait pattern: decreased step length- Right, decreased step length- Left, decreased  stance time- Right, decreased stance time- Left, decreased ankle dorsiflexion- Right, and decreased ankle dorsiflexion- Left Comments:   FUNCTIONAL TESTS:  PHYSICAL PERFORMANCE TEST or MEASUREMENT:   [facility] PT Assessment - 10/21/24 0001       Functional Gait  Assessment   Gait assessed  Yes    Gait Level Surface Walks 20 ft, slow speed, abnormal gait pattern, evidence for imbalance or deviates 10-15 in outside of the 12 in walkway width. Requires more than 7 sec to ambulate 20 ft.    Change in Gait Speed Makes only minor adjustments to walking speed, or accomplishes a change in speed with significant gait deviations, deviates 10-15 in outside the 12 in walkway width, or changes speed but loses balance but is able to recover and continue walking.    Gait with Horizontal Head Turns Performs head turns with moderate changes in gait velocity, slows down, deviates 10-15 in outside 12 in walkway width but recovers, can continue to walk.    Gait with Vertical Head Turns Performs task with slight change in gait velocity (eg, minor disruption to smooth gait path), deviates 6 - 10 in outside 12 in walkway width or uses assistive device    Gait and Pivot Turn Pivot turns safely within 3 sec and stops quickly with no loss of balance.    Step Over Obstacle Is able to step over one shoe box (4.5 in total height) without changing gait speed. No evidence of imbalance.    Gait with Narrow Base of Support Ambulates less than 4 steps heel to toe or cannot perform  without assistance.    Gait with Eyes Closed Walks 20 ft, slow speed, abnormal gait pattern, evidence for imbalance, deviates 10-15 in outside 12 in walkway width. Requires more than 9 sec to ambulate 20 ft.    Ambulating Backwards Walks 20 ft, slow speed, abnormal gait pattern, evidence for imbalance, deviates 10-15 in outside 12 in walkway width.    Steps Two feet to a stair, must use rail.    Total Score 13           Gait speed:  1.98 ft/sec 5x  STS = 18.96 sec TUG score = 11.6 sec   PATIENT SURVEYS:  ABC scale: The Activities-Specific Balance Confidence (ABC) Scale 0% 10 20 30  40 50 60 70 80 90 100% No confidence<->completely confident  How confident are you that you will not lose your balance or become unsteady when you . . .   Date tested 10/25/2024       10/21/24  Walk around the house 70%   2. Walk up or down stairs 70%   3. Bend over and pick up a slipper from in front of a closet floor 80%   4. Reach for a small can off a shelf at eye level 100%   5. Stand on tip toes and reach for something above your head 40%   6. Stand on a chair and reach for something 0%   7. Sweep the floor 80%   8. Walk outside the house to a car parked in the driveway 80%   9. Get into or out of a car 100%   10. Walk across a parking lot to the mall 80%   11. Walk up or down a ramp 0%   12. Walk in a crowded mall where people rapidly walk past you 80%   13. Are bumped into by people as you walk through the mall 0%   14. Step onto or off of an escalator while you are holding onto the railing 8011%   15. Step onto or off an escalator while holding onto parcels such that you cannot hold onto the railing 0%   16. Walk outside on icy sidewalks 0%   Total: #/16 780/1600 = 48% 880 / 1600 = 55.0 %      TODAY'S TREATMENT:  10/25/24 NEUROMUSCULAR RE-EDUCATION: To improve balance, coordination, kinesthesia, and proprioception. Walking backward counting by 2's x 150' Walking backward w/ head turned to the R counting by 2's x 150' Walking forward head turned to the L counting by 2's x 150' Tandem gait forward counting by 2's x 150' Long foam balance x 1' Long foam sidestepping x 8 laps S/S SLS w/ 1 finger support x 30 sec x 3 each leg Clock cone touch from (9:00 to 3:00) w/ PT calling out a time and patient having to touch appropriate cone x 5' BLE  THERAPEUTIC ACTIVITIES: To improve functional performance.  Demonstration, verbal and tactile cues  throughout for technique. Step up and across 4 aerobic step S/S X 10; F/B X 10  Sit to stand x 5   10/21/24 Nustep L5x40min ABC scale- 880 / 1600 = 55.0 %- reviewed with patient as she did not have her glasses  [facility] PT Assessment - 10/21/24 0001       Functional Gait  Assessment   Gait assessed  Yes    Gait Level Surface Walks 20 ft, slow speed, abnormal gait pattern, evidence for imbalance or deviates 10-15 in outside of the 12 in walkway  width. Requires more than 7 sec to ambulate 20 ft.    Change in Gait Speed Makes only minor adjustments to walking speed, or accomplishes a change in speed with significant gait deviations, deviates 10-15 in outside the 12 in walkway width, or changes speed but loses balance but is able to recover and continue walking.    Gait with Horizontal Head Turns Performs head turns with moderate changes in gait velocity, slows down, deviates 10-15 in outside 12 in walkway width but recovers, can continue to walk.    Gait with Vertical Head Turns Performs task with slight change in gait velocity (eg, minor disruption to smooth gait path), deviates 6 - 10 in outside 12 in walkway width or uses assistive device    Gait and Pivot Turn Pivot turns safely within 3 sec and stops quickly with no loss of balance.    Step Over Obstacle Is able to step over one shoe box (4.5 in total height) without changing gait speed. No evidence of imbalance.    Gait with Narrow Base of Support Ambulates less than 4 steps heel to toe or cannot perform without assistance.    Gait with Eyes Closed Walks 20 ft, slow speed, abnormal gait pattern, evidence for imbalance, deviates 10-15 in outside 12 in walkway width. Requires more than 9 sec to ambulate 20 ft.    Ambulating Backwards Walks 20 ft, slow speed, abnormal gait pattern, evidence for imbalance, deviates 10-15 in outside 12 in walkway width.    Steps Two feet to a stair, must use rail.    Total Score 13          10/14/24 THERAPEUTIC  EXERCISE: To improve strength and endurance.  Demonstration, verbal and tactile cues throughout for technique. Bike L3 x 7'  NEUROMUSCULAR RE-EDUCATION: To improve balance, coordination, kinesthesia, posture, and proprioception. Tandem gait F/B 30' x 4 each direction Braiding in front and behind 30' x 4 each way Foam sidestepping x 5 laps  4 step ups  w/ YTB resistance from back x 5, from each side x 5  for each leg w/ 1ue support  THERAPEUTIC ACTIVITIES: To improve functional performance.  Demonstration, verbal and tactile cues throughout for technique. - Up/down 1 flight of steps with R rail reciprocal pattern for ascending, nonreciprocal pattern for descent - seated at edge of table reaching laterally to touch floor x 10 each UE;  much more difficult going to the L - Table to floor transfer--patient preference is scooting to the edge of using both arms behind her on table to lower butt to floor (tricep method) min assist required -  Floor to table transfer with min assist for correct technique of side sitting with knees bent, reaching across body to get both hands on floor, getting to hands and knees, propping arms on table, bringing LLE up for 1/2 kneel and then to partial stand/squat pivot to table (patient would prefer to use back to table method with arms behind her for tricep push up, but this is not ideal for her) - standing toe prop gastroc stretch x 1' x 3 each leg   10/11/24 Bike L2x65min Leg curls 15lb x 20 BLE Leg extension 5lb x 20 BLE Step ups BLE 2 x 10 6 1HA Educated on preventing falls risk and provided handout Tandem gait along counter 4x with support; 4x no support Braiding backward along counter 2x with support; 2x no support  09/30/24 Bike L2x54min Step ups BLE x 10 6 1HA Lateral step ups  LLE x 10 1HA; RLE 2HA x 10 Standing hip abduction 2lb 2 x 10 BLE Standing hip extension 2lb 2 x 10 BLE Standing marching 2lb 2 x 10 BLE Seated LAQ 2lb 2x10 BLE Seated SLR 2lb  2x10 BLE  09/27/24 THERAPEUTIC EXERCISE: To improve strength and endurance.  Demonstration, verbal and tactile cues throughout for technique. NuStep L4 x 6'   NEUROMUSCULAR RE-EDUCATION: To improve balance, coordination, kinesthesia, and posture. Corner balance:  Toe raises x 20  Heel raises x 20  Marching x 20 BLE  EC stance x 1'   Tandem stance x 1' BLE  Step stance EO x 1' each leg;  EC x 1' ea leg  Theraband foam standing heel and toes off the edges EO x 1'; EC x 1' Tandem gait at counter F/B X 3 LAPS Standing rhythmic stabilization w/ black TB around waist pulling in all directions for challenges Seated on 75 cm swiss ball:  Bouncing EO x 1';  EC x 1;  Rocking F/B x 20;  S/S x 20  Pelvic circles x 20 CW;  x20 CCW  Alternate marching x 10  B shoulder flexion x 10  Alternate LAQ x 10  09/13/24 SELF CARE: Provided education on PT POC progression.and initial HEP   PATIENT EDUCATION:  Education details: HEP review  Person educated: Patient Education method: Explanation, Demonstration, Verbal cues, Tactile cues, and Handouts Education comprehension: verbalized understanding, verbal cues required, tactile cues required, and needs further education  HOME EXERCISE PROGRAM: Access Code: [email or web address] URL: [email or web address] Date: 10/11/2024 Prepared by: Sol Gaskins  Exercises - Tandem Stance in Corner  - 1 x daily - 7 x weekly - 3 sets - 10 reps - Single Leg Stance  - 1 x daily - 7 x weekly - 3 sets - 10 reps - Heel Raises with Counter Support  - 1 x daily - 7 x weekly - 3 sets - 10 reps - Toe Raises with Counter Support  - 1 x daily - 7 x weekly - 3 sets - 10 reps  Patient Education - What You Can Do to Prevent Falls   ASSESSMENT:  CLINICAL IMPRESSION: Patient is making some improvements, but they are slow.   She is encouraged to work on her HEP in its entirety daily.   She still has difficulty weight shifting quickly to the L to move her RLE with  certain tasks.  She exhibits multiple losses of balance with self correction today with most exercises.  PT remains necessary to address deficits in balance, gait, strength, posture, HEP.   Continue per POC  EVAL:  Brytani Voth is a 83 y.o. female who was referred to physical therapy for evaluation and treatment for gait abnormality.  Patient presents with physical impairments of impaired activity tolerance, impaired standing balance, impaired ambulation, and decreased safety awareness impacting safe and independent functional mobility.  Examination revealed patient is at risk for falls and functional decline as evidenced by the following objective test measures: Gait speed 1.98 ft/sec, (2.62 ft/sec is needed for community access), BERG score of 43/56;  TUG of 11.6 sec (>13.5 sec indicates increased risk for falls), and 5xSTS of 18.96 sec (>15 sec indicates increased risk for falls and decreased BLE power).  ABC scale score of 48% indicates a moderate level of physical functioning.  Tsion will benefit from skilled PT to address above deficits to improve mobility and activity tolerance to help reach the maximal level of functional independence and mobility. Patient demonstrates understanding  of this POC and is in agreement with this plan.   OBJECTIVE IMPAIRMENTS: difficulty walking, decreased strength, postural dysfunction, and pain.   ACTIVITY LIMITATIONS: carrying, lifting, bending, squatting, and locomotion level  PARTICIPATION LIMITATIONS: cleaning, laundry, shopping, and community activity  PERSONAL FACTORS: Age, Fitness, and 1-2 comorbidities: Afib, Diabetes, neuropathy, retinopathy, osteoporosis, h/o falls, h/o L breast CA  are also affecting patient's functional outcome.   REHAB POTENTIAL: Good  CLINICAL DECISION MAKING: Evolving/moderate complexity  EVALUATION COMPLEXITY: Moderate   GOALS: Goals reviewed with patient? Yes  SHORT TERM GOALS: Target date: 10/10/2024   Patient  will be independent with initial HEP to improve outcomes and carryover.  Baseline: 100% PT assist required for correct completion 09/27/24:  reviewed HEP and assist needed for recall and performance Goal status: MET- 10/21/24  2.  Patient will be educated on strategies to decrease risk of falls.  Baseline: no education provided yet 09/27/24:  provided falls handout in [facility] Goal status: MET- 10/11/24   LONG TERM GOALS: Target date: 11/08/2024   Patient will be independent with advanced/ongoing HEP to facilitate ability to maintain/progress functional gains from skilled physical therapy services. Baseline: no advanced HEP yet Goal status: IN PROGRESS- 10/21/24   3.  Patient will be able to step up/down curb safely with LRAD for safety with community ambulation.  Baseline: able to do 1 flight of steps with 1 rail support Goal status: INITIAL   4.  Patient will demonstrate improved  BLE strength to >/= 5/5 for improved stability and ease of mobility Baseline: Refer to above LE MMT table Goal status: INITIAL  5.  Patient will improve 5xSTS time to </= 14 seconds for improved efficiency and safety with transfers. Baseline: 18.96 sec Goal status: INITIAL   6.  Patient will demonstrate gait speed of >/= 2.62 ft/sec (0.55 m/s) to be a safe limited community ambulator with decreased risk for recurrent falls.  Baseline: 1.98 Goal status: IN PROGRESS- 1.25 m/s 10/21/24  7.  Patient will improve Berg score to >/= 51/56 to improve safety and stability with ADLs in standing and reduce risk for falls. (MCID= 8 points)  Baseline: 43 Goal status: INITIAL  8.  Patient will demonstrate at least 19/24 on DGI to improve gait stability and reduce risk for falls. Baseline: TBD Goal status: INITIAL  9. Patient will improve FGA score to at least 19/30 to improve gait stability and reduce risk for falls. Baseline: TBD Goal status: IN PROGRESS- 10/21/24  10.  Patient will report >/= 70% on ABC  scale (MCID = 19%) to demonstrate improved balance confidence with functional mobility and gait. Baseline: 48% Goal status: IN PROGRESS- 10/21/24 880 / 1600 = 55.0 %   PLAN:  PT FREQUENCY: 1-2x/week  PT DURATION: 8 weeks  PLANNED INTERVENTIONS: [id number]- PT Re-evaluation, 97750- Physical Performance Testing, 97110-Therapeutic exercises, 97530- Therapeutic activity, 97112- Neuromuscular re-education, 97535- Self Care, [id number]- Manual therapy, [id number]- Gait training, Patient/Family education, Balance training, and Stair training  PLAN FOR NEXT SESSION:  Add cognitive challenges with gait and tandem walk, braiding, single leg clocks   Date of referral: 09/16/24 Referring provider: Frann Mabel Mt, DO Referring diagnosis? R26.9 (ICD-10-CM) - Gait abnormality Treatment diagnosis? (if different than referring diagnosis)  Difficulty in walking, not elsewhere classified  Muscle weakness (generalized)  Abnormal posture  What was this (referring dx) caused by? Fall and Ongoing Issue  Lysle of Condition: Chronic (continuous duration > 3 months)   Laterality: Both  Current Functional Measure Score: Other ABC scale: 880 /  1600 = 55.0 %  Objective measurements identify impairments when they are compared to normal values, the uninvolved extremity, and prior level of function.  [x]  Yes  []  No  Objective assessment of functional ability: Moderate functional limitations   Briefly describe symptoms: balance deficits when walking increasing risk of falls  How did symptoms start: >1 year ago, but worsened progressively over last few months   Average pain intensity:  Last 24 hours: 0/10  Past week: 0/10  How often does the pt experience symptoms? Constantly  How much have the symptoms interfered with usual daily activities? Moderately  How has condition changed since care began at this facility? A little better  In general, how is the patients overall health? Good  Onset date: over  1 year ago   BACK PAIN (STarT Back Screening Tool) - (When applicable):  Has your back pain spread down your leg(s) at sometime in the last 2 weeks? []  Yes   []  No Have you had pain in the shoulder or neck at sometime in the past 2 weeks? []  Yes   []  No Have you only walked short distances because of your back pain? []  Yes   []  No In the past 2 weeks, have you dressed more slowly than usual because of your back pain? []  Yes   []  No Do you think it is not really safe for person with a condition like yours to be physically active? []  Yes   []  No Have worrying thoughts been going through your mind a lot of the time? []  Yes   []  No Do you feel that your back pain is terrible and it is never going to get any better? []  Yes   []  No In general, have you stopped enjoying all the things you usually enjoy? []  Yes   []  No Overall, how bothersome has your back pain been in the last 2 weeks? []  Not at all   []  Slightly     []  Moderate   []  Very much     []  Extremely      Philip Kotlyar, PT 10/25/2024, 1:13 PM

## 2024-10-28 ENCOUNTER — Ambulatory Visit

## 2024-10-28 DIAGNOSIS — R262 Difficulty in walking, not elsewhere classified: Secondary | ICD-10-CM | POA: Diagnosis not present

## 2024-10-28 DIAGNOSIS — R293 Abnormal posture: Secondary | ICD-10-CM

## 2024-10-28 DIAGNOSIS — M6281 Muscle weakness (generalized): Secondary | ICD-10-CM

## 2024-10-28 NOTE — Therapy (Signed)
 OUTPATIENT PHYSICAL THERAPY NEURO TREATMENT   Patient Name: Vickie Burns MRN: [id number] DOB:06-25-1941, 83 y.o., female Today's Date: 10/28/2024   END OF SESSION:  PT End of Session - 10/28/24 1212     Visit Number 8    Date for Recertification  11/08/24    PT Start Time 1104    PT Stop Time 1150    PT Time Calculation (min) 46 min    Activity Tolerance Patient tolerated treatment well;No increased pain    Behavior During Therapy [facility] for tasks assessed/performed               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   Essential hypertension 10/07/2016   Fall at home, initial encounter 04/05/2022   Family history of breast cancer    aunt   History of adenomatous polyp of colon 02/12/2021   HLD (hyperlipidemia) 08/17/2012   Hypercholesteremia 2000   Hypercholesterolemia 2000   Hypertension    history   Long term (current) use of anticoagulants 12/14/2021   Long term (current) use of insulin  (HCC) 02/12/2021   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   New onset a-fib (HCC) 06/28/2020   Osteoporosis 09/06/2016   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   Persistent atrial fibrillation (HCC) 06/07/2021   Personal history of malignant neoplasm of breast 02/12/2021   Personal history of radiation therapy    Postmenopausal    took HRT 1999 - 2004   Preop cardiovascular exam 07/03/2020   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   Traumatic  ecchymosis of right lower leg 04/05/2022   Unspecified atrial fibrillation (HCC) 07/03/2020   Past Surgical History:  Procedure Laterality Date   BREAST BIOPSY     BREAST BIOPSY Left 10/29/2022   MM LT BREAST BX W LOC DEV 1ST LESION IMAGE BX SPEC STEREO GUIDE 10/29/2022 [facility]   BREAST EXCISIONAL BIOPSY Left 09/07/2020   BREAST LUMPECTOMY  04/2009   rt breast  estrogen +, Her 2 Nu negative   BREAST LUMPECTOMY WITH RADIOACTIVE SEED LOCALIZATION Left 09/07/2020   Procedure: RADIOCATIVE SEED GUIDED LEFT BREAST LUMPECTOMY;  Surgeon: Ethyl Lenis, MD;  Location: [facility];  Service: General;  Laterality: Left;   COLONOSCOPY WITH PROPOFOL  N/A 04/05/2014   Procedure: COLONOSCOPY WITH PROPOFOL ;  Surgeon: Gladis MARLA Louder, MD;  Location: [facility];  Service: Endoscopy;  Laterality: N/A;   ORIF FOOT FRACTURE Right 04/20/2014   done in [facility] TOOTH EXTRACTION     Patient Active Problem List   Diagnosis Date Noted   Nuclear sclerotic cataract of left eye 02/26/2024   Osteopenia of left forearm 12/09/2023   Well woman exam with routine gynecological exam 12/04/2023   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   Acute blood loss anemia 04/05/2022   Traumatic ecchymosis of right lower leg 04/05/2022   Fall at home, initial encounter 04/05/2022   Long term (current) use of anticoagulants 12/14/2021   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   Coronary arteriosclerosis 09/25/2021   Persistent atrial fibrillation (HCC) 06/07/2021  Diabetic peripheral neuropathy associated with type 2 diabetes mellitus (HCC) 02/12/2021   Diabetic retinopathy associated with type 2 diabetes mellitus (HCC) 02/12/2021   History of adenomatous polyp of colon 02/12/2021   Long term (current) use of insulin  (HCC) 02/12/2021   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   Personal history of malignant neoplasm of breast 02/12/2021   Atrial fibrillation (HCC) 02/12/2021   Family history of  breast cancer    Hypertension    Personal history of radiation therapy    Postmenopausal    Diabetes mellitus due to underlying condition with unspecified complications (HCC) 07/03/2020   Unspecified atrial fibrillation (HCC) 07/03/2020   New onset a-fib (HCC) 06/28/2020   Essential hypertension 10/07/2016   Osteoporosis 09/06/2016   Macular degeneration 2016   HLD (hyperlipidemia) 08/17/2012   Diabetes mellitus, type 2 (HCC) 01/27/2012   Breast CA (HCC) 12/20/2011   Hypercholesterolemia 2000   Hypercholesteremia 2000    PCP: Frann Mabel Mt, DO   REFERRING PROVIDER: Frann Mabel Mt, DO   REFERRING DIAG: R26.9 (ICD-10-CM) - Gait abnormality  THERAPY DIAG:  Difficulty in walking, not elsewhere classified  Muscle weakness (generalized)  Abnormal posture  RATIONALE FOR EVALUATION AND TREATMENT: Rehabilitation  ONSET DATE: >1 year, but worsening progressively over last few months  NEXT MD VISIT:    SUBJECTIVE:                                                                                                                                                                                                         SUBJECTIVE STATEMENT: States feeling ok.  States still cloudy vision in the L eye which is impacting her balance.   However, she states she can tell some difference in her balance since starting PT.   She is doing her HEP some, but not as regularly as she could admittedly.  82 y/o female referred to PT from PCP for gait abnormality.   She is also having some memory deficits and brain MRI is pending.  She is also supposed to see a neurologist  Pt accompanied by: self  PAIN: Are you having pain? No  PERTINENT HISTORY:  Afib, Diabetes, neuropathy, retinopathy, osteoporosis, h/o falls, h/o L breast CA   PRECAUTIONS: Other: L breast surgery/lumpectomy  RED FLAGS: None  WEIGHT BEARING RESTRICTIONS: No  FALLS:  Has patient fallen in last 6 months?  Yes. Number of falls several  LIVING ENVIRONMENT: Lives with: lives alone Lives in: House/apartment Stairs: Yes: Internal: 14 steps; on right going up and External: 6 steps; bilateral but cannot reach both Has  following equipment at home: Single point cane, Walker - 2 wheeled, bed side commode, and Grab bars  OCCUPATION: works for [employer]  PLOF: Independent with gait  PATIENT GOALS: have better balance   OBJECTIVE: (objective measures completed at initial evaluation unless otherwise dated)  DIAGNOSTIC FINDINGS:  Brain MRI is pending  COGNITION: Overall cognitive status: Within functional limits for tasks assessed   SENSATION: WFL  COORDINATION: WNL  EDEMA:  Mild BLE  MUSCLE TONE: WNL  DTRs:  NT  POSTURE:  rounded shoulders and forward head     LOWER EXTREMITY ROM:    MMT Right eval Left eval  Hip flexion    Hip extension    Hip abduction    Hip adduction    Hip internal rotation    Hip external rotation    Knee flexion    Knee extension    Ankle dorsiflexion    Ankle plantarflexion    Ankle inversion    Ankle eversion    (Blank rows = not tested) LOWER EXTREMITY MMT:     Active  Right eval Left eval  Hip flexion 4 4  Hip extension    Hip abduction 4 4  Hip adduction    Hip internal rotation 4+ 4  Hip external rotation 4 4+  Knee flexion 5 5  Knee extension 5 5  Ankle dorsiflexion 4 4-  Ankle plantarflexion    Ankle inversion    Ankle eversion     (Blank rows = not tested) BED MOBILITY:  Rolling to Right CGA Rolling to Left Complete Independence  TRANSFERS: Assistive device utilized: None  Sit to stand: Modified independence Stand to sit: Modified independence Chair to chair: Modified independence Floor: NT  GAIT: Distance walked:  clinic distances and into clinic from parking lot Assistive device utilized: None Level of assistance: SBA Gait pattern: decreased step length- Right, decreased step length- Left, decreased  stance time- Right, decreased stance time- Left, decreased ankle dorsiflexion- Right, and decreased ankle dorsiflexion- Left Comments:   FUNCTIONAL TESTS:  PHYSICAL PERFORMANCE TEST or MEASUREMENT:   [facility] PT Assessment - 10/21/24 0001       Functional Gait  Assessment   Gait assessed  Yes    Gait Level Surface Walks 20 ft, slow speed, abnormal gait pattern, evidence for imbalance or deviates 10-15 in outside of the 12 in walkway width. Requires more than 7 sec to ambulate 20 ft.    Change in Gait Speed Makes only minor adjustments to walking speed, or accomplishes a change in speed with significant gait deviations, deviates 10-15 in outside the 12 in walkway width, or changes speed but loses balance but is able to recover and continue walking.    Gait with Horizontal Head Turns Performs head turns with moderate changes in gait velocity, slows down, deviates 10-15 in outside 12 in walkway width but recovers, can continue to walk.    Gait with Vertical Head Turns Performs task with slight change in gait velocity (eg, minor disruption to smooth gait path), deviates 6 - 10 in outside 12 in walkway width or uses assistive device    Gait and Pivot Turn Pivot turns safely within 3 sec and stops quickly with no loss of balance.    Step Over Obstacle Is able to step over one shoe box (4.5 in total height) without changing gait speed. No evidence of imbalance.    Gait with Narrow Base of Support Ambulates less than 4 steps heel to toe or cannot perform  without assistance.    Gait with Eyes Closed Walks 20 ft, slow speed, abnormal gait pattern, evidence for imbalance, deviates 10-15 in outside 12 in walkway width. Requires more than 9 sec to ambulate 20 ft.    Ambulating Backwards Walks 20 ft, slow speed, abnormal gait pattern, evidence for imbalance, deviates 10-15 in outside 12 in walkway width.    Steps Two feet to a stair, must use rail.    Total Score 13           Gait speed:  1.98 ft/sec 5x  STS = 18.96 sec TUG score = 11.6 sec   PATIENT SURVEYS:  ABC scale: The Activities-Specific Balance Confidence (ABC) Scale 0% 10 20 30  40 50 60 70 80 90 100% No confidence<->completely confident  How confident are you that you will not lose your balance or become unsteady when you . . .   Date tested 10/28/2024       10/21/24  Walk around the house 70%   2. Walk up or down stairs 70%   3. Bend over and pick up a slipper from in front of a closet floor 80%   4. Reach for a small can off a shelf at eye level 100%   5. Stand on tip toes and reach for something above your head 40%   6. Stand on a chair and reach for something 0%   7. Sweep the floor 80%   8. Walk outside the house to a car parked in the driveway 80%   9. Get into or out of a car 100%   10. Walk across a parking lot to the mall 80%   11. Walk up or down a ramp 0%   12. Walk in a crowded mall where people rapidly walk past you 80%   13. Are bumped into by people as you walk through the mall 0%   14. Step onto or off of an escalator while you are holding onto the railing 8011%   15. Step onto or off an escalator while holding onto parcels such that you cannot hold onto the railing 0%   16. Walk outside on icy sidewalks 0%   Total: #/16 780/1600 = 48% 880 / 1600 = 55.0 %      TODAY'S TREATMENT:  10/28/24 Bike L2x31min Standing gastroc stretch BLE foot on foam roll 2 x 1 min  Step ups and over 4 inch step: fwd BLE x 10 no hands, lateral no hands x 10 SLS color touches with BLE no support therapist beside providing CGA, added cognitive challenges with calling out specific LE to specific dot Sit to stand with min assist x 5  Squats x 10 with UE support Standing hip flex to abd 3lb x 10 BLE   10/25/24 NEUROMUSCULAR RE-EDUCATION: To improve balance, coordination, kinesthesia, and proprioception. Walking backward counting by 2's x 150' Walking backward w/ head turned to the R counting by 2's x 150' Walking forward head  turned to the L counting by 2's x 150' Tandem gait forward counting by 2's x 150' Long foam balance x 1' Long foam sidestepping x 8 laps S/S SLS w/ 1 finger support x 30 sec x 3 each leg Clock cone touch from (9:00 to 3:00) w/ PT calling out a time and patient having to touch appropriate cone x 5' BLE  THERAPEUTIC ACTIVITIES: To improve functional performance.  Demonstration, verbal and tactile cues throughout for technique. Step up and across 4 aerobic step S/S X 10;  F/B X 10  Sit to stand x 5   10/21/24 Nustep L5x45min ABC scale- 880 / 1600 = 55.0 %- reviewed with patient as she did not have her glasses  [facility] PT Assessment - 10/21/24 0001       Functional Gait  Assessment   Gait assessed  Yes    Gait Level Surface Walks 20 ft, slow speed, abnormal gait pattern, evidence for imbalance or deviates 10-15 in outside of the 12 in walkway width. Requires more than 7 sec to ambulate 20 ft.    Change in Gait Speed Makes only minor adjustments to walking speed, or accomplishes a change in speed with significant gait deviations, deviates 10-15 in outside the 12 in walkway width, or changes speed but loses balance but is able to recover and continue walking.    Gait with Horizontal Head Turns Performs head turns with moderate changes in gait velocity, slows down, deviates 10-15 in outside 12 in walkway width but recovers, can continue to walk.    Gait with Vertical Head Turns Performs task with slight change in gait velocity (eg, minor disruption to smooth gait path), deviates 6 - 10 in outside 12 in walkway width or uses assistive device    Gait and Pivot Turn Pivot turns safely within 3 sec and stops quickly with no loss of balance.    Step Over Obstacle Is able to step over one shoe box (4.5 in total height) without changing gait speed. No evidence of imbalance.    Gait with Narrow Base of Support Ambulates less than 4 steps heel to toe or cannot perform without assistance.    Gait with Eyes  Closed Walks 20 ft, slow speed, abnormal gait pattern, evidence for imbalance, deviates 10-15 in outside 12 in walkway width. Requires more than 9 sec to ambulate 20 ft.    Ambulating Backwards Walks 20 ft, slow speed, abnormal gait pattern, evidence for imbalance, deviates 10-15 in outside 12 in walkway width.    Steps Two feet to a stair, must use rail.    Total Score 13          10/14/24 THERAPEUTIC EXERCISE: To improve strength and endurance.  Demonstration, verbal and tactile cues throughout for technique. Bike L3 x 7'  NEUROMUSCULAR RE-EDUCATION: To improve balance, coordination, kinesthesia, posture, and proprioception. Tandem gait F/B 30' x 4 each direction Braiding in front and behind 30' x 4 each way Foam sidestepping x 5 laps  4 step ups  w/ YTB resistance from back x 5, from each side x 5  for each leg w/ 1ue support  THERAPEUTIC ACTIVITIES: To improve functional performance.  Demonstration, verbal and tactile cues throughout for technique. - Up/down 1 flight of steps with R rail reciprocal pattern for ascending, nonreciprocal pattern for descent - seated at edge of table reaching laterally to touch floor x 10 each UE;  much more difficult going to the L - Table to floor transfer--patient preference is scooting to the edge of using both arms behind her on table to lower butt to floor (tricep method) min assist required -  Floor to table transfer with min assist for correct technique of side sitting with knees bent, reaching across body to get both hands on floor, getting to hands and knees, propping arms on table, bringing LLE up for 1/2 kneel and then to partial stand/squat pivot to table (patient would prefer to use back to table method with arms behind her for tricep push up, but this is not  ideal for her) - standing toe prop gastroc stretch x 1' x 3 each leg   10/11/24 Bike L2x53min Leg curls 15lb x 20 BLE Leg extension 5lb x 20 BLE Step ups BLE 2 x 10 6 1HA Educated on  preventing falls risk and provided handout Tandem gait along counter 4x with support; 4x no support Braiding backward along counter 2x with support; 2x no support  09/30/24 Bike L2x57min Step ups BLE x 10 6 1HA Lateral step ups LLE x 10 1HA; RLE 2HA x 10 Standing hip abduction 2lb 2 x 10 BLE Standing hip extension 2lb 2 x 10 BLE Standing marching 2lb 2 x 10 BLE Seated LAQ 2lb 2x10 BLE Seated SLR 2lb 2x10 BLE  09/27/24 THERAPEUTIC EXERCISE: To improve strength and endurance.  Demonstration, verbal and tactile cues throughout for technique. NuStep L4 x 6'   NEUROMUSCULAR RE-EDUCATION: To improve balance, coordination, kinesthesia, and posture. Corner balance:  Toe raises x 20  Heel raises x 20  Marching x 20 BLE  EC stance x 1'   Tandem stance x 1' BLE  Step stance EO x 1' each leg;  EC x 1' ea leg  Theraband foam standing heel and toes off the edges EO x 1'; EC x 1' Tandem gait at counter F/B X 3 LAPS Standing rhythmic stabilization w/ black TB around waist pulling in all directions for challenges Seated on 75 cm swiss ball:  Bouncing EO x 1';  EC x 1;  Rocking F/B x 20;  S/S x 20  Pelvic circles x 20 CW;  x20 CCW  Alternate marching x 10  B shoulder flexion x 10  Alternate LAQ x 10  09/13/24 SELF CARE: Provided education on PT POC progression.and initial HEP   PATIENT EDUCATION:  Education details: HEP review  Person educated: Patient Education method: Explanation, Demonstration, Verbal cues, Tactile cues, and Handouts Education comprehension: verbalized understanding, verbal cues required, tactile cues required, and needs further education  HOME EXERCISE PROGRAM: Access Code: [email or web address] URL: [email or web address] Date: 10/28/2024 Prepared by: Sol Gaskins  Exercises - Tandem Stance in Corner  - 1 x daily - 7 x weekly - 3 sets - 10 reps - Single Leg Stance  - 1 x daily - 7 x weekly - 3 sets - 10 reps - Heel Raises with Counter Support  - 1 x daily -  7 x weekly - 3 sets - 10 reps - Toe Raises with Counter Support  - 1 x daily - 7 x weekly - 3 sets - 10 reps - Gastroc Stretch with Foot at Wall  - 1 x daily - 7 x weekly - 2 sets - 2 reps - 1 min hold  Patient Education - What You Can Do to Prevent Falls   ASSESSMENT:  CLINICAL IMPRESSION: Continued with working on balance activities adding cognitive challenges and strengthening. Pt is progressing with functional strength as she is able to navigate a small curb more easily. Added gastroc stretch to HEP. PT remains necessary to address deficits in balance, gait, strength, posture, HEP.   Continue per POC  EVAL:  Vickie Burns is a 84 y.o. female who was referred to physical therapy for evaluation and treatment for gait abnormality.  Patient presents with physical impairments of impaired activity tolerance, impaired standing balance, impaired ambulation, and decreased safety awareness impacting safe and independent functional mobility.  Examination revealed patient is at risk for falls and functional decline as evidenced by the following objective test measures: Gait speed 1.98  ft/sec, (2.62 ft/sec is needed for community access), BERG score of 43/56;  TUG of 11.6 sec (>13.5 sec indicates increased risk for falls), and 5xSTS of 18.96 sec (>15 sec indicates increased risk for falls and decreased BLE power).  ABC scale score of 48% indicates a moderate level of physical functioning.  Arnecia will benefit from skilled PT to address above deficits to improve mobility and activity tolerance to help reach the maximal level of functional independence and mobility. Patient demonstrates understanding of this POC and is in agreement with this plan.   OBJECTIVE IMPAIRMENTS: difficulty walking, decreased strength, postural dysfunction, and pain.   ACTIVITY LIMITATIONS: carrying, lifting, bending, squatting, and locomotion level  PARTICIPATION LIMITATIONS: cleaning, laundry, shopping, and community  activity  PERSONAL FACTORS: Age, Fitness, and 1-2 comorbidities: Afib, Diabetes, neuropathy, retinopathy, osteoporosis, h/o falls, h/o L breast CA  are also affecting patient's functional outcome.   REHAB POTENTIAL: Good  CLINICAL DECISION MAKING: Evolving/moderate complexity  EVALUATION COMPLEXITY: Moderate   GOALS: Goals reviewed with patient? Yes  SHORT TERM GOALS: Target date: 10/10/2024   Patient will be independent with initial HEP to improve outcomes and carryover.  Baseline: 100% PT assist required for correct completion 09/27/24:  reviewed HEP and assist needed for recall and performance Goal status: MET- 10/21/24  2.  Patient will be educated on strategies to decrease risk of falls.  Baseline: no education provided yet 09/27/24:  provided falls handout in [facility] Goal status: MET- 10/11/24   LONG TERM GOALS: Target date: 11/08/2024   Patient will be independent with advanced/ongoing HEP to facilitate ability to maintain/progress functional gains from skilled physical therapy services. Baseline: no advanced HEP yet Goal status: IN PROGRESS- 10/21/24   3.  Patient will be able to step up/down curb safely with LRAD for safety with community ambulation.  Baseline: able to do 1 flight of steps with 1 rail support Goal status: IN PROGRESS- 10/28/24- able to perform but unsteady as she steps off of curb/step  4.  Patient will demonstrate improved  BLE strength to >/= 5/5 for improved stability and ease of mobility Baseline: Refer to above LE MMT table Goal status: INITIAL  5.  Patient will improve 5xSTS time to </= 14 seconds for improved efficiency and safety with transfers. Baseline: 18.96 sec Goal status: INITIAL   6.  Patient will demonstrate gait speed of >/= 2.62 ft/sec (0.55 m/s) to be a safe limited community ambulator with decreased risk for recurrent falls.  Baseline: 1.98 Goal status: IN PROGRESS- 1.25 m/s 10/21/24  7.  Patient will improve Berg  score to >/= 51/56 to improve safety and stability with ADLs in standing and reduce risk for falls. (MCID= 8 points)  Baseline: 43 Goal status: INITIAL  8.  Patient will demonstrate at least 19/24 on DGI to improve gait stability and reduce risk for falls. Baseline: TBD Goal status: INITIAL  9. Patient will improve FGA score to at least 19/30 to improve gait stability and reduce risk for falls. Baseline: TBD Goal status: IN PROGRESS- 10/21/24  10.  Patient will report >/= 70% on ABC scale (MCID = 19%) to demonstrate improved balance confidence with functional mobility and gait. Baseline: 48% Goal status: IN PROGRESS- 10/21/24 880 / 1600 = 55.0 %   PLAN:  PT FREQUENCY: 1-2x/week  PT DURATION: 8 weeks  PLANNED INTERVENTIONS: [id number]- PT Re-evaluation, 97750- Physical Performance Testing, 97110-Therapeutic exercises, 97530- Therapeutic activity, [id number]- Neuromuscular re-education, 97535- Self Care, [id number]- Manual therapy, [phone]- Gait training, Patient/Family education, Balance training,  and Stair training  PLAN FOR NEXT SESSION:  Add cognitive challenges with gait and tandem walk, braiding, single leg clocks   Date of referral: 09/16/24 Referring provider: Frann Mabel Mt, DO Referring diagnosis? R26.9 (ICD-10-CM) - Gait abnormality Treatment diagnosis? (if different than referring diagnosis)  Difficulty in walking, not elsewhere classified  Muscle weakness (generalized)  Abnormal posture  What was this (referring dx) caused by? Fall and Ongoing Issue  Lysle of Condition: Chronic (continuous duration > 3 months)   Laterality: Both  Current Functional Measure Score: Other ABC scale: 880 / 1600 = 55.0 %  Objective measurements identify impairments when they are compared to normal values, the uninvolved extremity, and prior level of function.  [x]  Yes  []  No  Objective assessment of functional ability: Moderate functional limitations   Briefly describe symptoms:  balance deficits when walking increasing risk of falls  How did symptoms start: >1 year ago, but worsened progressively over last few months   Average pain intensity:  Last 24 hours: 0/10  Past week: 0/10  How often does the pt experience symptoms? Constantly  How much have the symptoms interfered with usual daily activities? Moderately  How has condition changed since care began at this facility? A little better  In general, how is the patients overall health? Good  Onset date: over 1 year ago   BACK PAIN (STarT Back Screening Tool) - (When applicable):  Has your back pain spread down your leg(s) at sometime in the last 2 weeks? []  Yes   []  No Have you had pain in the shoulder or neck at sometime in the past 2 weeks? []  Yes   []  No Have you only walked short distances because of your back pain? []  Yes   []  No In the past 2 weeks, have you dressed more slowly than usual because of your back pain? []  Yes   []  No Do you think it is not really safe for person with a condition like yours to be physically active? []  Yes   []  No Have worrying thoughts been going through your mind a lot of the time? []  Yes   []  No Do you feel that your back pain is terrible and it is never going to get any better? []  Yes   []  No In general, have you stopped enjoying all the things you usually enjoy? []  Yes   []  No Overall, how bothersome has your back pain been in the last 2 weeks? []  Not at all   []  Slightly     []  Moderate   []  Very much     []  Extremely      Sol LITTIE Gaskins, PTA 10/28/2024, 12:15 PM

## 2024-11-15 ENCOUNTER — Ambulatory Visit: Attending: Family Medicine

## 2024-11-15 DIAGNOSIS — R262 Difficulty in walking, not elsewhere classified: Secondary | ICD-10-CM | POA: Diagnosis present

## 2024-11-15 DIAGNOSIS — R293 Abnormal posture: Secondary | ICD-10-CM | POA: Insufficient documentation

## 2024-11-15 DIAGNOSIS — M6281 Muscle weakness (generalized): Secondary | ICD-10-CM | POA: Insufficient documentation

## 2024-11-15 NOTE — Therapy (Addendum)
 " OUTPATIENT PHYSICAL THERAPY NEURO TREATMENT / RECERTIFICATION    Patient Name: Ilya Ess MRN: [id number] DOB:November 28, 1940, 84 y.o., female Today's Date: 11/15/2024   END OF SESSION:  PT End of Session - 11/15/24 1408     Visit Number 9    Date for Recertification  11/08/24    PT Start Time 1316    PT Stop Time 1400    PT Time Calculation (min) 44 min    Activity Tolerance Patient tolerated treatment well;No increased pain    Behavior During Therapy [facility] for tasks assessed/performed                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   Essential hypertension 10/07/2016   Fall at home, initial encounter 04/05/2022   Family history of breast cancer    aunt   History of adenomatous polyp of colon 02/12/2021   HLD (hyperlipidemia) 08/17/2012   Hypercholesteremia 2000   Hypercholesterolemia 2000   Hypertension    history   Long term (current) use of anticoagulants 12/14/2021   Long term (current) use of insulin  (HCC) 02/12/2021   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   New onset a-fib (HCC) 06/28/2020   Osteoporosis 09/06/2016   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   Persistent atrial fibrillation (HCC) 06/07/2021   Personal history of malignant neoplasm of breast 02/12/2021   Personal history of radiation therapy    Postmenopausal    took HRT 1999 - 2004   Preop cardiovascular exam 07/03/2020   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    Traumatic ecchymosis of right lower leg 04/05/2022   Unspecified atrial fibrillation (HCC) 07/03/2020   Past Surgical History:  Procedure Laterality Date   BREAST BIOPSY     BREAST BIOPSY Left 10/29/2022   MM LT BREAST BX W LOC DEV 1ST LESION IMAGE BX SPEC STEREO GUIDE 10/29/2022 [facility]   BREAST EXCISIONAL BIOPSY Left 09/07/2020   BREAST LUMPECTOMY  04/2009   rt breast  estrogen +, Her 2 Nu negative   BREAST LUMPECTOMY WITH RADIOACTIVE SEED LOCALIZATION Left 09/07/2020   Procedure: RADIOCATIVE SEED GUIDED LEFT BREAST LUMPECTOMY;  Surgeon: Ethyl Lenis, MD;  Location: [facility];  Service: General;  Laterality: Left;   COLONOSCOPY WITH PROPOFOL  N/A 04/05/2014   Procedure: COLONOSCOPY WITH PROPOFOL ;  Surgeon: Gladis MARLA Louder, MD;  Location: [facility];  Service: Endoscopy;  Laterality: N/A;   ORIF FOOT FRACTURE Right 04/20/2014   done in [facility] TOOTH EXTRACTION     Patient Active Problem List   Diagnosis Date Noted   Nuclear sclerotic cataract of left eye 02/26/2024   Osteopenia of left forearm 12/09/2023   Well woman exam with routine gynecological exam 12/04/2023   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   Acute blood loss anemia 04/05/2022   Traumatic ecchymosis of right lower leg 04/05/2022   Fall at home, initial encounter 04/05/2022   Long term (current) use of anticoagulants 12/14/2021   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   Coronary arteriosclerosis 09/25/2021   Persistent  atrial fibrillation (HCC) 06/07/2021   Diabetic peripheral neuropathy associated with type 2 diabetes mellitus (HCC) 02/12/2021   Diabetic retinopathy associated with type 2 diabetes mellitus (HCC) 02/12/2021   History of adenomatous polyp of colon 02/12/2021   Long term (current) use of insulin  (HCC) 02/12/2021   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   Personal history of malignant neoplasm of breast 02/12/2021   Atrial fibrillation (HCC) 02/12/2021   Family  history of breast cancer    Hypertension    Personal history of radiation therapy    Postmenopausal    Diabetes mellitus due to underlying condition with unspecified complications (HCC) 07/03/2020   Unspecified atrial fibrillation (HCC) 07/03/2020   New onset a-fib (HCC) 06/28/2020   Essential hypertension 10/07/2016   Osteoporosis 09/06/2016   Macular degeneration 2016   HLD (hyperlipidemia) 08/17/2012   Diabetes mellitus, type 2 (HCC) 01/27/2012   Breast CA (HCC) 12/20/2011   Hypercholesterolemia 2000   Hypercholesteremia 2000    PCP: Frann Mabel Mt, DO   REFERRING PROVIDER: Frann Mabel Mt, DO   REFERRING DIAG: R26.9 (ICD-10-CM) - Gait abnormality  THERAPY DIAG:  Difficulty in walking, not elsewhere classified  Muscle weakness (generalized)  Abnormal posture  RATIONALE FOR EVALUATION AND TREATMENT: Rehabilitation  ONSET DATE: >1 year, but worsening progressively over last few months  NEXT MD VISIT:    SUBJECTIVE:                                                                                                                                                                                                         SUBJECTIVE STATEMENT: States feeling ok.  States still cloudy vision in the L eye which is impacting her balance.   However, she states she can tell some difference in her balance since starting PT.   She is doing her HEP some, but not as regularly as she could admittedly.   84 y/o female referred to PT from PCP for gait abnormality.   She is also having some memory deficits and brain MRI is pending.  She is also supposed to see a neurologist  Pt accompanied by: self  PAIN: Are you having pain? No  PERTINENT HISTORY:  Afib, Diabetes, neuropathy, retinopathy, osteoporosis, h/o falls, h/o L breast CA   PRECAUTIONS: Other: L breast surgery/lumpectomy  RED FLAGS: None  WEIGHT BEARING RESTRICTIONS: No  FALLS:  Has patient fallen in last  6 months? Yes. Number of falls several  LIVING ENVIRONMENT: Lives with: lives alone Lives in: House/apartment Stairs: Yes: Internal: 14 steps; on right going up and External: 6  steps; bilateral but cannot reach both Has following equipment at home: Single point cane, Walker - 2 wheeled, bed side commode, and Grab bars  OCCUPATION: works for [employer]  PLOF: Independent with gait  PATIENT GOALS: have better balance   OBJECTIVE: (objective measures completed at initial evaluation unless otherwise dated)  DIAGNOSTIC FINDINGS:  Brain MRI is pending  COGNITION: Overall cognitive status: Within functional limits for tasks assessed   SENSATION: WFL  COORDINATION: WNL  EDEMA:  Mild BLE  MUSCLE TONE: WNL  DTRs:  NT  POSTURE:  rounded shoulders and forward head     LOWER EXTREMITY ROM:    MMT Right eval Left eval  Hip flexion    Hip extension    Hip abduction    Hip adduction    Hip internal rotation    Hip external rotation    Knee flexion    Knee extension    Ankle dorsiflexion    Ankle plantarflexion    Ankle inversion    Ankle eversion    (Blank rows = not tested) LOWER EXTREMITY MMT:     Active  Right eval Left eval R 11/15/24 L 11/15/24  Hip flexion 4 4 4+ 4+  Hip extension      Hip abduction 4 4 5- sitting 5 sitting  Hip adduction      Hip internal rotation 4+ 4    Hip external rotation 4 4+    Knee flexion 5 5 4+ 4+  Knee extension 5 5 4+ 4+  Ankle dorsiflexion 4 4- 4+ 4  Ankle plantarflexion      Ankle inversion      Ankle eversion       (Blank rows = not tested) BED MOBILITY:  Rolling to Right CGA Rolling to Left Complete Independence  TRANSFERS: Assistive device utilized: None  Sit to stand: Modified independence Stand to sit: Modified independence Chair to chair: Modified independence Floor: NT  GAIT: Distance walked:  clinic distances and into clinic from parking lot Assistive device utilized: None Level of assistance:  SBA Gait pattern: decreased step length- Right, decreased step length- Left, decreased stance time- Right, decreased stance time- Left, decreased ankle dorsiflexion- Right, and decreased ankle dorsiflexion- Left Comments:   FUNCTIONAL TESTS:  PHYSICAL PERFORMANCE TEST or MEASUREMENT:   [facility] PT Assessment - 10/21/24 0001       Functional Gait  Assessment   Gait assessed  Yes    Gait Level Surface Walks 20 ft, slow speed, abnormal gait pattern, evidence for imbalance or deviates 10-15 in outside of the 12 in walkway width. Requires more than 7 sec to ambulate 20 ft.    Change in Gait Speed Makes only minor adjustments to walking speed, or accomplishes a change in speed with significant gait deviations, deviates 10-15 in outside the 12 in walkway width, or changes speed but loses balance but is able to recover and continue walking.    Gait with Horizontal Head Turns Performs head turns with moderate changes in gait velocity, slows down, deviates 10-15 in outside 12 in walkway width but recovers, can continue to walk.    Gait with Vertical Head Turns Performs task with slight change in gait velocity (eg, minor disruption to smooth gait path), deviates 6 - 10 in outside 12 in walkway width or uses assistive device    Gait and Pivot Turn Pivot turns safely within 3 sec and stops quickly with no loss of balance.    Step Over Obstacle Is able to  step over one shoe box (4.5 in total height) without changing gait speed. No evidence of imbalance.    Gait with Narrow Base of Support Ambulates less than 4 steps heel to toe or cannot perform without assistance.    Gait with Eyes Closed Walks 20 ft, slow speed, abnormal gait pattern, evidence for imbalance, deviates 10-15 in outside 12 in walkway width. Requires more than 9 sec to ambulate 20 ft.    Ambulating Backwards Walks 20 ft, slow speed, abnormal gait pattern, evidence for imbalance, deviates 10-15 in outside 12 in walkway width.    Steps Two feet to a  stair, must use rail.    Total Score 13           Gait speed:  1.98 ft/sec 5x STS = 18.96 sec TUG score = 11.6 sec   PATIENT SURVEYS:  ABC scale: The Activities-Specific Balance Confidence (ABC) Scale 0% 10 20 30  40 50 60 70 80 90 100% No confidence<->completely confident  How confident are you that you will not lose your balance or become unsteady when you . . .   Date tested 11/15/2024       10/21/24  Walk around the house 70%   2. Walk up or down stairs 70%   3. Bend over and pick up a slipper from in front of a closet floor 80%   4. Reach for a small can off a shelf at eye level 100%   5. Stand on tip toes and reach for something above your head 40%   6. Stand on a chair and reach for something 0%   7. Sweep the floor 80%   8. Walk outside the house to a car parked in the driveway 80%   9. Get into or out of a car 100%   10. Walk across a parking lot to the mall 80%   11. Walk up or down a ramp 0%   12. Walk in a crowded mall where people rapidly walk past you 80%   13. Are bumped into by people as you walk through the mall 0%   14. Step onto or off of an escalator while you are holding onto the railing 8011%   15. Step onto or off an escalator while holding onto parcels such that you cannot hold onto the railing 0%   16. Walk outside on icy sidewalks 0%   Total: #/16 780/1600 = 48% 880 / 1600 = 55.0 %      TODAY'S TREATMENT:  11/15/24 Bike L2x82min FGA, 5xSTS, tested LE strength,  ABC scale reviewed with patient as she was unable to see the paper  [facility] PT Assessment - 11/15/24 0001       Functional Gait  Assessment   Gait assessed  Yes    Gait Level Surface Walks 20 ft, slow speed, abnormal gait pattern, evidence for imbalance or deviates 10-15 in outside of the 12 in walkway width. Requires more than 7 sec to ambulate 20 ft.    Change in Gait Speed Able to change speed, demonstrates mild gait deviations, deviates 6-10 in outside of the 12 in walkway width, or no  gait deviations, unable to achieve a major change in velocity, or uses a change in velocity, or uses an assistive device.    Gait with Horizontal Head Turns Performs head turns with moderate changes in gait velocity, slows down, deviates 10-15 in outside 12 in walkway width but recovers, can continue to walk.    Gait with  Vertical Head Turns Performs head turns with no change in gait. Deviates no more than 6 in outside 12 in walkway width.    Gait and Pivot Turn Pivot turns safely within 3 sec and stops quickly with no loss of balance.    Step Over Obstacle Is able to step over one shoe box (4.5 in total height) without changing gait speed. No evidence of imbalance.    Gait with Narrow Base of Support Ambulates less than 4 steps heel to toe or cannot perform without assistance.    Gait with Eyes Closed Walks 20 ft, slow speed, abnormal gait pattern, evidence for imbalance, deviates 10-15 in outside 12 in walkway width. Requires more than 9 sec to ambulate 20 ft.    Ambulating Backwards Walks 20 ft, uses assistive device, slower speed, mild gait deviations, deviates 6-10 in outside 12 in walkway width.    Steps Two feet to a stair, must use rail.    Total Score 16          10/28/24 Bike L2x49min Standing gastroc stretch BLE foot on foam roll 2 x 1 min  Step ups and over 4 inch step: fwd BLE x 10 no hands, lateral no hands x 10 SLS color touches with BLE no support therapist beside providing CGA, added cognitive challenges with calling out specific LE to specific dot Sit to stand with min assist x 5  Squats x 10 with UE support Standing hip flex to abd 3lb x 10 BLE   10/25/24 NEUROMUSCULAR RE-EDUCATION: To improve balance, coordination, kinesthesia, and proprioception. Walking backward counting by 2's x 150' Walking backward w/ head turned to the R counting by 2's x 150' Walking forward head turned to the L counting by 2's x 150' Tandem gait forward counting by 2's x 150' Long foam  balance x 1' Long foam sidestepping x 8 laps S/S SLS w/ 1 finger support x 30 sec x 3 each leg Clock cone touch from (9:00 to 3:00) w/ PT calling out a time and patient having to touch appropriate cone x 5' BLE  THERAPEUTIC ACTIVITIES: To improve functional performance.  Demonstration, verbal and tactile cues throughout for technique. Step up and across 4 aerobic step S/S X 10; F/B X 10  Sit to stand x 5   10/21/24 Nustep L5x64min ABC scale- 880 / 1600 = 55.0 %- reviewed with patient as she did not have her glasses  [facility] PT Assessment - 10/21/24 0001       Functional Gait  Assessment   Gait assessed  Yes    Gait Level Surface Walks 20 ft, slow speed, abnormal gait pattern, evidence for imbalance or deviates 10-15 in outside of the 12 in walkway width. Requires more than 7 sec to ambulate 20 ft.    Change in Gait Speed Makes only minor adjustments to walking speed, or accomplishes a change in speed with significant gait deviations, deviates 10-15 in outside the 12 in walkway width, or changes speed but loses balance but is able to recover and continue walking.    Gait with Horizontal Head Turns Performs head turns with moderate changes in gait velocity, slows down, deviates 10-15 in outside 12 in walkway width but recovers, can continue to walk.    Gait with Vertical Head Turns Performs task with slight change in gait velocity (eg, minor disruption to smooth gait path), deviates 6 - 10 in outside 12 in walkway width or uses assistive device    Gait and Pivot Turn Pivot  turns safely within 3 sec and stops quickly with no loss of balance.    Step Over Obstacle Is able to step over one shoe box (4.5 in total height) without changing gait speed. No evidence of imbalance.    Gait with Narrow Base of Support Ambulates less than 4 steps heel to toe or cannot perform without assistance.    Gait with Eyes Closed Walks 20 ft, slow speed, abnormal gait pattern, evidence for imbalance, deviates 10-15 in  outside 12 in walkway width. Requires more than 9 sec to ambulate 20 ft.    Ambulating Backwards Walks 20 ft, slow speed, abnormal gait pattern, evidence for imbalance, deviates 10-15 in outside 12 in walkway width.    Steps Two feet to a stair, must use rail.    Total Score 13          10/14/24 THERAPEUTIC EXERCISE: To improve strength and endurance.  Demonstration, verbal and tactile cues throughout for technique. Bike L3 x 7'  NEUROMUSCULAR RE-EDUCATION: To improve balance, coordination, kinesthesia, posture, and proprioception. Tandem gait F/B 30' x 4 each direction Braiding in front and behind 30' x 4 each way Foam sidestepping x 5 laps  4 step ups  w/ YTB resistance from back x 5, from each side x 5  for each leg w/ 1ue support  THERAPEUTIC ACTIVITIES: To improve functional performance.  Demonstration, verbal and tactile cues throughout for technique. - Up/down 1 flight of steps with R rail reciprocal pattern for ascending, nonreciprocal pattern for descent - seated at edge of table reaching laterally to touch floor x 10 each UE;  much more difficult going to the L - Table to floor transfer--patient preference is scooting to the edge of using both arms behind her on table to lower butt to floor (tricep method) min assist required -  Floor to table transfer with min assist for correct technique of side sitting with knees bent, reaching across body to get both hands on floor, getting to hands and knees, propping arms on table, bringing LLE up for 1/2 kneel and then to partial stand/squat pivot to table (patient would prefer to use back to table method with arms behind her for tricep push up, but this is not ideal for her) - standing toe prop gastroc stretch x 1' x 3 each leg   10/11/24 Bike L2x68min Leg curls 15lb x 20 BLE Leg extension 5lb x 20 BLE Step ups BLE 2 x 10 6 1HA Educated on preventing falls risk and provided handout Tandem gait along counter 4x with support; 4x no  support Braiding backward along counter 2x with support; 2x no support  09/30/24 Bike L2x4min Step ups BLE x 10 6 1HA Lateral step ups LLE x 10 1HA; RLE 2HA x 10 Standing hip abduction 2lb 2 x 10 BLE Standing hip extension 2lb 2 x 10 BLE Standing marching 2lb 2 x 10 BLE Seated LAQ 2lb 2x10 BLE Seated SLR 2lb 2x10 BLE  09/27/24 THERAPEUTIC EXERCISE: To improve strength and endurance.  Demonstration, verbal and tactile cues throughout for technique. NuStep L4 x 6'   NEUROMUSCULAR RE-EDUCATION: To improve balance, coordination, kinesthesia, and posture. Corner balance:  Toe raises x 20  Heel raises x 20  Marching x 20 BLE  EC stance x 1'   Tandem stance x 1' BLE  Step stance EO x 1' each leg;  EC x 1' ea leg  Theraband foam standing heel and toes off the edges EO x 1'; EC x 1' Tandem  gait at counter F/B X 3 LAPS Standing rhythmic stabilization w/ black TB around waist pulling in all directions for challenges Seated on 75 cm swiss ball:  Bouncing EO x 1';  EC x 1;  Rocking F/B x 20;  S/S x 20  Pelvic circles x 20 CW;  x20 CCW  Alternate marching x 10  B shoulder flexion x 10  Alternate LAQ x 10  09/13/24 SELF CARE: Provided education on PT POC progression.and initial HEP   PATIENT EDUCATION:  Education details: HEP review  Person educated: Patient Education method: Explanation, Demonstration, Verbal cues, Tactile cues, and Handouts Education comprehension: verbalized understanding, verbal cues required, tactile cues required, and needs further education  HOME EXERCISE PROGRAM: Access Code: [email or web address] URL: [email or web address] Date: 10/28/2024 Prepared by: Sol Gaskins  Exercises - Tandem Stance in Corner  - 1 x daily - 7 x weekly - 3 sets - 10 reps - Single Leg Stance  - 1 x daily - 7 x weekly - 3 sets - 10 reps - Heel Raises with Counter Support  - 1 x daily - 7 x weekly - 3 sets - 10 reps - Toe Raises with Counter Support  - 1 x daily - 7 x weekly -  3 sets - 10 reps - Gastroc Stretch with Foot at Wall  - 1 x daily - 7 x weekly - 2 sets - 2 reps - 1 min hold  Patient Education - What You Can Do to Prevent Falls   ASSESSMENT:  CLINICAL IMPRESSION: Mrs. Muriel has improved showing better scores on the FGA, 5xSTS, improved strength in BLE. We ran out of time before we were able to assess all the goals. She still demonstrates dynamic balance deficits which prevent her from ambulating community distances normal for her age w/o showing risk for falls. She would continue to benefit from skilled therapy to address these deficits to decrease risk for falls and improve overall function.  EVAL:  Analeigha Nauman is a 84 y.o. female who was referred to physical therapy for evaluation and treatment for gait abnormality.  Patient presents with physical impairments of impaired activity tolerance, impaired standing balance, impaired ambulation, and decreased safety awareness impacting safe and independent functional mobility.  Examination revealed patient is at risk for falls and functional decline as evidenced by the following objective test measures: Gait speed 1.98 ft/sec, (2.62 ft/sec is needed for community access), BERG score of 43/56;  TUG of 11.6 sec (>13.5 sec indicates increased risk for falls), and 5xSTS of 18.96 sec (>15 sec indicates increased risk for falls and decreased BLE power).  ABC scale score of 48% indicates a moderate level of physical functioning.  Breuna will benefit from skilled PT to address above deficits to improve mobility and activity tolerance to help reach the maximal level of functional independence and mobility. Patient demonstrates understanding of this POC and is in agreement with this plan.   OBJECTIVE IMPAIRMENTS: difficulty walking, decreased strength, postural dysfunction, and pain.   ACTIVITY LIMITATIONS: carrying, lifting, bending, squatting, and locomotion level  PARTICIPATION LIMITATIONS: cleaning, laundry,  shopping, and community activity  PERSONAL FACTORS: Age, Fitness, and 1-2 comorbidities: Afib, Diabetes, neuropathy, retinopathy, osteoporosis, h/o falls, h/o L breast CA  are also affecting patient's functional outcome.   REHAB POTENTIAL: Good  CLINICAL DECISION MAKING: Evolving/moderate complexity  EVALUATION COMPLEXITY: Moderate   GOALS: Goals reviewed with patient? Yes  SHORT TERM GOALS: Target date: 12/14/2024   Patient will be independent with advanced/ongoing HEP to facilitate ability  to maintain/progress functional gains from skilled physical therapy services. Baseline: no advanced HEP yet Goal status: IN PROGRESS- 11/16/24- partial compliance with HEP   3.  Patient will be able to step up/down curb safely with LRAD for safety with community ambulation.  Baseline: able to do 1 flight of steps with 1 rail support Goal status: IN PROGRESS- 11/15/24 able to do full flight of stairs with HR, goes down sideways   4.  Patient will demonstrate improved  BLE strength to >/= 5/5 for improved stability and ease of mobility Baseline: Refer to above LE MMT table Goal status: IN PROGRESS- 11/15/24 see chart   LONG TERM GOALS: Target date: 01/11/2025     5.  Patient will improve 5xSTS time to </= 14 seconds for improved efficiency and safety with transfers. Baseline: 18.96 sec Goal status: IN PROGRESS- 11/15/24 20 seconds with UE support   6.  Patient will demonstrate gait speed of >/= 2.62 ft/sec (0.55 m/s) to be a safe limited community ambulator with decreased risk for recurrent falls.  Baseline: 1.98 Goal status: IN PROGRESS- 1.25 m/s 10/21/24  7.  Patient will improve Berg score to >/= 51/56 to improve safety and stability with ADLs in standing and reduce risk for falls. (MCID= 8 points)  Baseline: 43 Goal status: INITIAL  8.  Patient will demonstrate at least 19/24 on DGI to improve gait stability and reduce risk for falls. Baseline: TBD Goal status: INITIAL  9. Patient will  improve FGA score to at least 19/30 to improve gait stability and reduce risk for falls. Baseline: TBD Goal status: IN PROGRESS- 11/15/24- 16/30  10.  Patient will report >/= 70% on ABC scale (MCID = 19%) to demonstrate improved balance confidence with functional mobility and gait. Baseline: 48% Goal status: IN PROGRESS- 10/21/24 880 / 1600 = 55.0 %   PLAN:  PT FREQUENCY: 1-2x/week  PT DURATION: 8 weeks  PLANNED INTERVENTIONS: [id number]- PT Re-evaluation, 97750- Physical Performance Testing, 97110-Therapeutic exercises, 97530- Therapeutic activity, [id number]- Neuromuscular re-education, 97535- Self Care, [id number]- Manual therapy, [phone]- Gait training, Patient/Family education, Balance training, and Stair training  PLAN FOR NEXT SESSION:  assess BERG, gait speed; Add cognitive challenges with gait and tandem walk, braiding, single leg clocks   Date of referral: 09/16/24 Referring provider: Frann Mabel Mt, DO Referring diagnosis? R26.9 (ICD-10-CM) - Gait abnormality Treatment diagnosis? (if different than referring diagnosis)  Difficulty in walking, not elsewhere classified  Muscle weakness (generalized)  Abnormal posture  What was this (referring dx) caused by? Fall and Ongoing Issue  Lysle of Condition: Chronic (continuous duration > 3 months)   Laterality: Both  Current Functional Measure Score: Other ABC scale: 880 / 1600 = 55.0 %  Objective measurements identify impairments when they are compared to normal values, the uninvolved extremity, and prior level of function.  [x]  Yes  []  No  Objective assessment of functional ability: Moderate functional limitations   Briefly describe symptoms: balance deficits when walking increasing risk of falls  How did symptoms start: >1 year ago, but worsened progressively over last few months   Average pain intensity:  Last 24 hours: 0/10  Past week: 4/10  How often does the pt experience symptoms? Constantly  How much have the  symptoms interfered with usual daily activities? A little bit  How has condition changed since care began at this facility? Better  In general, how is the patients overall health? Good  Onset date: over 1 year ago   BACK PAIN (STarT Back Screening Tool) - (  When applicable):                  N/A  Has your back pain spread down your leg(s) at sometime in the last 2 weeks? []  Yes   []  No Have you had pain in the shoulder or neck at sometime in the past 2 weeks? []  Yes   []  No Have you only walked short distances because of your back pain? []  Yes   []  No In the past 2 weeks, have you dressed more slowly than usual because of your back pain? []  Yes   []  No Do you think it is not really safe for person with a condition like yours to be physically active? []  Yes   []  No Have worrying thoughts been going through your mind a lot of the time? []  Yes   []  No Do you feel that your back pain is terrible and it is never going to get any better? []  Yes   []  No In general, have you stopped enjoying all the things you usually enjoy? []  Yes   []  No Overall, how bothersome has your back pain been in the last 2 weeks? []  Not at all   []  Slightly     []  Moderate   []  Very much     []  Extremely      Zareen Jamison LITTIE Gaskins, PTA 11/15/2024, 5:39 PM  I was present in the clinic and supervised the treatment of this patient.     Chasity has been seen for 9 PT visits over the last 2 months for unsteady gait/falls.    She is making progress to goals, but has not yet attained the goals that we have set for her.   However, she has good rehab potential to meet her goals.    Her FGA standardized balance assessment has improved from 13 to 16/30.   However her score still indicates that she is a high fall risk.   Her hip and ankle strength have improved by 1/2 to 1 full manual muscle test grade.   ABC scale for subjective balance confidence has improved from 48% to 55%.   Her gait speed, 5X sit to stand scores have been  slower to improve, but we expect that they will improve since we have only had 9 visits.   The holiday break has also slowed us  down somewhat.    She is an excellent candidate for further PT and PT remains necessary for balance, gait, strength, safety, HEP deficits.   Continue per POC.    Recertification orders will be sent to MD for signature to continue  Garnette Montclair, PT 11/16/2024, 10:11 AM  [facility] [address]  [address] [place], [place], [address] Phone: [phone]   Fax:  [phone]    "

## 2024-11-16 NOTE — Addendum Note (Signed)
 Addended by: Rebeccah Ivins G on: 11/16/2024 10:14 AM   Modules accepted: Orders

## 2024-11-19 ENCOUNTER — Ambulatory Visit: Admitting: Rehabilitation

## 2024-11-19 ENCOUNTER — Encounter: Payer: Self-pay | Admitting: Rehabilitation

## 2024-11-19 DIAGNOSIS — R293 Abnormal posture: Secondary | ICD-10-CM

## 2024-11-19 DIAGNOSIS — R262 Difficulty in walking, not elsewhere classified: Secondary | ICD-10-CM

## 2024-11-19 DIAGNOSIS — M6281 Muscle weakness (generalized): Secondary | ICD-10-CM

## 2024-11-19 NOTE — Therapy (Signed)
 " OUTPATIENT PHYSICAL THERAPY NEURO TREATMENT / PROGRESS NOTE    Progress Note Reporting Period 09/13/25 to 11/19/2024  See note below for Objective Data and Assessment of Progress/Goals.      Patient Name: Vickie Burns MRN: [id number] DOB:10-26-1941, 84 y.o., female Today's Date: 11/19/2024   END OF SESSION:  PT End of Session - 11/19/24 1032     Visit Number 10    Date for Recertification  11/08/24    PT Start Time 1023    PT Stop Time 1104    PT Time Calculation (min) 41 min    Activity Tolerance Patient tolerated treatment well;No increased pain    Behavior During Therapy [facility] for tasks assessed/performed                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   Essential hypertension 10/07/2016   Fall at home, initial encounter 04/05/2022   Family history of breast cancer    aunt   History of adenomatous polyp of colon 02/12/2021   HLD (hyperlipidemia) 08/17/2012   Hypercholesteremia 2000   Hypercholesterolemia 2000   Hypertension    history   Long term (current) use of anticoagulants 12/14/2021   Long term (current) use of insulin  (HCC) 02/12/2021   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   New onset a-fib (HCC) 06/28/2020   Osteoporosis 09/06/2016   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   Persistent atrial fibrillation (HCC) 06/07/2021   Personal history of malignant neoplasm of breast 02/12/2021   Personal history of  radiation therapy    Postmenopausal    took HRT 1999 - 2004   Preop cardiovascular exam 07/03/2020   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   Traumatic ecchymosis of right lower leg 04/05/2022   Unspecified atrial fibrillation (HCC) 07/03/2020   Past Surgical History:  Procedure Laterality Date   BREAST BIOPSY     BREAST BIOPSY Left 10/29/2022   MM LT BREAST BX W LOC DEV 1ST LESION IMAGE BX SPEC STEREO GUIDE 10/29/2022 [facility]   BREAST EXCISIONAL BIOPSY Left 09/07/2020   BREAST LUMPECTOMY  04/2009   rt breast  estrogen +, Her 2 Nu negative   BREAST LUMPECTOMY WITH RADIOACTIVE SEED LOCALIZATION Left 09/07/2020   Procedure: RADIOCATIVE SEED GUIDED LEFT BREAST LUMPECTOMY;  Surgeon: Ethyl Lenis, MD;  Location: [facility];  Service: General;  Laterality: Left;   COLONOSCOPY WITH PROPOFOL  N/A 04/05/2014   Procedure: COLONOSCOPY WITH PROPOFOL ;  Surgeon: Gladis MARLA Louder, MD;  Location: [facility];  Service: Endoscopy;  Laterality: N/A;   ORIF FOOT FRACTURE Right 04/20/2014   done in [facility] TOOTH EXTRACTION     Patient Active Problem List   Diagnosis Date Noted   Nuclear sclerotic cataract of left eye 02/26/2024   Osteopenia of left forearm 12/09/2023   Well woman exam with routine gynecological exam 12/04/2023   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   Acute blood loss anemia 04/05/2022   Traumatic ecchymosis of right lower leg 04/05/2022   Fall at home,  initial encounter 04/05/2022   Long term (current) use of anticoagulants 12/14/2021   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   Coronary arteriosclerosis 09/25/2021   Persistent atrial fibrillation (HCC) 06/07/2021   Diabetic peripheral neuropathy associated with type 2 diabetes mellitus (HCC) 02/12/2021   Diabetic retinopathy associated with type 2 diabetes mellitus (HCC) 02/12/2021   History of adenomatous polyp of colon 02/12/2021   Long term (current) use of insulin  (HCC) 02/12/2021   Peripheral venous  insufficiency 02/12/2021   Personal history of malignant neoplasm of breast 02/12/2021   Atrial fibrillation (HCC) 02/12/2021   Family history of breast cancer    Hypertension    Personal history of radiation therapy    Postmenopausal    Diabetes mellitus due to underlying condition with unspecified complications (HCC) 07/03/2020   Unspecified atrial fibrillation (HCC) 07/03/2020   New onset a-fib (HCC) 06/28/2020   Essential hypertension 10/07/2016   Osteoporosis 09/06/2016   Macular degeneration 2016   HLD (hyperlipidemia) 08/17/2012   Diabetes mellitus, type 2 (HCC) 01/27/2012   Breast CA (HCC) 12/20/2011   Hypercholesterolemia 2000   Hypercholesteremia 2000    PCP: Frann Mabel Mt, DO   REFERRING PROVIDER: Frann Mabel Mt, DO   REFERRING DIAG: R26.9 (ICD-10-CM) - Gait abnormality  THERAPY DIAG:  Difficulty in walking, not elsewhere classified  Muscle weakness (generalized)  Abnormal posture  RATIONALE FOR EVALUATION AND TREATMENT: Rehabilitation  ONSET DATE: >1 year, but worsening progressively over last few months  NEXT MD VISIT:    SUBJECTIVE:                                                                                                                                                                                                         SUBJECTIVE STATEMENT: Patient reports doing ok.  Had a shot in her L eye earlier this week and has some redness and visual deficits from it.     84 y/o female referred to PT from PCP for gait abnormality.   She is also having some memory deficits and brain MRI is pending.  She is also supposed to see a neurologist  Pt accompanied by: self  PAIN: Are you having pain? No  PERTINENT HISTORY:  Afib, Diabetes, neuropathy, retinopathy, osteoporosis, h/o falls, h/o L breast CA   PRECAUTIONS: Other: L breast surgery/lumpectomy  RED FLAGS: None  WEIGHT BEARING RESTRICTIONS: No  FALLS:  Has patient fallen  in last 6 months? Yes. Number of falls several  LIVING ENVIRONMENT: Lives with: lives alone Lives in: House/apartment Stairs: Yes: Internal: 14 steps; on right going up and External:  6 steps; bilateral but cannot reach both Has following equipment at home: Single point cane, Walker - 2 wheeled, bed side commode, and Grab bars  OCCUPATION: works for [employer]  PLOF: Independent with gait  PATIENT GOALS: have better balance   OBJECTIVE: (objective measures completed at initial evaluation unless otherwise dated)  DIAGNOSTIC FINDINGS:  Brain MRI is pending  COGNITION: Overall cognitive status: Within functional limits for tasks assessed   SENSATION: WFL  COORDINATION: WNL  EDEMA:  Mild BLE  MUSCLE TONE: WNL  DTRs:  NT  POSTURE:  rounded shoulders and forward head     LOWER EXTREMITY ROM:    MMT Right eval Left eval  Hip flexion    Hip extension    Hip abduction    Hip adduction    Hip internal rotation    Hip external rotation    Knee flexion    Knee extension    Ankle dorsiflexion    Ankle plantarflexion    Ankle inversion    Ankle eversion    (Blank rows = not tested) LOWER EXTREMITY MMT:     Active  Right eval Left eval R 11/15/24 L 11/15/24  Hip flexion 4 4 4+ 4+  Hip extension      Hip abduction 4 4 5- sitting 5 sitting  Hip adduction      Hip internal rotation 4+ 4    Hip external rotation 4 4+    Knee flexion 5 5 4+ 4+  Knee extension 5 5 4+ 4+  Ankle dorsiflexion 4 4- 4+ 4  Ankle plantarflexion      Ankle inversion      Ankle eversion       (Blank rows = not tested) BED MOBILITY:  Rolling to Right CGA Rolling to Left Complete Independence  TRANSFERS: Assistive device utilized: None  Sit to stand: Modified independence Stand to sit: Modified independence Chair to chair: Modified independence Floor: NT  GAIT: Distance walked:  clinic distances and into clinic from parking lot Assistive device utilized: None Level of  assistance: SBA Gait pattern: decreased step length- Right, decreased step length- Left, decreased stance time- Right, decreased stance time- Left, decreased ankle dorsiflexion- Right, and decreased ankle dorsiflexion- Left Comments:   FUNCTIONAL TESTS:  PHYSICAL PERFORMANCE TEST or MEASUREMENT:   [facility] PT Assessment - 10/21/24 0001       Functional Gait  Assessment   Gait assessed  Yes    Gait Level Surface Walks 20 ft, slow speed, abnormal gait pattern, evidence for imbalance or deviates 10-15 in outside of the 12 in walkway width. Requires more than 7 sec to ambulate 20 ft.    Change in Gait Speed Makes only minor adjustments to walking speed, or accomplishes a change in speed with significant gait deviations, deviates 10-15 in outside the 12 in walkway width, or changes speed but loses balance but is able to recover and continue walking.    Gait with Horizontal Head Turns Performs head turns with moderate changes in gait velocity, slows down, deviates 10-15 in outside 12 in walkway width but recovers, can continue to walk.    Gait with Vertical Head Turns Performs task with slight change in gait velocity (eg, minor disruption to smooth gait path), deviates 6 - 10 in outside 12 in walkway width or uses assistive device    Gait and Pivot Turn Pivot turns safely within 3 sec and stops quickly with no loss of balance.    Step Over Obstacle Is able  to step over one shoe box (4.5 in total height) without changing gait speed. No evidence of imbalance.    Gait with Narrow Base of Support Ambulates less than 4 steps heel to toe or cannot perform without assistance.    Gait with Eyes Closed Walks 20 ft, slow speed, abnormal gait pattern, evidence for imbalance, deviates 10-15 in outside 12 in walkway width. Requires more than 9 sec to ambulate 20 ft.    Ambulating Backwards Walks 20 ft, slow speed, abnormal gait pattern, evidence for imbalance, deviates 10-15 in outside 12 in walkway width.    Steps  Two feet to a stair, must use rail.    Total Score 13           Gait speed:  1.98 ft/sec 5x STS = 18.96 sec TUG score = 11.6 sec   PATIENT SURVEYS:  ABC scale: The Activities-Specific Balance Confidence (ABC) Scale 0% 10 20 30  40 50 60 70 80 90 100% No confidence<->completely confident  How confident are you that you will not lose your balance or become unsteady when you . . .   Date tested 11/19/2024       10/21/24  Walk around the house 70%   2. Walk up or down stairs 70%   3. Bend over and pick up a slipper from in front of a closet floor 80%   4. Reach for a small can off a shelf at eye level 100%   5. Stand on tip toes and reach for something above your head 40%   6. Stand on a chair and reach for something 0%   7. Sweep the floor 80%   8. Walk outside the house to a car parked in the driveway 80%   9. Get into or out of a car 100%   10. Walk across a parking lot to the mall 80%   11. Walk up or down a ramp 0%   12. Walk in a crowded mall where people rapidly walk past you 80%   13. Are bumped into by people as you walk through the mall 0%   14. Step onto or off of an escalator while you are holding onto the railing 8011%   15. Step onto or off an escalator while holding onto parcels such that you cannot hold onto the railing 0%   16. Walk outside on icy sidewalks 0%   Total: #/16 780/1600 = 48% 880 / 1600 = 55.0 %      TODAY'S TREATMENT:  11/19/24 THERAPEUTIC EXERCISE: To improve strength and endurance.  Demonstration, verbal and tactile cues throughout for technique. Bike L3 x 7'  NEUROMUSCULAR RE-EDUCATION: To improve proprioception and balance. [place] in front and behind x 40' x 4 Star/dot touch from 12-6:00 x 1 cycle each foot Cone touch from 9-3:00 calling out random colors x 4' Foam airex:  Weighted ball (yellow) basket toss forward x 1'  Weighted ball toss laterally x 1' R;  x 1' L  THERAPEUTIC ACTIVITIES: To improve functional performance.   Demonstration, verbal and tactile cues throughout for technique. Kick ball alternating feet with no UE support x 3' 6 step up and over and back x 5 F/B;   X 5 S/S   11/15/24 Bike L2x51min FGA, 5xSTS, tested LE strength,  ABC scale reviewed with patient as she was unable to see the paper  [facility] PT Assessment - 11/15/24 0001       Functional Gait  Assessment   Gait assessed  Yes    Gait Level Surface Walks 20 ft, slow speed, abnormal gait pattern, evidence for imbalance or deviates 10-15 in outside of the 12 in walkway width. Requires more than 7 sec to ambulate 20 ft.    Change in Gait Speed Able to change speed, demonstrates mild gait deviations, deviates 6-10 in outside of the 12 in walkway width, or no gait deviations, unable to achieve a major change in velocity, or uses a change in velocity, or uses an assistive device.    Gait with Horizontal Head Turns Performs head turns with moderate changes in gait velocity, slows down, deviates 10-15 in outside 12 in walkway width but recovers, can continue to walk.    Gait with Vertical Head Turns Performs head turns with no change in gait. Deviates no more than 6 in outside 12 in walkway width.    Gait and Pivot Turn Pivot turns safely within 3 sec and stops quickly with no loss of balance.    Step Over Obstacle Is able to step over one shoe box (4.5 in total height) without changing gait speed. No evidence of imbalance.    Gait with Narrow Base of Support Ambulates less than 4 steps heel to toe or cannot perform without assistance.    Gait with Eyes Closed Walks 20 ft, slow speed, abnormal gait pattern, evidence for imbalance, deviates 10-15 in outside 12 in walkway width. Requires more than 9 sec to ambulate 20 ft.    Ambulating Backwards Walks 20 ft, uses assistive device, slower speed, mild gait deviations, deviates 6-10 in outside 12 in walkway width.    Steps Two feet to a stair, must use rail.    Total Score 16          10/28/24 Bike  L2x18min Standing gastroc stretch BLE foot on foam roll 2 x 1 min  Step ups and over 4 inch step: fwd BLE x 10 no hands, lateral no hands x 10 SLS color touches with BLE no support therapist beside providing CGA, added cognitive challenges with calling out specific LE to specific dot Sit to stand with min assist x 5  Squats x 10 with UE support Standing hip flex to abd 3lb x 10 BLE   10/25/24 NEUROMUSCULAR RE-EDUCATION: To improve balance, coordination, kinesthesia, and proprioception. Walking backward counting by 2's x 150' Walking backward w/ head turned to the R counting by 2's x 150' Walking forward head turned to the L counting by 2's x 150' Tandem gait forward counting by 2's x 150' Long foam balance x 1' Long foam sidestepping x 8 laps S/S SLS w/ 1 finger support x 30 sec x 3 each leg Clock cone touch from (9:00 to 3:00) w/ PT calling out a time and patient having to touch appropriate cone x 5' BLE  THERAPEUTIC ACTIVITIES: To improve functional performance.  Demonstration, verbal and tactile cues throughout for technique. Step up and across 4 aerobic step S/S X 10; F/B X 10  Sit to stand x 5   10/21/24 Nustep L5x47min ABC scale- 880 / 1600 = 55.0 %- reviewed with patient as she did not have her glasses  [facility] PT Assessment - 10/21/24 0001       Functional Gait  Assessment   Gait assessed  Yes    Gait Level Surface Walks 20 ft, slow speed, abnormal gait pattern, evidence for imbalance or deviates 10-15 in outside of the 12 in walkway width. Requires more than 7 sec to ambulate 20 ft.  Change in Gait Speed Makes only minor adjustments to walking speed, or accomplishes a change in speed with significant gait deviations, deviates 10-15 in outside the 12 in walkway width, or changes speed but loses balance but is able to recover and continue walking.    Gait with Horizontal Head Turns Performs head turns with moderate changes in gait velocity, slows down, deviates 10-15 in  outside 12 in walkway width but recovers, can continue to walk.    Gait with Vertical Head Turns Performs task with slight change in gait velocity (eg, minor disruption to smooth gait path), deviates 6 - 10 in outside 12 in walkway width or uses assistive device    Gait and Pivot Turn Pivot turns safely within 3 sec and stops quickly with no loss of balance.    Step Over Obstacle Is able to step over one shoe box (4.5 in total height) without changing gait speed. No evidence of imbalance.    Gait with Narrow Base of Support Ambulates less than 4 steps heel to toe or cannot perform without assistance.    Gait with Eyes Closed Walks 20 ft, slow speed, abnormal gait pattern, evidence for imbalance, deviates 10-15 in outside 12 in walkway width. Requires more than 9 sec to ambulate 20 ft.    Ambulating Backwards Walks 20 ft, slow speed, abnormal gait pattern, evidence for imbalance, deviates 10-15 in outside 12 in walkway width.    Steps Two feet to a stair, must use rail.    Total Score 13          10/14/24 THERAPEUTIC EXERCISE: To improve strength and endurance.  Demonstration, verbal and tactile cues throughout for technique. Bike L3 x 7'  NEUROMUSCULAR RE-EDUCATION: To improve balance, coordination, kinesthesia, posture, and proprioception. Tandem gait F/B 30' x 4 each direction Braiding in front and behind 30' x 4 each way Foam sidestepping x 5 laps  4 step ups  w/ YTB resistance from back x 5, from each side x 5  for each leg w/ 1ue support  THERAPEUTIC ACTIVITIES: To improve functional performance.  Demonstration, verbal and tactile cues throughout for technique. - Up/down 1 flight of steps with R rail reciprocal pattern for ascending, nonreciprocal pattern for descent - seated at edge of table reaching laterally to touch floor x 10 each UE;  much more difficult going to the L - Table to floor transfer--patient preference is scooting to the edge of using both arms behind her on table to  lower butt to floor (tricep method) min assist required -  Floor to table transfer with min assist for correct technique of side sitting with knees bent, reaching across body to get both hands on floor, getting to hands and knees, propping arms on table, bringing LLE up for 1/2 kneel and then to partial stand/squat pivot to table (patient would prefer to use back to table method with arms behind her for tricep push up, but this is not ideal for her) - standing toe prop gastroc stretch x 1' x 3 each leg   10/11/24 Bike L2x80min Leg curls 15lb x 20 BLE Leg extension 5lb x 20 BLE Step ups BLE 2 x 10 6 1HA Educated on preventing falls risk and provided handout Tandem gait along counter 4x with support; 4x no support Braiding backward along counter 2x with support; 2x no support  09/30/24 Bike L2x44min Step ups BLE x 10 6 1HA Lateral step ups LLE x 10 1HA; RLE 2HA x 10 Standing hip abduction 2lb  2 x 10 BLE Standing hip extension 2lb 2 x 10 BLE Standing marching 2lb 2 x 10 BLE Seated LAQ 2lb 2x10 BLE Seated SLR 2lb 2x10 BLE  09/27/24 THERAPEUTIC EXERCISE: To improve strength and endurance.  Demonstration, verbal and tactile cues throughout for technique. NuStep L4 x 6'   NEUROMUSCULAR RE-EDUCATION: To improve balance, coordination, kinesthesia, and posture. Corner balance:  Toe raises x 20  Heel raises x 20  Marching x 20 BLE  EC stance x 1'   Tandem stance x 1' BLE  Step stance EO x 1' each leg;  EC x 1' ea leg  Theraband foam standing heel and toes off the edges EO x 1'; EC x 1' Tandem gait at counter F/B X 3 LAPS Standing rhythmic stabilization w/ black TB around waist pulling in all directions for challenges Seated on 75 cm swiss ball:  Bouncing EO x 1';  EC x 1;  Rocking F/B x 20;  S/S x 20  Pelvic circles x 20 CW;  x20 CCW  Alternate marching x 10  B shoulder flexion x 10  Alternate LAQ x 10  09/13/24 SELF CARE: Provided education on PT POC progression.and initial  HEP   PATIENT EDUCATION:  Education details: HEP review  Person educated: Patient Education method: Explanation, Demonstration, Verbal cues, Tactile cues, and Handouts Education comprehension: verbalized understanding, verbal cues required, tactile cues required, and needs further education  HOME EXERCISE PROGRAM: Access Code: [email or web address] URL: [email or web address] Date: 10/28/2024 Prepared by: Sol Gaskins  Exercises - Tandem Stance in Corner  - 1 x daily - 7 x weekly - 3 sets - 10 reps - Single Leg Stance  - 1 x daily - 7 x weekly - 3 sets - 10 reps - Heel Raises with Counter Support  - 1 x daily - 7 x weekly - 3 sets - 10 reps - Toe Raises with Counter Support  - 1 x daily - 7 x weekly - 3 sets - 10 reps - Gastroc Stretch with Foot at Wall  - 1 x daily - 7 x weekly - 2 sets - 2 reps - 1 min hold  Patient Education - What You Can Do to Prevent Falls   ASSESSMENT:  CLINICAL IMPRESSION: 10th visit progress note:  The below recertification note will serve as this patient's progress note since it was completed on the last visit.   She continues to make progress and is able to advance to more difficult balance activities today.   Backward step up and over the aerobic step are especially difficult for her to complete and assist is needed throughout to maintain balance.   Foam standing is also difficult indicating that she would have problems on unlevel ground.   PT remains necessary for strength, balance, gait, safety deficits.   Continue per POC   RECERTIFICATION NOTE 11/15/24:  Vickie Burns has been seen for 9 PT visits over the last 2 months for unsteady gait/falls.    She is making progress to goals, but has not yet attained the goals that we have set for her.   However, she has good rehab potential to meet her goals.    Her FGA standardized balance assessment has improved from 13 to 16/30.   However her score still indicates that she is a high fall risk.   Her hip and ankle  strength have improved by 1/2 to 1 full manual muscle test grade.   ABC scale for subjective balance confidence has improved from 48% to 55%.  Her gait speed, 5X sit to stand scores have been slower to improve, but we expect that they will improve since we have only had 9 visits.   The holiday break has also slowed us  down somewhat.    She is an excellent candidate for further PT and PT remains necessary for balance, gait, strength, safety, HEP deficits.   Continue per POC.    Recertification orders will be sent to MD for signature to continue  EVAL:  Vickie Burns is a 84 y.o. female who was referred to physical therapy for evaluation and treatment for gait abnormality.  Patient presents with physical impairments of impaired activity tolerance, impaired standing balance, impaired ambulation, and decreased safety awareness impacting safe and independent functional mobility.  Examination revealed patient is at risk for falls and functional decline as evidenced by the following objective test measures: Gait speed 1.98 ft/sec, (2.62 ft/sec is needed for community access), BERG score of 43/56;  TUG of 11.6 sec (>13.5 sec indicates increased risk for falls), and 5xSTS of 18.96 sec (>15 sec indicates increased risk for falls and decreased BLE power).  ABC scale score of 48% indicates a moderate level of physical functioning.  Vickie Burns will benefit from skilled PT to address above deficits to improve mobility and activity tolerance to help reach the maximal level of functional independence and mobility. Patient demonstrates understanding of this POC and is in agreement with this plan.   OBJECTIVE IMPAIRMENTS: difficulty walking, decreased strength, postural dysfunction, and pain.   ACTIVITY LIMITATIONS: carrying, lifting, bending, squatting, and locomotion level  PARTICIPATION LIMITATIONS: cleaning, laundry, shopping, and community activity  PERSONAL FACTORS: Age, Fitness, and 1-2 comorbidities: Afib,  Diabetes, neuropathy, retinopathy, osteoporosis, h/o falls, h/o L breast CA  are also affecting patient's functional outcome.   REHAB POTENTIAL: Good  CLINICAL DECISION MAKING: Evolving/moderate complexity  EVALUATION COMPLEXITY: Moderate   GOALS: Goals reviewed with patient? Yes  SHORT TERM GOALS: Target date: 12/14/2024   Patient will be independent with advanced/ongoing HEP to facilitate ability to maintain/progress functional gains from skilled physical therapy services. Baseline: no advanced HEP yet Goal status: IN PROGRESS- 11/16/24- partial compliance with HEP   3.  Patient will be able to step up/down curb safely with LRAD for safety with community ambulation.  Baseline: able to do 1 flight of steps with 1 rail support Goal status: IN PROGRESS- 11/15/24 able to do full flight of stairs with HR, goes down sideways   4.  Patient will demonstrate improved  BLE strength to >/= 5/5 for improved stability and ease of mobility Baseline: Refer to above LE MMT table Goal status: IN PROGRESS- 11/15/24 see chart   LONG TERM GOALS: Target date: 01/11/2025     5.  Patient will improve 5xSTS time to </= 14 seconds for improved efficiency and safety with transfers. Baseline: 18.96 sec Goal status: IN PROGRESS- 11/15/24 20 seconds with UE support   6.  Patient will demonstrate gait speed of >/= 2.62 ft/sec (0.55 m/s) to be a safe limited community ambulator with decreased risk for recurrent falls.  Baseline: 1.98 Goal status: IN PROGRESS- 1.25 m/s 10/21/24  7.  Patient will improve Berg score to >/= 51/56 to improve safety and stability with ADLs in standing and reduce risk for falls. (MCID= 8 points)  Baseline: 43 Goal status: INITIAL  8.  Patient will demonstrate at least 19/24 on DGI to improve gait stability and reduce risk for falls. Baseline: TBD Goal status: INITIAL  9. Patient will improve FGA  score to at least 19/30 to improve gait stability and reduce risk for  falls. Baseline: TBD Goal status: IN PROGRESS- 11/15/24- 16/30  10.  Patient will report >/= 70% on ABC scale (MCID = 19%) to demonstrate improved balance confidence with functional mobility and gait. Baseline: 48% Goal status: IN PROGRESS- 10/21/24 880 / 1600 = 55.0 %   PLAN:  PT FREQUENCY: 1-2x/week  PT DURATION: 8 weeks  PLANNED INTERVENTIONS: 97164- PT Re-evaluation, 97750- Physical Performance Testing, 97110-Therapeutic exercises, 97530- Therapeutic activity, [id number]- Neuromuscular re-education, 97535- Self Care, [id number]- Manual therapy, [phone]- Gait training, Patient/Family education, Balance training, and Stair training  PLAN FOR NEXT SESSION:  assess BERG, gait speed; Add cognitive challenges with gait and tandem walk,   Garnette Montclair, PT 11/19/2024, 1:00 PM   "

## 2024-11-22 ENCOUNTER — Ambulatory Visit

## 2024-11-22 DIAGNOSIS — R262 Difficulty in walking, not elsewhere classified: Secondary | ICD-10-CM

## 2024-11-22 DIAGNOSIS — R293 Abnormal posture: Secondary | ICD-10-CM

## 2024-11-22 DIAGNOSIS — M6281 Muscle weakness (generalized): Secondary | ICD-10-CM

## 2024-11-22 NOTE — Therapy (Signed)
 " OUTPATIENT PHYSICAL THERAPY NEURO TREATMENT / PROGRESS NOTE    Progress Note Reporting Period 09/13/25 to 11/22/2024  See note below for Objective Data and Assessment of Progress/Goals.      Patient Name: Vickie Burns MRN: [id number] DOB:1941-06-11, 84 y.o., female Today's Date: 11/22/2024   END OF SESSION:  PT End of Session - 11/22/24 1146     Visit Number 11    Date for Recertification  11/08/24    PT Start Time 1103    PT Stop Time 1145    PT Time Calculation (min) 42 min    Activity Tolerance Patient tolerated treatment well;No increased pain    Behavior During Therapy [facility] for tasks assessed/performed                 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   Essential hypertension 10/07/2016   Fall at home, initial encounter 04/05/2022   Family history of breast cancer    aunt   History of adenomatous polyp of colon 02/12/2021   HLD (hyperlipidemia) 08/17/2012   Hypercholesteremia 2000   Hypercholesterolemia 2000   Hypertension    history   Long term (current) use of anticoagulants 12/14/2021   Long term (current) use of insulin  (HCC) 02/12/2021   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   New onset a-fib (HCC) 06/28/2020   Osteoporosis 09/06/2016   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   Persistent atrial fibrillation (HCC) 06/07/2021   Personal history of malignant neoplasm of breast 02/12/2021   Personal history  of radiation therapy    Postmenopausal    took HRT 1999 - 2004   Preop cardiovascular exam 07/03/2020   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   Traumatic ecchymosis of right lower leg 04/05/2022   Unspecified atrial fibrillation (HCC) 07/03/2020   Past Surgical History:  Procedure Laterality Date   BREAST BIOPSY     BREAST BIOPSY Left 10/29/2022   MM LT BREAST BX W LOC DEV 1ST LESION IMAGE BX SPEC STEREO GUIDE 10/29/2022 [facility]   BREAST EXCISIONAL BIOPSY Left 09/07/2020   BREAST LUMPECTOMY  04/2009   rt breast  estrogen +, Her 2 Nu negative   BREAST LUMPECTOMY WITH RADIOACTIVE SEED LOCALIZATION Left 09/07/2020   Procedure: RADIOCATIVE SEED GUIDED LEFT BREAST LUMPECTOMY;  Surgeon: Ethyl Lenis, MD;  Location: [facility];  Service: General;  Laterality: Left;   COLONOSCOPY WITH PROPOFOL  N/A 04/05/2014   Procedure: COLONOSCOPY WITH PROPOFOL ;  Surgeon: Gladis MARLA Louder, MD;  Location: [facility];  Service: Endoscopy;  Laterality: N/A;   ORIF FOOT FRACTURE Right 04/20/2014   done in [facility] TOOTH EXTRACTION     Patient Active Problem List   Diagnosis Date Noted   Nuclear sclerotic cataract of left eye 02/26/2024   Osteopenia of left forearm 12/09/2023   Well woman exam with routine gynecological exam 12/04/2023   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   Acute blood loss anemia 04/05/2022   Traumatic ecchymosis of right lower leg 04/05/2022   Fall at  home, initial encounter 04/05/2022   Long term (current) use of anticoagulants 12/14/2021   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   Coronary arteriosclerosis 09/25/2021   Persistent atrial fibrillation (HCC) 06/07/2021   Diabetic peripheral neuropathy associated with type 2 diabetes mellitus (HCC) 02/12/2021   Diabetic retinopathy associated with type 2 diabetes mellitus (HCC) 02/12/2021   History of adenomatous polyp of colon 02/12/2021   Long term (current) use of insulin  (HCC) 02/12/2021   Peripheral venous  insufficiency 02/12/2021   Personal history of malignant neoplasm of breast 02/12/2021   Atrial fibrillation (HCC) 02/12/2021   Family history of breast cancer    Hypertension    Personal history of radiation therapy    Postmenopausal    Diabetes mellitus due to underlying condition with unspecified complications (HCC) 07/03/2020   Unspecified atrial fibrillation (HCC) 07/03/2020   New onset a-fib (HCC) 06/28/2020   Essential hypertension 10/07/2016   Osteoporosis 09/06/2016   Macular degeneration 2016   HLD (hyperlipidemia) 08/17/2012   Diabetes mellitus, type 2 (HCC) 01/27/2012   Breast CA (HCC) 12/20/2011   Hypercholesterolemia 2000   Hypercholesteremia 2000    PCP: Frann Mabel Mt, DO   REFERRING PROVIDER: Frann Mabel Mt, DO   REFERRING DIAG: R26.9 (ICD-10-CM) - Gait abnormality  THERAPY DIAG:  Difficulty in walking, not elsewhere classified  Muscle weakness (generalized)  Abnormal posture  RATIONALE FOR EVALUATION AND TREATMENT: Rehabilitation  ONSET DATE: >1 year, but worsening progressively over last few months  NEXT MD VISIT:    SUBJECTIVE:                                                                                                                                                                                                         SUBJECTIVE STATEMENT: Patient reports doing ok.  Had a shot in her L eye earlier this week and has some redness and visual deficits from it.     84 y/o female referred to PT from PCP for gait abnormality.   She is also having some memory deficits and brain MRI is pending.  She is also supposed to see a neurologist  Pt accompanied by: self  PAIN: Are you having pain? No  PERTINENT HISTORY:  Afib, Diabetes, neuropathy, retinopathy, osteoporosis, h/o falls, h/o L breast CA   PRECAUTIONS: Other: L breast surgery/lumpectomy  RED FLAGS: None  WEIGHT BEARING RESTRICTIONS: No  FALLS:  Has patient fallen  in last 6 months? Yes. Number of falls several  LIVING ENVIRONMENT: Lives with: lives alone Lives in: House/apartment Stairs: Yes: Internal: 14 steps; on right going up and  External: 6 steps; bilateral but cannot reach both Has following equipment at home: Single point cane, Walker - 2 wheeled, bed side commode, and Grab bars  OCCUPATION: works for [employer]  PLOF: Independent with gait  PATIENT GOALS: have better balance   OBJECTIVE: (objective measures completed at initial evaluation unless otherwise dated)  DIAGNOSTIC FINDINGS:  Brain MRI is pending  COGNITION: Overall cognitive status: Within functional limits for tasks assessed   SENSATION: WFL  COORDINATION: WNL  EDEMA:  Mild BLE  MUSCLE TONE: WNL  DTRs:  NT  POSTURE:  rounded shoulders and forward head     LOWER EXTREMITY ROM:    MMT Right eval Left eval  Hip flexion    Hip extension    Hip abduction    Hip adduction    Hip internal rotation    Hip external rotation    Knee flexion    Knee extension    Ankle dorsiflexion    Ankle plantarflexion    Ankle inversion    Ankle eversion    (Blank rows = not tested) LOWER EXTREMITY MMT:     Active  Right eval Left eval R 11/15/24 L 11/15/24  Hip flexion 4 4 4+ 4+  Hip extension      Hip abduction 4 4 5- sitting 5 sitting  Hip adduction      Hip internal rotation 4+ 4    Hip external rotation 4 4+    Knee flexion 5 5 4+ 4+  Knee extension 5 5 4+ 4+  Ankle dorsiflexion 4 4- 4+ 4  Ankle plantarflexion      Ankle inversion      Ankle eversion       (Blank rows = not tested) BED MOBILITY:  Rolling to Right CGA Rolling to Left Complete Independence  TRANSFERS: Assistive device utilized: None  Sit to stand: Modified independence Stand to sit: Modified independence Chair to chair: Modified independence Floor: NT  GAIT: Distance walked:  clinic distances and into clinic from parking lot Assistive device utilized: None Level of  assistance: SBA Gait pattern: decreased step length- Right, decreased step length- Left, decreased stance time- Right, decreased stance time- Left, decreased ankle dorsiflexion- Right, and decreased ankle dorsiflexion- Left Comments:   FUNCTIONAL TESTS:  PHYSICAL PERFORMANCE TEST or MEASUREMENT:   [facility] PT Assessment - 10/21/24 0001       Functional Gait  Assessment   Gait assessed  Yes    Gait Level Surface Walks 20 ft, slow speed, abnormal gait pattern, evidence for imbalance or deviates 10-15 in outside of the 12 in walkway width. Requires more than 7 sec to ambulate 20 ft.    Change in Gait Speed Makes only minor adjustments to walking speed, or accomplishes a change in speed with significant gait deviations, deviates 10-15 in outside the 12 in walkway width, or changes speed but loses balance but is able to recover and continue walking.    Gait with Horizontal Head Turns Performs head turns with moderate changes in gait velocity, slows down, deviates 10-15 in outside 12 in walkway width but recovers, can continue to walk.    Gait with Vertical Head Turns Performs task with slight change in gait velocity (eg, minor disruption to smooth gait path), deviates 6 - 10 in outside 12 in walkway width or uses assistive device    Gait and Pivot Turn Pivot turns safely within 3 sec and stops quickly with no loss of balance.    Step Over Obstacle Is  able to step over one shoe box (4.5 in total height) without changing gait speed. No evidence of imbalance.    Gait with Narrow Base of Support Ambulates less than 4 steps heel to toe or cannot perform without assistance.    Gait with Eyes Closed Walks 20 ft, slow speed, abnormal gait pattern, evidence for imbalance, deviates 10-15 in outside 12 in walkway width. Requires more than 9 sec to ambulate 20 ft.    Ambulating Backwards Walks 20 ft, slow speed, abnormal gait pattern, evidence for imbalance, deviates 10-15 in outside 12 in walkway width.    Steps  Two feet to a stair, must use rail.    Total Score 13           Gait speed:  1.98 ft/sec 5x STS = 18.96 sec TUG score = 11.6 sec   PATIENT SURVEYS:  ABC scale: The Activities-Specific Balance Confidence (ABC) Scale 0% 10 20 30  40 50 60 70 80 90 100% No confidence<->completely confident  How confident are you that you will not lose your balance or become unsteady when you . . .   Date tested 11/22/2024       10/21/24  Walk around the house 70%   2. Walk up or down stairs 70%   3. Bend over and pick up a slipper from in front of a closet floor 80%   4. Reach for a small can off a shelf at eye level 100%   5. Stand on tip toes and reach for something above your head 40%   6. Stand on a chair and reach for something 0%   7. Sweep the floor 80%   8. Walk outside the house to a car parked in the driveway 80%   9. Get into or out of a car 100%   10. Walk across a parking lot to the mall 80%   11. Walk up or down a ramp 0%   12. Walk in a crowded mall where people rapidly walk past you 80%   13. Are bumped into by people as you walk through the mall 0%   14. Step onto or off of an escalator while you are holding onto the railing 8011%   15. Step onto or off an escalator while holding onto parcels such that you cannot hold onto the railing 0%   16. Walk outside on icy sidewalks 0%   Total: #/16 780/1600 = 48% 880 / 1600 = 55.0 %      TODAY'S TREATMENT:  11/22/24 Nustep L5x34min  Clock balance reaches randomly calling out each LE and color patient to follow directions Gait holding airex with cone sitting upright preventing cone from falling counting up by 2s to 50 then counted backward by 2s 3 lasp around gym CGA Sidestepping on balance beam with UE support CGA   [facility] PT Assessment - 11/22/24 0001       Berg Balance Test   Sit to Stand Able to stand  independently using hands    Standing Unsupported Able to stand safely 2 minutes    Sitting with Back Unsupported but Feet  Supported on Floor or Stool Able to sit safely and securely 2 minutes    Stand to Sit Controls descent by using hands    Transfers Able to transfer safely, definite need of hands    Standing Unsupported with Eyes Closed Able to stand 10 seconds safely    Standing Unsupported with Feet Together Able to place feet together independently  and stand 1 minute safely    From Standing, Reach Forward with Outstretched Arm Can reach confidently >25 cm (10)    From Standing Position, Pick up Object from Floor Able to pick up shoe safely and easily    From Standing Position, Turn to Look Behind Over each Shoulder Looks behind from both sides and weight shifts well    Turn 360 Degrees Able to turn 360 degrees safely but slowly    Standing Unsupported, Alternately Place Feet on Step/Stool Able to stand independently and complete 8 steps >20 seconds    Standing Unsupported, One Foot in Front Needs help to step but can hold 15 seconds    Standing on One Leg Tries to lift leg/unable to hold 3 seconds but remains standing independently    Total Score 44          11/19/24 THERAPEUTIC EXERCISE: To improve strength and endurance.  Demonstration, verbal and tactile cues throughout for technique. Bike L3 x 7'  NEUROMUSCULAR RE-EDUCATION: To improve proprioception and balance. [place] in front and behind x 40' x 4 Star/dot touch from 12-6:00 x 1 cycle each foot Cone touch from 9-3:00 calling out random colors x 4' Foam airex:  Weighted ball (yellow) basket toss forward x 1'  Weighted ball toss laterally x 1' R;  x 1' L  THERAPEUTIC ACTIVITIES: To improve functional performance.  Demonstration, verbal and tactile cues throughout for technique. Kick ball alternating feet with no UE support x 3' 6 step up and over and back x 5 F/B;   X 5 S/S   11/15/24 Bike L2x66min FGA, 5xSTS, tested LE strength,  ABC scale reviewed with patient as she was unable to see the paper  [facility] PT Assessment - 11/15/24 0001        Functional Gait  Assessment   Gait assessed  Yes    Gait Level Surface Walks 20 ft, slow speed, abnormal gait pattern, evidence for imbalance or deviates 10-15 in outside of the 12 in walkway width. Requires more than 7 sec to ambulate 20 ft.    Change in Gait Speed Able to change speed, demonstrates mild gait deviations, deviates 6-10 in outside of the 12 in walkway width, or no gait deviations, unable to achieve a major change in velocity, or uses a change in velocity, or uses an assistive device.    Gait with Horizontal Head Turns Performs head turns with moderate changes in gait velocity, slows down, deviates 10-15 in outside 12 in walkway width but recovers, can continue to walk.    Gait with Vertical Head Turns Performs head turns with no change in gait. Deviates no more than 6 in outside 12 in walkway width.    Gait and Pivot Turn Pivot turns safely within 3 sec and stops quickly with no loss of balance.    Step Over Obstacle Is able to step over one shoe box (4.5 in total height) without changing gait speed. No evidence of imbalance.    Gait with Narrow Base of Support Ambulates less than 4 steps heel to toe or cannot perform without assistance.    Gait with Eyes Closed Walks 20 ft, slow speed, abnormal gait pattern, evidence for imbalance, deviates 10-15 in outside 12 in walkway width. Requires more than 9 sec to ambulate 20 ft.    Ambulating Backwards Walks 20 ft, uses assistive device, slower speed, mild gait deviations, deviates 6-10 in outside 12 in walkway width.    Steps Two feet to a stair, must  use rail.    Total Score 16          10/28/24 Bike L2x59min Standing gastroc stretch BLE foot on foam roll 2 x 1 min  Step ups and over 4 inch step: fwd BLE x 10 no hands, lateral no hands x 10 SLS color touches with BLE no support therapist beside providing CGA, added cognitive challenges with calling out specific LE to specific dot Sit to stand with min assist x 5  Squats x 10 with  UE support Standing hip flex to abd 3lb x 10 BLE   10/25/24 NEUROMUSCULAR RE-EDUCATION: To improve balance, coordination, kinesthesia, and proprioception. Walking backward counting by 2's x 150' Walking backward w/ head turned to the R counting by 2's x 150' Walking forward head turned to the L counting by 2's x 150' Tandem gait forward counting by 2's x 150' Long foam balance x 1' Long foam sidestepping x 8 laps S/S SLS w/ 1 finger support x 30 sec x 3 each leg Clock cone touch from (9:00 to 3:00) w/ PT calling out a time and patient having to touch appropriate cone x 5' BLE  THERAPEUTIC ACTIVITIES: To improve functional performance.  Demonstration, verbal and tactile cues throughout for technique. Step up and across 4 aerobic step S/S X 10; F/B X 10  Sit to stand x 5   10/21/24 Nustep L5x27min ABC scale- 880 / 1600 = 55.0 %- reviewed with patient as she did not have her glasses  [facility] PT Assessment - 10/21/24 0001       Functional Gait  Assessment   Gait assessed  Yes    Gait Level Surface Walks 20 ft, slow speed, abnormal gait pattern, evidence for imbalance or deviates 10-15 in outside of the 12 in walkway width. Requires more than 7 sec to ambulate 20 ft.    Change in Gait Speed Makes only minor adjustments to walking speed, or accomplishes a change in speed with significant gait deviations, deviates 10-15 in outside the 12 in walkway width, or changes speed but loses balance but is able to recover and continue walking.    Gait with Horizontal Head Turns Performs head turns with moderate changes in gait velocity, slows down, deviates 10-15 in outside 12 in walkway width but recovers, can continue to walk.    Gait with Vertical Head Turns Performs task with slight change in gait velocity (eg, minor disruption to smooth gait path), deviates 6 - 10 in outside 12 in walkway width or uses assistive device    Gait and Pivot Turn Pivot turns safely within 3 sec and stops quickly with no  loss of balance.    Step Over Obstacle Is able to step over one shoe box (4.5 in total height) without changing gait speed. No evidence of imbalance.    Gait with Narrow Base of Support Ambulates less than 4 steps heel to toe or cannot perform without assistance.    Gait with Eyes Closed Walks 20 ft, slow speed, abnormal gait pattern, evidence for imbalance, deviates 10-15 in outside 12 in walkway width. Requires more than 9 sec to ambulate 20 ft.    Ambulating Backwards Walks 20 ft, slow speed, abnormal gait pattern, evidence for imbalance, deviates 10-15 in outside 12 in walkway width.    Steps Two feet to a stair, must use rail.    Total Score 13          10/14/24 THERAPEUTIC EXERCISE: To improve strength and endurance.  Demonstration, verbal and  tactile cues throughout for technique. Bike L3 x 7'  NEUROMUSCULAR RE-EDUCATION: To improve balance, coordination, kinesthesia, posture, and proprioception. Tandem gait F/B 30' x 4 each direction Braiding in front and behind 30' x 4 each way Foam sidestepping x 5 laps  4 step ups  w/ YTB resistance from back x 5, from each side x 5  for each leg w/ 1ue support  THERAPEUTIC ACTIVITIES: To improve functional performance.  Demonstration, verbal and tactile cues throughout for technique. - Up/down 1 flight of steps with R rail reciprocal pattern for ascending, nonreciprocal pattern for descent - seated at edge of table reaching laterally to touch floor x 10 each UE;  much more difficult going to the L - Table to floor transfer--patient preference is scooting to the edge of using both arms behind her on table to lower butt to floor (tricep method) min assist required -  Floor to table transfer with min assist for correct technique of side sitting with knees bent, reaching across body to get both hands on floor, getting to hands and knees, propping arms on table, bringing LLE up for 1/2 kneel and then to partial stand/squat pivot to table (patient  would prefer to use back to table method with arms behind her for tricep push up, but this is not ideal for her) - standing toe prop gastroc stretch x 1' x 3 each leg   10/11/24 Bike L2x21min Leg curls 15lb x 20 BLE Leg extension 5lb x 20 BLE Step ups BLE 2 x 10 6 1HA Educated on preventing falls risk and provided handout Tandem gait along counter 4x with support; 4x no support Braiding backward along counter 2x with support; 2x no support  09/30/24 Bike L2x52min Step ups BLE x 10 6 1HA Lateral step ups LLE x 10 1HA; RLE 2HA x 10 Standing hip abduction 2lb 2 x 10 BLE Standing hip extension 2lb 2 x 10 BLE Standing marching 2lb 2 x 10 BLE Seated LAQ 2lb 2x10 BLE Seated SLR 2lb 2x10 BLE  09/27/24 THERAPEUTIC EXERCISE: To improve strength and endurance.  Demonstration, verbal and tactile cues throughout for technique. NuStep L4 x 6'   NEUROMUSCULAR RE-EDUCATION: To improve balance, coordination, kinesthesia, and posture. Corner balance:  Toe raises x 20  Heel raises x 20  Marching x 20 BLE  EC stance x 1'   Tandem stance x 1' BLE  Step stance EO x 1' each leg;  EC x 1' ea leg  Theraband foam standing heel and toes off the edges EO x 1'; EC x 1' Tandem gait at counter F/B X 3 LAPS Standing rhythmic stabilization w/ black TB around waist pulling in all directions for challenges Seated on 75 cm swiss ball:  Bouncing EO x 1';  EC x 1;  Rocking F/B x 20;  S/S x 20  Pelvic circles x 20 CW;  x20 CCW  Alternate marching x 10  B shoulder flexion x 10  Alternate LAQ x 10  09/13/24 SELF CARE: Provided education on PT POC progression.and initial HEP   PATIENT EDUCATION:  Education details: HEP review  Person educated: Patient Education method: Explanation, Demonstration, Verbal cues, Tactile cues, and Handouts Education comprehension: verbalized understanding, verbal cues required, tactile cues required, and needs further education  HOME EXERCISE PROGRAM: Access Code:  [email or web address] URL: [email or web address] Date: 10/28/2024 Prepared by: Tamzin Bertling  Exercises - Tandem Stance in Corner  - 1 x daily - 7 x weekly - 3 sets - 10 reps - Single Leg  Stance  - 1 x daily - 7 x weekly - 3 sets - 10 reps - Heel Raises with Counter Support  - 1 x daily - 7 x weekly - 3 sets - 10 reps - Toe Raises with Counter Support  - 1 x daily - 7 x weekly - 3 sets - 10 reps - Gastroc Stretch with Foot at Wall  - 1 x daily - 7 x weekly - 2 sets - 2 reps - 1 min hold  Patient Education - What You Can Do to Prevent Falls   ASSESSMENT:  CLINICAL IMPRESSION: BERG score has improved by 1 point. Continued progressing balance interventions adding cognitive challenges, she had more difficulty with the backward counting by 2s. Foam standing is also difficult indicating that she would have problems on unlevel ground.   PT remains necessary for strength, balance, gait, safety deficits.   Continue per POC   RECERTIFICATION NOTE 11/15/24:  Vickie Burns has been seen for 9 PT visits over the last 2 months for unsteady gait/falls.    She is making progress to goals, but has not yet attained the goals that we have set for her.   However, she has good rehab potential to meet her goals.    Her FGA standardized balance assessment has improved from 13 to 16/30.   However her score still indicates that she is a high fall risk.   Her hip and ankle strength have improved by 1/2 to 1 full manual muscle test grade.   ABC scale for subjective balance confidence has improved from 48% to 55%.   Her gait speed, 5X sit to stand scores have been slower to improve, but we expect that they will improve since we have only had 9 visits.   The holiday break has also slowed us  down somewhat.    She is an excellent candidate for further PT and PT remains necessary for balance, gait, strength, safety, HEP deficits.   Continue per POC.    Recertification orders will be sent to MD for signature to continue  EVAL:   Vickie Burns is a 84 y.o. female who was referred to physical therapy for evaluation and treatment for gait abnormality.  Patient presents with physical impairments of impaired activity tolerance, impaired standing balance, impaired ambulation, and decreased safety awareness impacting safe and independent functional mobility.  Examination revealed patient is at risk for falls and functional decline as evidenced by the following objective test measures: Gait speed 1.98 ft/sec, (2.62 ft/sec is needed for community access), BERG score of 43/56;  TUG of 11.6 sec (>13.5 sec indicates increased risk for falls), and 5xSTS of 18.96 sec (>15 sec indicates increased risk for falls and decreased BLE power).  ABC scale score of 48% indicates a moderate level of physical functioning.  Vickie Burns will benefit from skilled PT to address above deficits to improve mobility and activity tolerance to help reach the maximal level of functional independence and mobility. Patient demonstrates understanding of this POC and is in agreement with this plan.   OBJECTIVE IMPAIRMENTS: difficulty walking, decreased strength, postural dysfunction, and pain.   ACTIVITY LIMITATIONS: carrying, lifting, bending, squatting, and locomotion level  PARTICIPATION LIMITATIONS: cleaning, laundry, shopping, and community activity  PERSONAL FACTORS: Age, Fitness, and 1-2 comorbidities: Afib, Diabetes, neuropathy, retinopathy, osteoporosis, h/o falls, h/o L breast CA  are also affecting patient's functional outcome.   REHAB POTENTIAL: Good  CLINICAL DECISION MAKING: Evolving/moderate complexity  EVALUATION COMPLEXITY: Moderate   GOALS: Goals reviewed with patient? Yes  SHORT TERM GOALS: Target date: 12/14/2024   Patient will be independent with advanced/ongoing HEP to facilitate ability to maintain/progress functional gains from skilled physical therapy services. Baseline: no advanced HEP yet Goal status: IN PROGRESS- 11/16/24- partial  compliance with HEP   3.  Patient will be able to step up/down curb safely with LRAD for safety with community ambulation.  Baseline: able to do 1 flight of steps with 1 rail support Goal status: IN PROGRESS- 11/15/24 able to do full flight of stairs with HR, goes down sideways   4.  Patient will demonstrate improved  BLE strength to >/= 5/5 for improved stability and ease of mobility Baseline: Refer to above LE MMT table Goal status: IN PROGRESS- 11/15/24 see chart   LONG TERM GOALS: Target date: 01/11/2025     5.  Patient will improve 5xSTS time to </= 14 seconds for improved efficiency and safety with transfers. Baseline: 18.96 sec Goal status: IN PROGRESS- 11/15/24 20 seconds with UE support   6.  Patient will demonstrate gait speed of >/= 2.62 ft/sec (0.55 m/s) to be a safe limited community ambulator with decreased risk for recurrent falls.  Baseline: 1.98 Goal status: IN PROGRESS- 1.25 m/s 10/21/24; 0.94 m/s  7.  Patient will improve Berg score to >/= 51/56 to improve safety and stability with ADLs in standing and reduce risk for falls. (MCID= 8 points)  Baseline: 43 Goal status: INITIAL  8.  Patient will demonstrate at least 19/24 on DGI to improve gait stability and reduce risk for falls. Baseline: TBD Goal status: INITIAL  9. Patient will improve FGA score to at least 19/30 to improve gait stability and reduce risk for falls. Baseline: TBD Goal status: IN PROGRESS- 11/15/24- 16/30  10.  Patient will report >/= 70% on ABC scale (MCID = 19%) to demonstrate improved balance confidence with functional mobility and gait. Baseline: 48% Goal status: IN PROGRESS- 10/21/24 880 / 1600 = 55.0 %   PLAN:  PT FREQUENCY: 1-2x/week  PT DURATION: 8 weeks  PLANNED INTERVENTIONS: 97164- PT Re-evaluation, 97750- Physical Performance Testing, 97110-Therapeutic exercises, 97530- Therapeutic activity, 97112- Neuromuscular re-education, 97535- Self Care, [id number]- Manual therapy, [phone]- Gait  training, Patient/Family education, Balance training, and Stair training  PLAN FOR NEXT SESSION:  Add cognitive challenges with gait and tandem walk, foam standing, walking with support   Sol LITTIE Gaskins, PTA  11/22/2024, 11:46 AM   "

## 2024-11-24 NOTE — Therapy (Signed)
 " OUTPATIENT PHYSICAL THERAPY NEURO TREATMENT   Patient Name: Vickie Burns MRN: [id number] DOB:11-24-1940, 84 y.o., female Today's Date: 11/25/2024   END OF SESSION:  PT End of Session - 11/25/24 1209     Visit Number 12    Date for Recertification  11/08/24    PT Start Time 1151    PT Stop Time 1235    PT Time Calculation (min) 44 min    Activity Tolerance Patient tolerated treatment well;No increased pain    Behavior During Therapy [facility] for tasks assessed/performed                  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   Essential hypertension 10/07/2016   Fall at home, initial encounter 04/05/2022   Family history of breast cancer    aunt   History of adenomatous polyp of colon 02/12/2021   HLD (hyperlipidemia) 08/17/2012   Hypercholesteremia 2000   Hypercholesterolemia 2000   Hypertension    history   Long term (current) use of anticoagulants 12/14/2021   Long term (current) use of insulin  (HCC) 02/12/2021   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   New onset a-fib (HCC) 06/28/2020   Osteoporosis 09/06/2016   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   Persistent atrial fibrillation (HCC) 06/07/2021   Personal history of malignant neoplasm of breast 02/12/2021   Personal history of radiation therapy    Postmenopausal    took HRT 1999 - 2004   Preop cardiovascular exam 07/03/2020   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   Traumatic  ecchymosis of right lower leg 04/05/2022   Unspecified atrial fibrillation (HCC) 07/03/2020   Past Surgical History:  Procedure Laterality Date   BREAST BIOPSY     BREAST BIOPSY Left 10/29/2022   MM LT BREAST BX W LOC DEV 1ST LESION IMAGE BX SPEC STEREO GUIDE 10/29/2022 [facility]   BREAST EXCISIONAL BIOPSY Left 09/07/2020   BREAST LUMPECTOMY  04/2009   rt breast  estrogen +, Her 2 Nu negative   BREAST LUMPECTOMY WITH RADIOACTIVE SEED LOCALIZATION Left 09/07/2020   Procedure: RADIOCATIVE SEED GUIDED LEFT BREAST LUMPECTOMY;  Surgeon: Ethyl Lenis, MD;  Location: [facility];  Service: General;  Laterality: Left;   COLONOSCOPY WITH PROPOFOL  N/A 04/05/2014   Procedure: COLONOSCOPY WITH PROPOFOL ;  Surgeon: Gladis MARLA Louder, MD;  Location: [facility];  Service: Endoscopy;  Laterality: N/A;   ORIF FOOT FRACTURE Right 04/20/2014   done in [facility] TOOTH EXTRACTION     Patient Active Problem List   Diagnosis Date Noted   Nuclear sclerotic cataract of left eye 02/26/2024   Osteopenia of left forearm 12/09/2023   Well woman exam with routine gynecological exam 12/04/2023   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   Acute blood loss anemia 04/05/2022   Traumatic ecchymosis of right lower leg 04/05/2022   Fall at home, initial encounter 04/05/2022   Long term (current) use of anticoagulants 12/14/2021   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   Coronary arteriosclerosis 09/25/2021   Persistent atrial  fibrillation (HCC) 06/07/2021   Diabetic peripheral neuropathy associated with type 2 diabetes mellitus (HCC) 02/12/2021   Diabetic retinopathy associated with type 2 diabetes mellitus (HCC) 02/12/2021   History of adenomatous polyp of colon 02/12/2021   Long term (current) use of insulin  (HCC) 02/12/2021   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   Personal history of malignant neoplasm of breast 02/12/2021   Atrial fibrillation (HCC) 02/12/2021   Family history of  breast cancer    Hypertension    Personal history of radiation therapy    Postmenopausal    Diabetes mellitus due to underlying condition with unspecified complications (HCC) 07/03/2020   Unspecified atrial fibrillation (HCC) 07/03/2020   New onset a-fib (HCC) 06/28/2020   Essential hypertension 10/07/2016   Osteoporosis 09/06/2016   Macular degeneration 2016   HLD (hyperlipidemia) 08/17/2012   Diabetes mellitus, type 2 (HCC) 01/27/2012   Breast CA (HCC) 12/20/2011   Hypercholesterolemia 2000   Hypercholesteremia 2000    PCP: Frann Mabel Mt, DO   REFERRING PROVIDER: Frann Mabel Mt, DO   REFERRING DIAG: R26.9 (ICD-10-CM) - Gait abnormality  THERAPY DIAG:  Difficulty in walking, not elsewhere classified  Muscle weakness (generalized)  Abnormal posture  RATIONALE FOR EVALUATION AND TREATMENT: Rehabilitation  ONSET DATE: >1 year, but worsening progressively over last few months  NEXT MD VISIT:    SUBJECTIVE:                                                                                                                                                                                                         SUBJECTIVE STATEMENT: Patient reports feels better than she did on evaluation.   States she is feeling much more confident and steady with her gait.     84 y/o female referred to PT from PCP for gait abnormality.   She is also having some memory deficits and brain MRI is pending.  She is also supposed to see a neurologist  Pt accompanied by: self  PAIN: Are you having pain? No  PERTINENT HISTORY:  Afib, Diabetes, neuropathy, retinopathy, osteoporosis, h/o falls, h/o L breast CA   PRECAUTIONS: Other: L breast surgery/lumpectomy  RED FLAGS: None  WEIGHT BEARING RESTRICTIONS: No  FALLS:  Has patient fallen in last 6 months? Yes. Number of falls several  LIVING ENVIRONMENT: Lives with: lives alone Lives in: House/apartment Stairs: Yes:  Internal: 14 steps; on right going up and External: 6 steps; bilateral but cannot reach both Has following equipment at home: Single point cane, Walker - 2 wheeled, bed side commode, and Grab bars  OCCUPATION: works for [employer]  PLOF: Independent with gait  PATIENT GOALS: have better balance   OBJECTIVE: (objective measures completed at initial evaluation unless otherwise dated)  DIAGNOSTIC FINDINGS:  Brain MRI is pending  COGNITION: Overall cognitive status: Within functional limits for tasks assessed   SENSATION: WFL  COORDINATION: WNL  EDEMA:  Mild BLE  MUSCLE TONE: WNL  DTRs:  NT  POSTURE:  rounded shoulders and forward head     LOWER EXTREMITY ROM:    MMT Right eval Left eval  Hip flexion    Hip extension    Hip abduction    Hip adduction    Hip internal rotation    Hip external rotation    Knee flexion    Knee extension    Ankle dorsiflexion    Ankle plantarflexion    Ankle inversion    Ankle eversion    (Blank rows = not tested) LOWER EXTREMITY MMT:     Active  Right eval Left eval R 11/15/24 L 11/15/24  Hip flexion 4 4 4+ 4+  Hip extension      Hip abduction 4 4 5- sitting 5 sitting  Hip adduction      Hip internal rotation 4+ 4    Hip external rotation 4 4+    Knee flexion 5 5 4+ 4+  Knee extension 5 5 4+ 4+  Ankle dorsiflexion 4 4- 4+ 4  Ankle plantarflexion      Ankle inversion      Ankle eversion       (Blank rows = not tested) BED MOBILITY:  Rolling to Right CGA Rolling to Left Complete Independence  TRANSFERS: Assistive device utilized: None  Sit to stand: Modified independence Stand to sit: Modified independence Chair to chair: Modified independence Floor: NT  GAIT: Distance walked:  clinic distances and into clinic from parking lot Assistive device utilized: None Level of assistance: SBA Gait pattern: decreased step length- Right, decreased step length- Left, decreased stance time- Right, decreased stance  time- Left, decreased ankle dorsiflexion- Right, and decreased ankle dorsiflexion- Left Comments:   FUNCTIONAL TESTS:  PHYSICAL PERFORMANCE TEST or MEASUREMENT:   [facility] PT Assessment - 10/21/24 0001       Functional Gait  Assessment   Gait assessed  Yes    Gait Level Surface Walks 20 ft, slow speed, abnormal gait pattern, evidence for imbalance or deviates 10-15 in outside of the 12 in walkway width. Requires more than 7 sec to ambulate 20 ft.    Change in Gait Speed Makes only minor adjustments to walking speed, or accomplishes a change in speed with significant gait deviations, deviates 10-15 in outside the 12 in walkway width, or changes speed but loses balance but is able to recover and continue walking.    Gait with Horizontal Head Turns Performs head turns with moderate changes in gait velocity, slows down, deviates 10-15 in outside 12 in walkway width but recovers, can continue to walk.    Gait with Vertical Head Turns Performs task with slight change in gait velocity (eg, minor disruption to smooth gait path), deviates 6 - 10 in outside 12 in walkway width or uses assistive device    Gait and Pivot Turn Pivot turns safely within 3 sec and stops quickly with no loss of balance.    Step Over Obstacle Is able to step over one shoe box (4.5 in total height) without changing gait speed. No evidence of imbalance.    Gait with Narrow Base of  Support Ambulates less than 4 steps heel to toe or cannot perform without assistance.    Gait with Eyes Closed Walks 20 ft, slow speed, abnormal gait pattern, evidence for imbalance, deviates 10-15 in outside 12 in walkway width. Requires more than 9 sec to ambulate 20 ft.    Ambulating Backwards Walks 20 ft, slow speed, abnormal gait pattern, evidence for imbalance, deviates 10-15 in outside 12 in walkway width.    Steps Two feet to a stair, must use rail.    Total Score 13           Gait speed:  1.98 ft/sec 5x STS = 18.96 sec TUG score = 11.6 sec    PATIENT SURVEYS:  ABC scale: The Activities-Specific Balance Confidence (ABC) Scale 0% 10 20 30  40 50 60 70 80 90 100% No confidence<->completely confident  How confident are you that you will not lose your balance or become unsteady when you . . .   Date tested 11/25/2024       10/21/24  Walk around the house 70%   2. Walk up or down stairs 70%   3. Bend over and pick up a slipper from in front of a closet floor 80%   4. Reach for a small can off a shelf at eye level 100%   5. Stand on tip toes and reach for something above your head 40%   6. Stand on a chair and reach for something 0%   7. Sweep the floor 80%   8. Walk outside the house to a car parked in the driveway 80%   9. Get into or out of a car 100%   10. Walk across a parking lot to the mall 80%   11. Walk up or down a ramp 0%   12. Walk in a crowded mall where people rapidly walk past you 80%   13. Are bumped into by people as you walk through the mall 0%   14. Step onto or off of an escalator while you are holding onto the railing 8011%   15. Step onto or off an escalator while holding onto parcels such that you cannot hold onto the railing 0%   16. Walk outside on icy sidewalks 0%   Total: #/16 780/1600 = 48% 880 / 1600 = 55.0 %      TODAY'S TREATMENT:  11/25/24 THERAPEUTIC ACTIVITIES: To improve functional performance.  Demonstration, verbal and tactile cues throughout for technique. Sit to stand on long foam x 2/5  Sidestepping on long foam x 5 laps Toe raises with heels hanging off long foam x 20 BLE F/B gait on long foam x 5 laps  NEUROMUSCULAR RE-EDUCATION: To improve kinesthesia, proprioception, and balance. Carrying a tray with a cone with disc balanced on top around gym x 1 lap forward;  x 1 lap backward 4 step up and down with tray/cone/disc carry x 10  Large foam roll step overs with tray/cone/disc carry x 5 F/B;  X 5 S/S   11/22/24 Nustep L5x32min  Clock balance reaches randomly calling out each  LE and color patient to follow directions Gait holding airex with cone sitting upright preventing cone from falling counting up by 2s to 50 then counted backward by 2s 3 lasp around gym CGA Sidestepping on balance beam with UE support CGA   [facility] PT Assessment - 11/22/24 0001       Berg Balance Test   Sit to Stand Able to stand  independently using hands  Standing Unsupported Able to stand safely 2 minutes    Sitting with Back Unsupported but Feet Supported on Floor or Stool Able to sit safely and securely 2 minutes    Stand to Sit Controls descent by using hands    Transfers Able to transfer safely, definite need of hands    Standing Unsupported with Eyes Closed Able to stand 10 seconds safely    Standing Unsupported with Feet Together Able to place feet together independently and stand 1 minute safely    From Standing, Reach Forward with Outstretched Arm Can reach confidently >25 cm (10)    From Standing Position, Pick up Object from Floor Able to pick up shoe safely and easily    From Standing Position, Turn to Look Behind Over each Shoulder Looks behind from both sides and weight shifts well    Turn 360 Degrees Able to turn 360 degrees safely but slowly    Standing Unsupported, Alternately Place Feet on Step/Stool Able to stand independently and complete 8 steps >20 seconds    Standing Unsupported, One Foot in Front Needs help to step but can hold 15 seconds    Standing on One Leg Tries to lift leg/unable to hold 3 seconds but remains standing independently    Total Score 44          11/19/24 THERAPEUTIC EXERCISE: To improve strength and endurance.  Demonstration, verbal and tactile cues throughout for technique. Bike L3 x 7'  NEUROMUSCULAR RE-EDUCATION: To improve proprioception and balance. [place] in front and behind x 40' x 4 Star/dot touch from 12-6:00 x 1 cycle each foot Cone touch from 9-3:00 calling out random colors x 4' Foam airex:  Weighted ball (yellow) basket  toss forward x 1'  Weighted ball toss laterally x 1' R;  x 1' L  THERAPEUTIC ACTIVITIES: To improve functional performance.  Demonstration, verbal and tactile cues throughout for technique. Kick ball alternating feet with no UE support x 3' 6 step up and over and back x 5 F/B;   X 5 S/S    PATIENT EDUCATION:  Education details: HEP review  Person educated: Patient Education method: Explanation, Demonstration, Verbal cues, Tactile cues, and Handouts Education comprehension: verbalized understanding, verbal cues required, tactile cues required, and needs further education  HOME EXERCISE PROGRAM: Access Code: [email or web address] URL: [email or web address] Date: 10/28/2024 Prepared by: Sol Gaskins  Exercises - Tandem Stance in Corner  - 1 x daily - 7 x weekly - 3 sets - 10 reps - Single Leg Stance  - 1 x daily - 7 x weekly - 3 sets - 10 reps - Heel Raises with Counter Support  - 1 x daily - 7 x weekly - 3 sets - 10 reps - Toe Raises with Counter Support  - 1 x daily - 7 x weekly - 3 sets - 10 reps - Gastroc Stretch with Foot at Wall  - 1 x daily - 7 x weekly - 2 sets - 2 reps - 1 min hold  Patient Education - What You Can Do to Prevent Falls   ASSESSMENT:  CLINICAL IMPRESSION: Patient continues to make progress and is moving to more challenging functional activities now.   Added tray carrying with object balancing on the tray with functional movements.  She does have difficulty with this activity and drops the objects at times.   She requires min to CGA to maintain balance.   Will continue to work on cognitive and functional tasks  RECERTIFICATION NOTE 11/15/24:  Vickie Burns has  been seen for 9 PT visits over the last 2 months for unsteady gait/falls.    She is making progress to goals, but has not yet attained the goals that we have set for her.   However, she has good rehab potential to meet her goals.    Her FGA standardized balance assessment has improved from 13 to 16/30.    However her score still indicates that she is a high fall risk.   Her hip and ankle strength have improved by 1/2 to 1 full manual muscle test grade.   ABC scale for subjective balance confidence has improved from 48% to 55%.   Her gait speed, 5X sit to stand scores have been slower to improve, but we expect that they will improve since we have only had 9 visits.   The holiday break has also slowed us  down somewhat.    She is an excellent candidate for further PT and PT remains necessary for balance, gait, strength, safety, HEP deficits.   Continue per POC.    Recertification orders will be sent to MD for signature to continue  EVAL:  Vickie Burns is a 83 y.o. female who was referred to physical therapy for evaluation and treatment for gait abnormality.  Patient presents with physical impairments of impaired activity tolerance, impaired standing balance, impaired ambulation, and decreased safety awareness impacting safe and independent functional mobility.  Examination revealed patient is at risk for falls and functional decline as evidenced by the following objective test measures: Gait speed 1.98 ft/sec, (2.62 ft/sec is needed for community access), BERG score of 43/56;  TUG of 11.6 sec (>13.5 sec indicates increased risk for falls), and 5xSTS of 18.96 sec (>15 sec indicates increased risk for falls and decreased BLE power).  ABC scale score of 48% indicates a moderate level of physical functioning.  Vickie Burns will benefit from skilled PT to address above deficits to improve mobility and activity tolerance to help reach the maximal level of functional independence and mobility. Patient demonstrates understanding of this POC and is in agreement with this plan.   OBJECTIVE IMPAIRMENTS: difficulty walking, decreased strength, postural dysfunction, and pain.   ACTIVITY LIMITATIONS: carrying, lifting, bending, squatting, and locomotion level  PARTICIPATION LIMITATIONS: cleaning, laundry, shopping, and  community activity  PERSONAL FACTORS: Age, Fitness, and 1-2 comorbidities: Afib, Diabetes, neuropathy, retinopathy, osteoporosis, h/o falls, h/o L breast CA  are also affecting patient's functional outcome.   REHAB POTENTIAL: Good  CLINICAL DECISION MAKING: Evolving/moderate complexity  EVALUATION COMPLEXITY: Moderate   GOALS: Goals reviewed with patient? Yes  SHORT TERM GOALS: Target date: 12/14/2024   Patient will be independent with advanced/ongoing HEP to facilitate ability to maintain/progress functional gains from skilled physical therapy services. Baseline: no advanced HEP yet 11/25/24:  patient can teach back HEP Goal status: MET-   3.  Patient will be able to step up/down curb safely with LRAD for safety with community ambulation.  Baseline: able to do 1 flight of steps with 1 rail support 11/25/24:  able to do 4 step ups/downs while carrying a tray with SBA/CGA Goal status: IN PROGRESS-    4.  Patient will demonstrate improved  BLE strength to >/= 5/5 for improved stability and ease of mobility Baseline: Refer to above LE MMT table Goal status: IN PROGRESS- 11/15/24 see chart   LONG TERM GOALS: Target date: 01/11/2025     5.  Patient will improve 5xSTS time to </= 14 seconds for improved efficiency and safety with transfers. Baseline: 18.96  sec Goal status: IN PROGRESS- 11/15/24 20 seconds with UE support   6.  Patient will demonstrate gait speed of >/= 2.62 ft/sec (0.55 m/s) to be a safe limited community ambulator with decreased risk for recurrent falls.  Baseline: 1.98 Goal status: IN PROGRESS- 1.25 m/s 10/21/24; 0.94 m/s  7.  Patient will improve Berg score to >/= 51/56 to improve safety and stability with ADLs in standing and reduce risk for falls. (MCID= 8 points)  Baseline: 43 11/22/24:  44/56 Goal status: IN PROGRESS  8.  Patient will demonstrate at least 19/24 on DGI to improve gait stability and reduce risk for falls. Baseline: TBD Goal status:  INITIAL  9. Patient will improve FGA score to at least 19/30 to improve gait stability and reduce risk for falls. Baseline: TBD Goal status: IN PROGRESS- 11/15/24- 16/30  10.  Patient will report >/= 70% on ABC scale (MCID = 19%) to demonstrate improved balance confidence with functional mobility and gait. Baseline: 48% Goal status: IN PROGRESS- 10/21/24 880 / 1600 = 55.0 %   PLAN:  PT FREQUENCY: 1-2x/week  PT DURATION: 8 weeks  PLANNED INTERVENTIONS: 97164- PT Re-evaluation, 97750- Physical Performance Testing, 97110-Therapeutic exercises, 97530- Therapeutic activity, 97112- Neuromuscular re-education, 97535- Self Care, [id number]- Manual therapy, [phone]- Gait training, Patient/Family education, Balance training, and Stair training  PLAN FOR NEXT SESSION:  Add cognitive challenges with bacwkard gait and tandem walk, foam marching/counting/carrying   Vickie Burns, PT  11/25/2024, 1:25 PM   "

## 2024-11-25 ENCOUNTER — Encounter: Payer: Self-pay | Admitting: Rehabilitation

## 2024-11-25 ENCOUNTER — Ambulatory Visit: Admitting: Rehabilitation

## 2024-11-25 DIAGNOSIS — R293 Abnormal posture: Secondary | ICD-10-CM

## 2024-11-25 DIAGNOSIS — R262 Difficulty in walking, not elsewhere classified: Secondary | ICD-10-CM

## 2024-11-25 DIAGNOSIS — M6281 Muscle weakness (generalized): Secondary | ICD-10-CM

## 2024-11-26 ENCOUNTER — Encounter: Payer: Self-pay | Admitting: Family Medicine

## 2024-11-26 DIAGNOSIS — Z1231 Encounter for screening mammogram for malignant neoplasm of breast: Secondary | ICD-10-CM

## 2024-11-29 ENCOUNTER — Ambulatory Visit

## 2024-11-29 DIAGNOSIS — R262 Difficulty in walking, not elsewhere classified: Secondary | ICD-10-CM | POA: Diagnosis not present

## 2024-11-29 DIAGNOSIS — R293 Abnormal posture: Secondary | ICD-10-CM

## 2024-11-29 DIAGNOSIS — M6281 Muscle weakness (generalized): Secondary | ICD-10-CM

## 2024-11-29 NOTE — Therapy (Signed)
 " OUTPATIENT PHYSICAL THERAPY NEURO TREATMENT   Patient Name: Vickie Burns MRN: [id number] DOB:Mar 16, 1941, 84 y.o., female Today's Date: 11/29/2024   END OF SESSION:  PT End of Session - 11/29/24 1235     Visit Number 13    Date for Recertification  11/08/24    PT Start Time 1147    PT Stop Time 1230    PT Time Calculation (min) 43 min    Activity Tolerance Patient tolerated treatment well;No increased pain    Behavior During Therapy [facility] for tasks assessed/performed                   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   Essential hypertension 10/07/2016   Fall at home, initial encounter 04/05/2022   Family history of breast cancer    aunt   History of adenomatous polyp of colon 02/12/2021   HLD (hyperlipidemia) 08/17/2012   Hypercholesteremia 2000   Hypercholesterolemia 2000   Hypertension    history   Long term (current) use of anticoagulants 12/14/2021   Long term (current) use of insulin  (HCC) 02/12/2021   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   New onset a-fib (HCC) 06/28/2020   Osteoporosis 09/06/2016   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   Persistent atrial fibrillation (HCC) 06/07/2021   Personal history of malignant neoplasm of breast 02/12/2021   Personal history of radiation therapy    Postmenopausal    took HRT 1999 - 2004   Preop cardiovascular exam 07/03/2020   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   Traumatic  ecchymosis of right lower leg 04/05/2022   Unspecified atrial fibrillation (HCC) 07/03/2020   Past Surgical History:  Procedure Laterality Date   BREAST BIOPSY     BREAST BIOPSY Left 10/29/2022   MM LT BREAST BX W LOC DEV 1ST LESION IMAGE BX SPEC STEREO GUIDE 10/29/2022 [facility]   BREAST EXCISIONAL BIOPSY Left 09/07/2020   BREAST LUMPECTOMY  04/2009   rt breast  estrogen +, Her 2 Nu negative   BREAST LUMPECTOMY WITH RADIOACTIVE SEED LOCALIZATION Left 09/07/2020   Procedure: RADIOCATIVE SEED GUIDED LEFT BREAST LUMPECTOMY;  Surgeon: Ethyl Lenis, MD;  Location: [facility];  Service: General;  Laterality: Left;   COLONOSCOPY WITH PROPOFOL  N/A 04/05/2014   Procedure: COLONOSCOPY WITH PROPOFOL ;  Surgeon: Gladis MARLA Louder, MD;  Location: [facility];  Service: Endoscopy;  Laterality: N/A;   ORIF FOOT FRACTURE Right 04/20/2014   done in [place] TOOTH EXTRACTION     Patient Active Problem List   Diagnosis Date Noted   Nuclear sclerotic cataract of left eye 02/26/2024   Osteopenia of left forearm 12/09/2023   Well woman exam with routine gynecological exam 12/04/2023   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   Acute blood loss anemia 04/05/2022   Traumatic ecchymosis of right lower leg 04/05/2022   Fall at home, initial encounter 04/05/2022   Long term (current) use of anticoagulants 12/14/2021   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   Coronary arteriosclerosis 09/25/2021   Persistent  atrial fibrillation (HCC) 06/07/2021   Diabetic peripheral neuropathy associated with type 2 diabetes mellitus (HCC) 02/12/2021   Diabetic retinopathy associated with type 2 diabetes mellitus (HCC) 02/12/2021   History of adenomatous polyp of colon 02/12/2021   Long term (current) use of insulin  (HCC) 02/12/2021   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   Personal history of malignant neoplasm of breast 02/12/2021   Atrial fibrillation (HCC) 02/12/2021   Family history of  breast cancer    Hypertension    Personal history of radiation therapy    Postmenopausal    Diabetes mellitus due to underlying condition with unspecified complications (HCC) 07/03/2020   Unspecified atrial fibrillation (HCC) 07/03/2020   New onset a-fib (HCC) 06/28/2020   Essential hypertension 10/07/2016   Osteoporosis 09/06/2016   Macular degeneration 2016   HLD (hyperlipidemia) 08/17/2012   Diabetes mellitus, type 2 (HCC) 01/27/2012   Breast CA (HCC) 12/20/2011   Hypercholesterolemia 2000   Hypercholesteremia 2000    PCP: Frann Mabel Mt, DO   REFERRING PROVIDER: Frann Mabel Mt, DO   REFERRING DIAG: R26.9 (ICD-10-CM) - Gait abnormality  THERAPY DIAG:  Difficulty in walking, not elsewhere classified  Muscle weakness (generalized)  Abnormal posture  RATIONALE FOR EVALUATION AND TREATMENT: Rehabilitation  ONSET DATE: >1 year, but worsening progressively over last few months  NEXT MD VISIT:    SUBJECTIVE:                                                                                                                                                                                                         SUBJECTIVE STATEMENT: Patient reports feels better than she did on evaluation.   States she is feeling much more confident and steady with her gait.     84 y/o female referred to PT from PCP for gait abnormality.   She is also having some memory deficits and brain MRI is pending.  She is also supposed to see a neurologist  Pt accompanied by: self  PAIN: Are you having pain? No  PERTINENT HISTORY:  Afib, Diabetes, neuropathy, retinopathy, osteoporosis, h/o falls, h/o L breast CA   PRECAUTIONS: Other: L breast surgery/lumpectomy  RED FLAGS: None  WEIGHT BEARING RESTRICTIONS: No  FALLS:  Has patient fallen in last 6 months? Yes. Number of falls several  LIVING ENVIRONMENT: Lives with: lives alone Lives in: House/apartment Stairs: Yes:  Internal: 14 steps; on right going up and External: 6 steps; bilateral but cannot reach both Has following equipment at home: Single point cane, Walker - 2 wheeled, bed side commode, and Grab bars  OCCUPATION: works for [employer]  PLOF: Independent with gait  PATIENT GOALS: have better balance   OBJECTIVE: (objective measures completed at initial evaluation unless otherwise dated)  DIAGNOSTIC FINDINGS:  Brain MRI is pending  COGNITION: Overall cognitive status: Within functional limits for tasks assessed   SENSATION: WFL  COORDINATION: WNL  EDEMA:  Mild BLE  MUSCLE TONE: WNL  DTRs:  NT  POSTURE:  rounded shoulders and forward head     LOWER EXTREMITY ROM:    MMT Right eval Left eval  Hip flexion    Hip extension    Hip abduction    Hip adduction    Hip internal rotation    Hip external rotation    Knee flexion    Knee extension    Ankle dorsiflexion    Ankle plantarflexion    Ankle inversion    Ankle eversion    (Blank rows = not tested) LOWER EXTREMITY MMT:     Active  Right eval Left eval R 11/15/24 L 11/15/24  Hip flexion 4 4 4+ 4+  Hip extension      Hip abduction 4 4 5- sitting 5 sitting  Hip adduction      Hip internal rotation 4+ 4    Hip external rotation 4 4+    Knee flexion 5 5 4+ 4+  Knee extension 5 5 4+ 4+  Ankle dorsiflexion 4 4- 4+ 4  Ankle plantarflexion      Ankle inversion      Ankle eversion       (Blank rows = not tested) BED MOBILITY:  Rolling to Right CGA Rolling to Left Complete Independence  TRANSFERS: Assistive device utilized: None  Sit to stand: Modified independence Stand to sit: Modified independence Chair to chair: Modified independence Floor: NT  GAIT: Distance walked:  clinic distances and into clinic from parking lot Assistive device utilized: None Level of assistance: SBA Gait pattern: decreased step length- Right, decreased step length- Left, decreased stance time- Right, decreased stance  time- Left, decreased ankle dorsiflexion- Right, and decreased ankle dorsiflexion- Left Comments:   FUNCTIONAL TESTS:  PHYSICAL PERFORMANCE TEST or MEASUREMENT:   [facility] PT Assessment - 10/21/24 0001       Functional Gait  Assessment   Gait assessed  Yes    Gait Level Surface Walks 20 ft, slow speed, abnormal gait pattern, evidence for imbalance or deviates 10-15 in outside of the 12 in walkway width. Requires more than 7 sec to ambulate 20 ft.    Change in Gait Speed Makes only minor adjustments to walking speed, or accomplishes a change in speed with significant gait deviations, deviates 10-15 in outside the 12 in walkway width, or changes speed but loses balance but is able to recover and continue walking.    Gait with Horizontal Head Turns Performs head turns with moderate changes in gait velocity, slows down, deviates 10-15 in outside 12 in walkway width but recovers, can continue to walk.    Gait with Vertical Head Turns Performs task with slight change in gait velocity (eg, minor disruption to smooth gait path), deviates 6 - 10 in outside 12 in walkway width or uses assistive device    Gait and Pivot Turn Pivot turns safely within 3 sec and stops quickly with no loss of balance.    Step Over Obstacle Is able to step over one shoe box (4.5 in total height) without changing gait speed. No evidence of imbalance.    Gait with Narrow Base of  Support Ambulates less than 4 steps heel to toe or cannot perform without assistance.    Gait with Eyes Closed Walks 20 ft, slow speed, abnormal gait pattern, evidence for imbalance, deviates 10-15 in outside 12 in walkway width. Requires more than 9 sec to ambulate 20 ft.    Ambulating Backwards Walks 20 ft, slow speed, abnormal gait pattern, evidence for imbalance, deviates 10-15 in outside 12 in walkway width.    Steps Two feet to a stair, must use rail.    Total Score 13           Gait speed:  1.98 ft/sec 5x STS = 18.96 sec TUG score = 11.6 sec    PATIENT SURVEYS:  ABC scale: The Activities-Specific Balance Confidence (ABC) Scale 0% 10 20 30  40 50 60 70 80 90 100% No confidence<->completely confident  How confident are you that you will not lose your balance or become unsteady when you . . .   Date tested 11/29/2024       10/21/24  Walk around the house 70%   2. Walk up or down stairs 70%   3. Bend over and pick up a slipper from in front of a closet floor 80%   4. Reach for a small can off a shelf at eye level 100%   5. Stand on tip toes and reach for something above your head 40%   6. Stand on a chair and reach for something 0%   7. Sweep the floor 80%   8. Walk outside the house to a car parked in the driveway 80%   9. Get into or out of a car 100%   10. Walk across a parking lot to the mall 80%   11. Walk up or down a ramp 0%   12. Walk in a crowded mall where people rapidly walk past you 80%   13. Are bumped into by people as you walk through the mall 0%   14. Step onto or off of an escalator while you are holding onto the railing 8011%   15. Step onto or off an escalator while holding onto parcels such that you cannot hold onto the railing 0%   16. Walk outside on icy sidewalks 0%   Total: #/16 780/1600 = 48% 880 / 1600 = 55.0 %      TODAY'S TREATMENT:  11/29/24 Bike L3x34min Seated ball squeeze with LAQ BLE x 12  Carrying a tray with a cone with disc balanced on top around gym x 1 lap; 1 additional lap while holding only LUE 4 step up and down with tray/cone/disc carry x 10  Step up and over 4 step BLE x 10 while carrying tray Sidestepping along balance beam 3x each way- more LOB today Sit to stand x 5 - airex pad minimal UE use  11/25/24 THERAPEUTIC ACTIVITIES: To improve functional performance.  Demonstration, verbal and tactile cues throughout for technique. Sit to stand on long foam x 2/5  Sidestepping on long foam x 5 laps Toe raises with heels hanging off long foam x 20 BLE F/B gait on long foam x 5  laps  NEUROMUSCULAR RE-EDUCATION: To improve kinesthesia, proprioception, and balance. Carrying a tray with a cone with disc balanced on top around gym x 1 lap forward;  x 1 lap backward 4 step up and down with tray/cone/disc carry x 10  Large foam roll step overs with tray/cone/disc carry x 5 F/B;  X 5 S/S   11/22/24 Nustep L5x7min  Clock balance reaches randomly calling out each LE and color patient to follow directions Gait holding airex with cone sitting upright preventing cone from falling counting up by 2s to 50 then counted backward by 2s 3 lasp around gym CGA Sidestepping on balance beam with UE support CGA   [facility] PT Assessment - 11/22/24 0001       Berg Balance Test   Sit to Stand Able to stand  independently using hands    Standing Unsupported Able to stand safely 2 minutes    Sitting with Back Unsupported but Feet Supported on Floor or Stool Able to sit safely and securely 2 minutes    Stand to Sit Controls descent by using hands    Transfers Able to transfer safely, definite need of hands    Standing Unsupported with Eyes Closed Able to stand 10 seconds safely    Standing Unsupported with Feet Together Able to place feet together independently and stand 1 minute safely    From Standing, Reach Forward with Outstretched Arm Can reach confidently >25 cm (10)    From Standing Position, Pick up Object from Floor Able to pick up shoe safely and easily    From Standing Position, Turn to Look Behind Over each Shoulder Looks behind from both sides and weight shifts well    Turn 360 Degrees Able to turn 360 degrees safely but slowly    Standing Unsupported, Alternately Place Feet on Step/Stool Able to stand independently and complete 8 steps >20 seconds    Standing Unsupported, One Foot in Front Needs help to step but can hold 15 seconds    Standing on One Leg Tries to lift leg/unable to hold 3 seconds but remains standing independently    Total Score 44           11/19/24 THERAPEUTIC EXERCISE: To improve strength and endurance.  Demonstration, verbal and tactile cues throughout for technique. Bike L3 x 7'  NEUROMUSCULAR RE-EDUCATION: To improve proprioception and balance. [place] in front and behind x 40' x 4 Star/dot touch from 12-6:00 x 1 cycle each foot Cone touch from 9-3:00 calling out random colors x 4' Foam airex:  Weighted ball (yellow) basket toss forward x 1'  Weighted ball toss laterally x 1' R;  x 1' L  THERAPEUTIC ACTIVITIES: To improve functional performance.  Demonstration, verbal and tactile cues throughout for technique. Kick ball alternating feet with no UE support x 3' 6 step up and over and back x 5 F/B;   X 5 S/S    PATIENT EDUCATION:  Education details: HEP review  Person educated: Patient Education method: Explanation, Demonstration, Verbal cues, Tactile cues, and Handouts Education comprehension: verbalized understanding, verbal cues required, tactile cues required, and needs further education  HOME EXERCISE PROGRAM: Access Code: [email or web address] URL: [email or web address] Date: 10/28/2024 Prepared by: Suanne Minahan  Exercises - Tandem Stance in Corner  - 1 x daily - 7 x weekly - 3 sets - 10 reps - Single Leg Stance  - 1 x daily - 7 x weekly - 3 sets - 10 reps - Heel Raises with Counter Support  - 1 x daily - 7 x weekly - 3 sets - 10 reps - Toe Raises with Counter Support  - 1 x daily - 7 x weekly - 3 sets - 10 reps - Gastroc Stretch with Foot at Wall  - 1 x daily - 7 x weekly - 2 sets - 2 reps - 1 min hold  Patient Education - What You  Can Do to Prevent Falls   ASSESSMENT:  CLINICAL IMPRESSION: We continued to progress functional dual tasking and balance activities today. Pt was able to complete all of the interventions today. She did have LOB with stepping up and over with RLE while carrying the tray today, requiring mod A to recover. The long foam sidestepping was a little more challenging for  her today as well. She is progressing with multitasking and would suggest continued work on this along with dynamic balance.  RECERTIFICATION NOTE 11/15/24:  Vickie Burns has been seen for 9 PT visits over the last 2 months for unsteady gait/falls.    She is making progress to goals, but has not yet attained the goals that we have set for her.   However, she has good rehab potential to meet her goals.    Her FGA standardized balance assessment has improved from 13 to 16/30.   However her score still indicates that she is a high fall risk.   Her hip and ankle strength have improved by 1/2 to 1 full manual muscle test grade.   ABC scale for subjective balance confidence has improved from 48% to 55%.   Her gait speed, 5X sit to stand scores have been slower to improve, but we expect that they will improve since we have only had 9 visits.   The holiday break has also slowed us  down somewhat.    She is an excellent candidate for further PT and PT remains necessary for balance, gait, strength, safety, HEP deficits.   Continue per POC.    Recertification orders will be sent to MD for signature to continue  EVAL:  Jatasia Gundrum is a 84 y.o. female who was referred to physical therapy for evaluation and treatment for gait abnormality.  Patient presents with physical impairments of impaired activity tolerance, impaired standing balance, impaired ambulation, and decreased safety awareness impacting safe and independent functional mobility.  Examination revealed patient is at risk for falls and functional decline as evidenced by the following objective test measures: Gait speed 1.98 ft/sec, (2.62 ft/sec is needed for community access), BERG score of 43/56;  TUG of 11.6 sec (>13.5 sec indicates increased risk for falls), and 5xSTS of 18.96 sec (>15 sec indicates increased risk for falls and decreased BLE power).  ABC scale score of 48% indicates a moderate level of physical functioning.  Vickie Burns will benefit from skilled PT  to address above deficits to improve mobility and activity tolerance to help reach the maximal level of functional independence and mobility. Patient demonstrates understanding of this POC and is in agreement with this plan.   OBJECTIVE IMPAIRMENTS: difficulty walking, decreased strength, postural dysfunction, and pain.   ACTIVITY LIMITATIONS: carrying, lifting, bending, squatting, and locomotion level  PARTICIPATION LIMITATIONS: cleaning, laundry, shopping, and community activity  PERSONAL FACTORS: Age, Fitness, and 1-2 comorbidities: Afib, Diabetes, neuropathy, retinopathy, osteoporosis, h/o falls, h/o L breast CA  are also affecting patient's functional outcome.   REHAB POTENTIAL: Good  CLINICAL DECISION MAKING: Evolving/moderate complexity  EVALUATION COMPLEXITY: Moderate   GOALS: Goals reviewed with patient? Yes  SHORT TERM GOALS: Target date: 12/14/2024   Patient will be independent with advanced/ongoing HEP to facilitate ability to maintain/progress functional gains from skilled physical therapy services. Baseline: no advanced HEP yet 11/25/24:  patient can teach back HEP Goal status: MET-   3.  Patient will be able to step up/down curb safely with LRAD for safety with community ambulation.  Baseline: able to do 1 flight of steps  with 1 rail support 11/25/24:  able to do 4 step ups/downs while carrying a tray with SBA/CGA Goal status: IN PROGRESS-    4.  Patient will demonstrate improved  BLE strength to >/= 5/5 for improved stability and ease of mobility Baseline: Refer to above LE MMT table Goal status: IN PROGRESS- 11/15/24 see chart   LONG TERM GOALS: Target date: 01/11/2025     5.  Patient will improve 5xSTS time to </= 14 seconds for improved efficiency and safety with transfers. Baseline: 18.96 sec Goal status: IN PROGRESS- 11/15/24 20 seconds with UE support   6.  Patient will demonstrate gait speed of >/= 2.62 ft/sec (0.55 m/s) to be a safe limited community  ambulator with decreased risk for recurrent falls.  Baseline: 1.98 Goal status: IN PROGRESS- 1.25 m/s 10/21/24; 0.94 m/s  7.  Patient will improve Berg score to >/= 51/56 to improve safety and stability with ADLs in standing and reduce risk for falls. (MCID= 8 points)  Baseline: 43 11/22/24:  44/56 Goal status: IN PROGRESS  8.  Patient will demonstrate at least 19/24 on DGI to improve gait stability and reduce risk for falls. Baseline: TBD Goal status: INITIAL  9. Patient will improve FGA score to at least 19/30 to improve gait stability and reduce risk for falls. Baseline: TBD Goal status: IN PROGRESS- 11/15/24- 16/30  10.  Patient will report >/= 70% on ABC scale (MCID = 19%) to demonstrate improved balance confidence with functional mobility and gait. Baseline: 48% Goal status: IN PROGRESS- 10/21/24 880 / 1600 = 55.0 %   PLAN:  PT FREQUENCY: 1-2x/week  PT DURATION: 8 weeks  PLANNED INTERVENTIONS: [id number]- PT Re-evaluation, 97750- Physical Performance Testing, 97110-Therapeutic exercises, 97530- Therapeutic activity, 97112- Neuromuscular re-education, 97535- Self Care, [id number]- Manual therapy, [phone]- Gait training, Patient/Family education, Balance training, and Stair training  PLAN FOR NEXT SESSION:  Add cognitive challenges with bacwkard gait and tandem walk, foam marching/counting/carrying   Sol LITTIE Gaskins, PTA  11/29/2024, 12:36 PM   "

## 2024-11-30 ENCOUNTER — Other Ambulatory Visit: Payer: Self-pay

## 2024-11-30 DIAGNOSIS — C50011 Malignant neoplasm of nipple and areola, right female breast: Secondary | ICD-10-CM

## 2024-11-30 DIAGNOSIS — M8000XD Age-related osteoporosis with current pathological fracture, unspecified site, subsequent encounter for fracture with routine healing: Secondary | ICD-10-CM

## 2024-12-02 ENCOUNTER — Ambulatory Visit: Admitting: Rehabilitation

## 2024-12-02 ENCOUNTER — Encounter: Payer: Self-pay | Admitting: Rehabilitation

## 2024-12-02 DIAGNOSIS — R262 Difficulty in walking, not elsewhere classified: Secondary | ICD-10-CM | POA: Diagnosis not present

## 2024-12-02 DIAGNOSIS — M6281 Muscle weakness (generalized): Secondary | ICD-10-CM

## 2024-12-02 DIAGNOSIS — R293 Abnormal posture: Secondary | ICD-10-CM

## 2024-12-02 NOTE — Therapy (Signed)
 " OUTPATIENT PHYSICAL THERAPY NEURO TREATMENT   Patient Name: Vickie Burns MRN: [id number] DOB:21-Mar-1941, 84 y.o., female Today's Date: 12/02/2024   END OF SESSION:  PT End of Session - 12/02/24 1205     Visit Number 14    Date for Recertification  11/08/24    PT Start Time 1150    PT Stop Time 1230    PT Time Calculation (min) 40 min    Activity Tolerance Patient tolerated treatment well;No increased pain    Behavior During Therapy [facility] for tasks assessed/performed                    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   Essential hypertension 10/07/2016   Fall at home, initial encounter 04/05/2022   Family history of breast cancer    aunt   History of adenomatous polyp of colon 02/12/2021   HLD (hyperlipidemia) 08/17/2012   Hypercholesteremia 2000   Hypercholesterolemia 2000   Hypertension    history   Long term (current) use of anticoagulants 12/14/2021   Long term (current) use of insulin  (HCC) 02/12/2021   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   New onset a-fib (HCC) 06/28/2020   Osteoporosis 09/06/2016   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   Persistent atrial fibrillation (HCC) 06/07/2021   Personal history of malignant neoplasm of breast 02/12/2021   Personal history of radiation therapy    Postmenopausal    took HRT 1999 - 2004   Preop cardiovascular exam 07/03/2020   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    Traumatic ecchymosis of right lower leg 04/05/2022   Unspecified atrial fibrillation (HCC) 07/03/2020   Past Surgical History:  Procedure Laterality Date   BREAST BIOPSY     BREAST BIOPSY Left 10/29/2022   MM LT BREAST BX W LOC DEV 1ST LESION IMAGE BX SPEC STEREO GUIDE 10/29/2022 [facility]   BREAST EXCISIONAL BIOPSY Left 09/07/2020   BREAST LUMPECTOMY  04/2009   rt breast  estrogen +, Her 2 Nu negative   BREAST LUMPECTOMY WITH RADIOACTIVE SEED LOCALIZATION Left 09/07/2020   Procedure: RADIOCATIVE SEED GUIDED LEFT BREAST LUMPECTOMY;  Surgeon: Ethyl Lenis, MD;  Location: [facility];  Service: General;  Laterality: Left;   COLONOSCOPY WITH PROPOFOL  N/A 04/05/2014   Procedure: COLONOSCOPY WITH PROPOFOL ;  Surgeon: Gladis MARLA Louder, MD;  Location: [facility];  Service: Endoscopy;  Laterality: N/A;   ORIF FOOT FRACTURE Right 04/20/2014   done in [facility] TOOTH EXTRACTION     Patient Active Problem List   Diagnosis Date Noted   Nuclear sclerotic cataract of left eye 02/26/2024   Osteopenia of left forearm 12/09/2023   Well woman exam with routine gynecological exam 12/04/2023   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   Acute blood loss anemia 04/05/2022   Traumatic ecchymosis of right lower leg 04/05/2022   Fall at home, initial encounter 04/05/2022   Long term (current) use of anticoagulants 12/14/2021   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   Coronary arteriosclerosis 09/25/2021  Persistent atrial fibrillation (HCC) 06/07/2021   Diabetic peripheral neuropathy associated with type 2 diabetes mellitus (HCC) 02/12/2021   Diabetic retinopathy associated with type 2 diabetes mellitus (HCC) 02/12/2021   History of adenomatous polyp of colon 02/12/2021   Long term (current) use of insulin  (HCC) 02/12/2021   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   Personal history of malignant neoplasm of breast 02/12/2021   Atrial fibrillation (HCC) 02/12/2021   Family  history of breast cancer    Hypertension    Personal history of radiation therapy    Postmenopausal    Diabetes mellitus due to underlying condition with unspecified complications (HCC) 07/03/2020   Unspecified atrial fibrillation (HCC) 07/03/2020   New onset a-fib (HCC) 06/28/2020   Essential hypertension 10/07/2016   Osteoporosis 09/06/2016   Macular degeneration 2016   HLD (hyperlipidemia) 08/17/2012   Diabetes mellitus, type 2 (HCC) 01/27/2012   Breast CA (HCC) 12/20/2011   Hypercholesterolemia 2000   Hypercholesteremia 2000    PCP: Frann Mabel Mt, DO   REFERRING PROVIDER: Frann Mabel Mt, DO   REFERRING DIAG: R26.9 (ICD-10-CM) - Gait abnormality  THERAPY DIAG:  Difficulty in walking, not elsewhere classified  Muscle weakness (generalized)  Abnormal posture  RATIONALE FOR EVALUATION AND TREATMENT: Rehabilitation  ONSET DATE: >1 year, but worsening progressively over last few months  NEXT MD VISIT:    SUBJECTIVE:                                                                                                                                                                                                         SUBJECTIVE STATEMENT: Patient states feels well today.   She is ambulating into clinic with no device with better gait pattern and visibly improved balance  84 y/o female referred to PT from PCP for gait abnormality.   She is also having some memory deficits and brain MRI is pending.  She is also supposed to see a neurologist  Pt accompanied by: self  PAIN: Are you having pain? No  PERTINENT HISTORY:  Afib, Diabetes, neuropathy, retinopathy, osteoporosis, h/o falls, h/o L breast CA   PRECAUTIONS: Other: L breast surgery/lumpectomy  RED FLAGS: None  WEIGHT BEARING RESTRICTIONS: No  FALLS:  Has patient fallen in last 6 months? Yes. Number of falls several  LIVING ENVIRONMENT: Lives with: lives alone Lives in:  House/apartment Stairs: Yes: Internal: 14 steps; on right going up and External: 6 steps; bilateral but cannot reach both Has following equipment at home: Single point cane, Walker - 2 wheeled, bed side commode, and Grab bars  OCCUPATION:  works for [employer] teacher  PLOF: Independent with gait  PATIENT GOALS: have better balance   OBJECTIVE: (objective measures completed at initial evaluation unless otherwise dated)  DIAGNOSTIC FINDINGS:  Brain MRI is pending  COGNITION: Overall cognitive status: Within functional limits for tasks assessed   SENSATION: WFL  COORDINATION: WNL  EDEMA:  Mild BLE  MUSCLE TONE: WNL  DTRs:  NT  POSTURE:  rounded shoulders and forward head     LOWER EXTREMITY ROM:    MMT Right eval Left eval  Hip flexion    Hip extension    Hip abduction    Hip adduction    Hip internal rotation    Hip external rotation    Knee flexion    Knee extension    Ankle dorsiflexion    Ankle plantarflexion    Ankle inversion    Ankle eversion    (Blank rows = not tested) LOWER EXTREMITY MMT:     Active  Right eval Left eval R 11/15/24 L 11/15/24  Hip flexion 4 4 4+ 4+  Hip extension      Hip abduction 4 4 5- sitting 5 sitting  Hip adduction      Hip internal rotation 4+ 4    Hip external rotation 4 4+    Knee flexion 5 5 4+ 4+  Knee extension 5 5 4+ 4+  Ankle dorsiflexion 4 4- 4+ 4  Ankle plantarflexion      Ankle inversion      Ankle eversion       (Blank rows = not tested) BED MOBILITY:  Rolling to Right CGA Rolling to Left Complete Independence  TRANSFERS: Assistive device utilized: None  Sit to stand: Modified independence Stand to sit: Modified independence Chair to chair: Modified independence Floor: NT  GAIT: Distance walked:  clinic distances and into clinic from parking lot Assistive device utilized: None Level of assistance: SBA Gait pattern: decreased step length- Right, decreased step length- Left, decreased stance  time- Right, decreased stance time- Left, decreased ankle dorsiflexion- Right, and decreased ankle dorsiflexion- Left Comments:   FUNCTIONAL TESTS:  PHYSICAL PERFORMANCE TEST or MEASUREMENT:   [facility] PT Assessment - 10/21/24 0001       Functional Gait  Assessment   Gait assessed  Yes    Gait Level Surface Walks 20 ft, slow speed, abnormal gait pattern, evidence for imbalance or deviates 10-15 in outside of the 12 in walkway width. Requires more than 7 sec to ambulate 20 ft.    Change in Gait Speed Makes only minor adjustments to walking speed, or accomplishes a change in speed with significant gait deviations, deviates 10-15 in outside the 12 in walkway width, or changes speed but loses balance but is able to recover and continue walking.    Gait with Horizontal Head Turns Performs head turns with moderate changes in gait velocity, slows down, deviates 10-15 in outside 12 in walkway width but recovers, can continue to walk.    Gait with Vertical Head Turns Performs task with slight change in gait velocity (eg, minor disruption to smooth gait path), deviates 6 - 10 in outside 12 in walkway width or uses assistive device    Gait and Pivot Turn Pivot turns safely within 3 sec and stops quickly with no loss of balance.    Step Over Obstacle Is able to step over one shoe box (4.5 in total height) without changing gait speed. No evidence of imbalance.    Gait with Narrow Base of Support  Ambulates less than 4 steps heel to toe or cannot perform without assistance.    Gait with Eyes Closed Walks 20 ft, slow speed, abnormal gait pattern, evidence for imbalance, deviates 10-15 in outside 12 in walkway width. Requires more than 9 sec to ambulate 20 ft.    Ambulating Backwards Walks 20 ft, slow speed, abnormal gait pattern, evidence for imbalance, deviates 10-15 in outside 12 in walkway width.    Steps Two feet to a stair, must use rail.    Total Score 13           Gait speed:  1.98 ft/sec 5x STS =  18.96 sec TUG score = 11.6 sec   PATIENT SURVEYS:  ABC scale: The Activities-Specific Balance Confidence (ABC) Scale 0% 10 20 30  40 50 60 70 80 90 100% No confidence<->completely confident  How confident are you that you will not lose your balance or become unsteady when you . . .   Date tested 12/02/2024       10/21/24  Walk around the house 70%   2. Walk up or down stairs 70%   3. Bend over and pick up a slipper from in front of a closet floor 80%   4. Reach for a small can off a shelf at eye level 100%   5. Stand on tip toes and reach for something above your head 40%   6. Stand on a chair and reach for something 0%   7. Sweep the floor 80%   8. Walk outside the house to a car parked in the driveway 80%   9. Get into or out of a car 100%   10. Walk across a parking lot to the mall 80%   11. Walk up or down a ramp 0%   12. Walk in a crowded mall where people rapidly walk past you 80%   13. Are bumped into by people as you walk through the mall 0%   14. Step onto or off of an escalator while you are holding onto the railing 8011%   15. Step onto or off an escalator while holding onto parcels such that you cannot hold onto the railing 0%   16. Walk outside on icy sidewalks 0%   Total: #/16 780/1600 = 48% 880 / 1600 = 55.0 %      TODAY'S TREATMENT:  THERAPEUTIC EXERCISE: To improve strength and endurance.  Demonstration, verbal and tactile cues throughout for technique. NuStep L5 x 7'  NEUROMUSCULAR RE-EDUCATION: To improve posture, proprioception, and balance. Backward walking carrying tray balancing a cone and slider while counting forward by 1's, 2's, 3's x 150' Backward walking recalling animals starting with a letter of the alphabet x 150' Obstable course step overs with foam rollers counting forward by 4's (3 rollers)  F/B X 5 LAPS;   S/S X 5 laps [place]arioca steps naming animals starting with a letter x 5' Tandem gait backward counting by 3's x 5'    11/29/24 Bike  L3x73min Seated ball squeeze with LAQ BLE x 12  Carrying a tray with a cone with disc balanced on top around gym x 1 lap; 1 additional lap while holding only LUE 4 step up and down with tray/cone/disc carry x 10  Step up and over 4 step BLE x 10 while carrying tray Sidestepping along balance beam 3x each way- more LOB today Sit to stand x 5 - airex pad minimal UE use  11/25/24 THERAPEUTIC ACTIVITIES: To improve functional performance.  Demonstration,  verbal and tactile cues throughout for technique. Sit to stand on long foam x 2/5  Sidestepping on long foam x 5 laps Toe raises with heels hanging off long foam x 20 BLE F/B gait on long foam x 5 laps  NEUROMUSCULAR RE-EDUCATION: To improve kinesthesia, proprioception, and balance. Carrying a tray with a cone with disc balanced on top around gym x 1 lap forward;  x 1 lap backward 4 step up and down with tray/cone/disc carry x 10  Large foam roll step overs with tray/cone/disc carry x 5 F/B;  X 5 S/S   11/22/24 Nustep L5x31min  Clock balance reaches randomly calling out each LE and color patient to follow directions Gait holding airex with cone sitting upright preventing cone from falling counting up by 2s to 50 then counted backward by 2s 3 lasp around gym CGA Sidestepping on balance beam with UE support CGA   [facility] PT Assessment - 11/22/24 0001       Berg Balance Test   Sit to Stand Able to stand  independently using hands    Standing Unsupported Able to stand safely 2 minutes    Sitting with Back Unsupported but Feet Supported on Floor or Stool Able to sit safely and securely 2 minutes    Stand to Sit Controls descent by using hands    Transfers Able to transfer safely, definite need of hands    Standing Unsupported with Eyes Closed Able to stand 10 seconds safely    Standing Unsupported with Feet Together Able to place feet together independently and stand 1 minute safely    From Standing, Reach Forward with Outstretched Arm Can  reach confidently >25 cm (10)    From Standing Position, Pick up Object from Floor Able to pick up shoe safely and easily    From Standing Position, Turn to Look Behind Over each Shoulder Looks behind from both sides and weight shifts well    Turn 360 Degrees Able to turn 360 degrees safely but slowly    Standing Unsupported, Alternately Place Feet on Step/Stool Able to stand independently and complete 8 steps >20 seconds    Standing Unsupported, One Foot in Front Needs help to step but can hold 15 seconds    Standing on One Leg Tries to lift leg/unable to hold 3 seconds but remains standing independently    Total Score 44          11/19/24 THERAPEUTIC EXERCISE: To improve strength and endurance.  Demonstration, verbal and tactile cues throughout for technique. Bike L3 x 7'  NEUROMUSCULAR RE-EDUCATION: To improve proprioception and balance. Carioca in front and behind x 40' x 4 Star/dot touch from 12-6:00 x 1 cycle each foot Cone touch from 9-3:00 calling out random colors x 4' Foam airex:  Weighted ball (yellow) basket toss forward x 1'  Weighted ball toss laterally x 1' R;  x 1' L  THERAPEUTIC ACTIVITIES: To improve functional performance.  Demonstration, verbal and tactile cues throughout for technique. Kick ball alternating feet with no UE support x 3' 6 step up and over and back x 5 F/B;   X 5 S/S    PATIENT EDUCATION:  Education details: HEP review  Person educated: Patient Education method: Explanation, Demonstration, Verbal cues, Tactile cues, and Handouts Education comprehension: verbalized understanding, verbal cues required, tactile cues required, and needs further education  HOME EXERCISE PROGRAM: Access Code: [email or web address] URL: [email or web address] Date: 10/28/2024 Prepared by: Braylin Clark  Exercises - Tandem Stance in Corner  - 1  x daily - 7 x weekly - 3 sets - 10 reps - Single Leg Stance  - 1 x daily - 7 x weekly - 3 sets - 10 reps - Heel  Raises with Counter Support  - 1 x daily - 7 x weekly - 3 sets - 10 reps - Toe Raises with Counter Support  - 1 x daily - 7 x weekly - 3 sets - 10 reps - Gastroc Stretch with Foot at Wall  - 1 x daily - 7 x weekly - 2 sets - 2 reps - 1 min hold  Patient Education - What You Can Do to Prevent Falls   ASSESSMENT:  CLINICAL IMPRESSION: Patient is doing better with carrying a tray balancing objects while walking with cognitive challenges.   She only drops the objects x 5 or 6 times today.   However, her gait speed is much slower when she is carrying/doing cognition challenges.   She will f/u with neurologist soon for MMSE.    PT remains necessary for balance, gait deficits.   Continue per POC  RECERTIFICATION NOTE 11/15/24:  Vickie Burns has been seen for 9 PT visits over the last 2 months for unsteady gait/falls.    She is making progress to goals, but has not yet attained the goals that we have set for her.   However, she has good rehab potential to meet her goals.    Her FGA standardized balance assessment has improved from 13 to 16/30.   However her score still indicates that she is a high fall risk.   Her hip and ankle strength have improved by 1/2 to 1 full manual muscle test grade.   ABC scale for subjective balance confidence has improved from 48% to 55%.   Her gait speed, 5X sit to stand scores have been slower to improve, but we expect that they will improve since we have only had 9 visits.   The holiday break has also slowed us  down somewhat.    She is an excellent candidate for further PT and PT remains necessary for balance, gait, strength, safety, HEP deficits.   Continue per POC.    Recertification orders will be sent to MD for signature to continue  EVAL:  Vickie Burns is a 84 y.o. female who was referred to physical therapy for evaluation and treatment for gait abnormality.  Patient presents with physical impairments of impaired activity tolerance, impaired standing balance, impaired  ambulation, and decreased safety awareness impacting safe and independent functional mobility.  Examination revealed patient is at risk for falls and functional decline as evidenced by the following objective test measures: Gait speed 1.98 ft/sec, (2.62 ft/sec is needed for community access), BERG score of 43/56;  TUG of 11.6 sec (>13.5 sec indicates increased risk for falls), and 5xSTS of 18.96 sec (>15 sec indicates increased risk for falls and decreased BLE power).  ABC scale score of 48% indicates a moderate level of physical functioning.  Vickie Burns will benefit from skilled PT to address above deficits to improve mobility and activity tolerance to help reach the maximal level of functional independence and mobility. Patient demonstrates understanding of this POC and is in agreement with this plan.   OBJECTIVE IMPAIRMENTS: difficulty walking, decreased strength, postural dysfunction, and pain.   ACTIVITY LIMITATIONS: carrying, lifting, bending, squatting, and locomotion level  PARTICIPATION LIMITATIONS: cleaning, laundry, shopping, and community activity  PERSONAL FACTORS: Age, Fitness, and 1-2 comorbidities: Afib, Diabetes, neuropathy, retinopathy, osteoporosis, h/o falls, h/o L breast CA  are also affecting patient's functional outcome.   REHAB POTENTIAL: Good  CLINICAL DECISION MAKING: Evolving/moderate complexity  EVALUATION COMPLEXITY: Moderate   GOALS: Goals reviewed with patient? Yes  SHORT TERM GOALS: Target date: 12/14/2024   Patient will be independent with advanced/ongoing HEP to facilitate ability to maintain/progress functional gains from skilled physical therapy services. Baseline: no advanced HEP yet 11/25/24:  patient can teach back HEP Goal status: MET-   3.  Patient will be able to step up/down curb safely with LRAD for safety with community ambulation.  Baseline: able to do 1 flight of steps with 1 rail support 11/25/24:  able to do 4 step ups/downs while carrying a  tray with SBA/CGA Goal status: IN PROGRESS-    4.  Patient will demonstrate improved  BLE strength to >/= 5/5 for improved stability and ease of mobility Baseline: Refer to above LE MMT table Goal status: IN PROGRESS- 11/15/24 see chart   LONG TERM GOALS: Target date: 01/11/2025     5.  Patient will improve 5xSTS time to </= 14 seconds for improved efficiency and safety with transfers. Baseline: 18.96 sec Goal status: IN PROGRESS- 11/15/24 20 seconds with UE support   6.  Patient will demonstrate gait speed of >/= 2.62 ft/sec (0.55 m/s) to be a safe limited community ambulator with decreased risk for recurrent falls.  Baseline: 1.98 Goal status: IN PROGRESS- 1.25 m/s 10/21/24; 0.94 m/s  7.  Patient will improve Berg score to >/= 51/56 to improve safety and stability with ADLs in standing and reduce risk for falls. (MCID= 8 points)  Baseline: 43 11/22/24:  44/56 Goal status: IN PROGRESS  8.  Patient will demonstrate at least 19/24 on DGI to improve gait stability and reduce risk for falls. Baseline: TBD Goal status: INITIAL  9. Patient will improve FGA score to at least 19/30 to improve gait stability and reduce risk for falls. Baseline: TBD Goal status: IN PROGRESS- 11/15/24- 16/30  10.  Patient will report >/= 70% on ABC scale (MCID = 19%) to demonstrate improved balance confidence with functional mobility and gait. Baseline: 48% Goal status: IN PROGRESS- 10/21/24 880 / 1600 = 55.0 %   PLAN:  PT FREQUENCY: 1-2x/week  PT DURATION: 8 weeks  PLANNED INTERVENTIONS: [id number]- PT Re-evaluation, 97750- Physical Performance Testing, 97110-Therapeutic exercises, 97530- Therapeutic activity, 97112- Neuromuscular re-education, 97535- Self Care, [id number]- Manual therapy, [phone]- Gait training, Patient/Family education, Balance training, and Stair training  PLAN FOR NEXT SESSION:  Add cognitive challenges with bacwkard gait and tandem walk, foam marching/counting/carrying   Vickie Burns, PT   12/02/2024, 1:13 PM   "

## 2024-12-08 ENCOUNTER — Other Ambulatory Visit: Payer: Self-pay

## 2024-12-08 DIAGNOSIS — C50011 Malignant neoplasm of nipple and areola, right female breast: Secondary | ICD-10-CM

## 2024-12-09 ENCOUNTER — Inpatient Hospital Stay: Admitting: Hematology & Oncology

## 2024-12-09 ENCOUNTER — Telehealth: Payer: Self-pay | Admitting: Hematology & Oncology

## 2024-12-09 ENCOUNTER — Inpatient Hospital Stay

## 2024-12-09 NOTE — Telephone Encounter (Signed)
 Returning voicemail: Called to r/s pt's appts. LVM to return call for scheduling.

## 2024-12-15 ENCOUNTER — Ambulatory Visit: Admitting: Rehabilitation

## 2024-12-23 ENCOUNTER — Ambulatory Visit: Admitting: Rehabilitation

## 2024-12-27 ENCOUNTER — Ambulatory Visit: Admitting: Neurology

## 2025-04-27 ENCOUNTER — Ambulatory Visit: Admitting: Neurology

## 2025-07-25 ENCOUNTER — Encounter: Admitting: Family Medicine

## 2025-07-28 ENCOUNTER — Ambulatory Visit
